# Patient Record
Sex: Male | Born: 1980 | Race: White | Hispanic: No | Marital: Single | Smoking: Former smoker
Health system: Southern US, Community
[De-identification: ages and names within clinical notes are randomized; demographics above are authoritative.]

## PROBLEM LIST (undated history)

## (undated) DIAGNOSIS — F419 Anxiety disorder, unspecified: Secondary | ICD-10-CM

## (undated) DIAGNOSIS — F410 Panic disorder [episodic paroxysmal anxiety] without agoraphobia: Secondary | ICD-10-CM

## (undated) DIAGNOSIS — F32A Depression, unspecified: Secondary | ICD-10-CM

## (undated) DIAGNOSIS — F329 Major depressive disorder, single episode, unspecified: Secondary | ICD-10-CM

## (undated) DIAGNOSIS — E7800 Pure hypercholesterolemia, unspecified: Secondary | ICD-10-CM

## (undated) DIAGNOSIS — I10 Essential (primary) hypertension: Secondary | ICD-10-CM

## (undated) DIAGNOSIS — G629 Polyneuropathy, unspecified: Secondary | ICD-10-CM

## (undated) HISTORY — DX: Major depressive disorder, single episode, unspecified: F32.9

## (undated) HISTORY — DX: Pure hypercholesterolemia, unspecified: E78.00

## (undated) HISTORY — DX: Anxiety disorder, unspecified: F41.9

## (undated) HISTORY — DX: Essential (primary) hypertension: I10

## (undated) HISTORY — DX: Depression, unspecified: F32.A

---

## 2005-11-23 ENCOUNTER — Emergency Department: Payer: Self-pay | Admitting: Emergency Medicine

## 2007-09-03 ENCOUNTER — Ambulatory Visit: Payer: Self-pay | Admitting: Internal Medicine

## 2007-10-01 ENCOUNTER — Ambulatory Visit: Payer: Self-pay | Admitting: Internal Medicine

## 2008-04-20 ENCOUNTER — Ambulatory Visit: Payer: Self-pay | Admitting: Family Medicine

## 2008-07-12 ENCOUNTER — Ambulatory Visit: Payer: Self-pay | Admitting: Family Medicine

## 2008-12-19 ENCOUNTER — Ambulatory Visit: Payer: Self-pay | Admitting: Internal Medicine

## 2009-10-25 ENCOUNTER — Emergency Department: Payer: Self-pay | Admitting: Emergency Medicine

## 2011-02-19 ENCOUNTER — Inpatient Hospital Stay: Payer: Self-pay | Admitting: Psychiatry

## 2011-02-20 DIAGNOSIS — Z0389 Encounter for observation for other suspected diseases and conditions ruled out: Secondary | ICD-10-CM

## 2012-09-04 ENCOUNTER — Ambulatory Visit: Payer: Self-pay | Admitting: Family Medicine

## 2012-10-05 ENCOUNTER — Ambulatory Visit: Payer: Self-pay | Admitting: Family Medicine

## 2012-10-05 LAB — RAPID STREP-A WITH REFLX: Micro Text Report: NEGATIVE

## 2014-07-21 ENCOUNTER — Other Ambulatory Visit: Payer: Self-pay | Admitting: Psychiatry

## 2014-11-05 ENCOUNTER — Encounter: Payer: Self-pay | Admitting: Psychiatry

## 2014-11-05 ENCOUNTER — Ambulatory Visit (INDEPENDENT_AMBULATORY_CARE_PROVIDER_SITE_OTHER): Payer: Self-pay | Admitting: Psychiatry

## 2014-11-05 VITALS — BP 120/90 | HR 60 | Resp 12 | Ht 73.0 in

## 2014-11-05 DIAGNOSIS — F331 Major depressive disorder, recurrent, moderate: Secondary | ICD-10-CM

## 2014-11-05 MED ORDER — DIAZEPAM 5 MG PO TABS: ORAL_TABLET | ORAL | Status: DC

## 2014-11-05 MED ORDER — ZALEPLON 10 MG PO CAPS: 10.0000 mg | ORAL_CAPSULE | Freq: Every evening | ORAL | Status: DC | PRN

## 2014-11-05 MED ORDER — CITALOPRAM HYDROBROMIDE 40 MG PO TABS: 40.0000 mg | ORAL_TABLET | Freq: Every day | ORAL | Status: DC

## 2014-11-05 MED ORDER — QUETIAPINE FUMARATE 400 MG PO TABS: 400.0000 mg | ORAL_TABLET | Freq: Every day | ORAL | Status: DC

## 2014-11-05 NOTE — Progress Notes (Signed)
BH MD/PA/NP OP Progress Note  11/05/2014 10:02 AM Ian [name]  MRN:  [id number]  Subjective:  Ian [name] returns a follow-up is major depressive disorder, recurrent, moderate. He states since we increased his Celexa from 20 [place] today to 40 mg at the last visit he notes his mood is "a lot better." He states he is now engaging more being a little more involved in things and somewhat able to enjoy things more.  He states his biggest issue right now is difficulty sleeping. He states that the trazodone does not help him fall asleep but helps him stay asleep. He states he thinks the Seroquel is the most helpful in terms of helping him sleep but he would like to eventually get off this medication as he is concerned with the weight gain he has had on the medication. We have discussed that we might do to try other sleep medications. He states Ambien was not effective for him in the past. We discussed perhaps trying Sonata in place of the trazodone and seeing if we successful with another sleep agent then we might be able to wean him off Seroquel.  Chief Complaint: sleep Visit Diagnosis:  No diagnosis found.  Past Medical History: No past medical history on file. No past surgical history on file. Family History: No family history on file. Social History:  History   Social History  . Marital Status: Single    Spouse Name: N/A  . Number of Children: N/A  . Years of Education: N/A   Social History Main Topics  . Smoking status: Not on file  . Smokeless tobacco: Not on file  . Alcohol Use: Not on file  . Drug Use: Not on file  . Sexual Activity: No   Other Topics Concern  . Not on file   Social History Narrative  . No narrative on file   Additional History:   Assessment:   Musculoskeletal: Strength & Muscle Tone: within normal limits Gait & Station: normal Patient leans: N/A  Psychiatric Specialty Exam: HPI  Review of Systems  Psychiatric/Behavioral: Negative for depression,  suicidal ideas, hallucinations, memory loss and substance abuse. The patient has insomnia. The patient is not nervous/anxious.     Blood pressure 120/90, pulse 60, resp. rate 12, height 6\' 1"  (1.854 m).There is no weight on file to calculate BMI.  General Appearance: Well Groomed  Eye Contact:  Good  Speech:  Clear and Coherent and Normal Rate  Volume:  Normal  Mood:  A lot better  Affect:  Congruent  Thought Process:  Linear and Logical  Orientation:  Full (Time, Place, and Person)  Thought Content:  Negative  Suicidal Thoughts:  No  Homicidal Thoughts:  No  Memory:  Immediate;   Good Recent;   Good Remote;   Good  Judgement:  Good  Insight:  Good  Psychomotor Activity:  Negative  Concentration:  Good  Recall:  Good  Fund of Knowledge: Good  Language: Good  Akathisia:  Negative  Handed:  Rightunknown  AIMS (if indicated):  Not done  Assets:  Communication Skills Desire for Improvement Social Support  ADL's:  Intact  Cognition: WNL  Sleep:  Poor, good with medication   Is the patient at risk to self?  No. Has the patient been a risk to self in the past 6 months?  No. Has the patient been a risk to self within the distant past?  No. Is the patient a risk to others?  No. Has the patient been a  risk to others in the past 6 months?  No. Has the patient been a risk to others within the distant past?  No.  Current Medications: Current Outpatient Prescriptions  Medication Sig Dispense Refill  . citalopram (CELEXA) 40 MG tablet Take 1 tablet (40 mg total) by mouth daily. 30 tablet 3  . QUEtiapine (SEROQUEL) 400 MG tablet Take 1 tablet (400 mg total) by mouth at bedtime. 30 tablet 3  . traZODone (DESYREL) 100 MG tablet Take 200 mg by mouth at bedtime.    . diazepam (VALIUM) 5 MG tablet Take 2 tablets in the morning, 2 tablets in the afternoon and 1 tablet at bedtime 150 tablet 1  . zaleplon (SONATA) 10 MG capsule Take 1 capsule (10 mg total) by mouth at bedtime as needed for  sleep. 30 capsule 1   No current facility-administered medications for this visit.    Medical Decision Making:  Established Problem, Stable/Improving (1) patient is doing well on the current regimen. His only issue right now is insomnia and a desire to adjust the sleep regimen to decrease side effects from weight gain related to Seroquel and find alternative medications to aid him in his insomnia.  Treatment Plan Summary:Medication management We will continue his diazepam at 10 mg in the morning, 10 mg in the afternoon and 5 mg at bedtime. We'll continue his citalopram at 40 mg daily. We will try Sonata 10 mg at bedtime as needed for insomnia. He will discontinue his trazodone and see if he can get a response from the [facility]. He will follow up in 1 month.  Ian [name] 11/05/2014, 10:02 AM

## 2014-12-08 ENCOUNTER — Encounter: Payer: Self-pay | Admitting: Psychiatry

## 2014-12-08 ENCOUNTER — Ambulatory Visit (INDEPENDENT_AMBULATORY_CARE_PROVIDER_SITE_OTHER): Payer: Self-pay | Admitting: Psychiatry

## 2014-12-08 VITALS — BP 142/98 | HR 118 | Temp 98.3°F | Ht 73.0 in | Wt 357.4 lb

## 2014-12-08 DIAGNOSIS — F331 Major depressive disorder, recurrent, moderate: Secondary | ICD-10-CM

## 2014-12-08 DIAGNOSIS — F4001 Agoraphobia with panic disorder: Secondary | ICD-10-CM

## 2014-12-08 MED ORDER — TRAZODONE HCL 100 MG PO TABS: 200.0000 mg | ORAL_TABLET | Freq: Every evening | ORAL | Status: DC | PRN

## 2014-12-08 MED ORDER — DIAZEPAM 5 MG PO TABS: 5.0000 mg | ORAL_TABLET | Freq: Four times a day (QID) | ORAL | Status: DC | PRN

## 2014-12-08 NOTE — Progress Notes (Signed)
BH MD/PA/NP OP Progress Note  12/08/2014 10:39 AM Ian [name]  MRN:  [id number]  Subjective:  Patient returns for follow-up of his major depressive disorder and anxiety. He states that overall he does feel like his depression has improved and is somewhat stable. He states he has issues with his [place] phobia and surmises he is just an "introvert." He states he's been more active around his house. He states he is able to enjoy things. He states he is sleeping fairly well however he did not find Sonata helped him much with his insomnia so he used to prior refill with trazodone and feels this medication has worked well for him. He estimates he sleeping about 8 hours a night.  Cuss his Valium and I indicated ultimately like him to be on a lower dose. Thus we admitted decision that we will decrease his dose from 25 mg total a day to 20 mg total day. He will have 5 mg tablets and will be instructed to take 14 times a day as needed for his anxiety.  Chief Complaint: sleep Visit Diagnosis:  No diagnosis found.  Past Medical History:  Past Medical History  Diagnosis Date  . Hypertension   . Hypercholesteremia   . Anxiety   . Depression    History reviewed. No pertinent past surgical history. Family History:  Family History  Problem Relation Age of Onset  . Heart attack Mother   . Hypertension Mother   . Hypercholesterolemia Mother   . Heart attack Father   . Hypertension Father   . Hypercholesterolemia Father   . Depression Maternal Uncle   . Suicidality Maternal Uncle   . Depression Cousin   . Suicidality Cousin    Social History:  History   Social History  . Marital Status: Single    Spouse Name: N/A  . Number of Children: N/A  . Years of Education: N/A   Social History Main Topics  . Smoking status: Current Every Day Smoker -- 0.75 packs/day for 16 years    Types: Cigarettes  . Smokeless tobacco: Not on file  . Alcohol Use: No  . Drug Use: No  . Sexual Activity: No    Other Topics Concern  . None   Social History Narrative   Additional History:   Assessment:   Musculoskeletal: Strength & Muscle Tone: within normal limits Gait & Station: normal Patient leans: N/A  Psychiatric Specialty Exam: HPI  Review of Systems  Psychiatric/Behavioral: Negative for depression, suicidal ideas, hallucinations, memory loss and substance abuse. The patient has insomnia. The patient is not nervous/anxious.     Blood pressure 140/92, pulse 118, temperature 98.3 F (36.8 C), temperature source Tympanic, height 6\' 1"  (1.854 m), weight 357 lb 6.4 oz (162.116 kg), SpO2 93 %.Body mass index is 47.16 kg/(m^2).  General Appearance: Well Groomed  Eye Contact:  Good  Speech:  Clear and Coherent and Normal Rate  Volume:  Normal  Mood:  A lot better  Affect:  Congruent  Thought Process:  Linear and Logical  Orientation:  Full (Time, Place, and Person)  Thought Content:  Negative  Suicidal Thoughts:  No  Homicidal Thoughts:  No  Memory:  Immediate;   Good Recent;   Good Remote;   Good  Judgement:  Good  Insight:  Good  Psychomotor Activity:  Negative  Concentration:  Good  Recall:  Good  Fund of Knowledge: Good  Language: Good  Akathisia:  Negative  Handed:  Rightunknown  AIMS (if indicated):  Not  done  Assets:  Communication Skills Desire for Improvement Social Support  ADL's:  Intact  Cognition: WNL  Sleep:  Poor, good with medication   Is the patient at risk to self?  No. Has the patient been a risk to self in the past 6 months?  No. Has the patient been a risk to self within the distant past?  No. Is the patient a risk to others?  No. Has the patient been a risk to others in the past 6 months?  No. Has the patient been a risk to others within the distant past?  No.  Current Medications: Current Outpatient Prescriptions  Medication Sig Dispense Refill  . citalopram (CELEXA) 40 MG tablet Take 1 tablet (40 mg total) by mouth daily. 30 tablet 3   . diazepam (VALIUM) 5 MG tablet Take 1 tablet (5 mg total) by mouth every 6 (six) hours as needed for anxiety. 120 tablet 1  . QUEtiapine (SEROQUEL) 400 MG tablet Take 1 tablet (400 mg total) by mouth at bedtime. 30 tablet 3  . traZODone (DESYREL) 100 MG tablet Take 2 tablets (200 mg total) by mouth at bedtime as needed for sleep. 60 tablet 2  . zaleplon (SONATA) 10 MG capsule Take 1 capsule (10 mg total) by mouth at bedtime as needed for sleep. (Patient not taking: Reported on 12/08/2014) 30 capsule 1   No current facility-administered medications for this visit.    Medical Decision Making:  Established Problem, Stable/Improving (1) patient is doing well on the current regimen. His only issue right now is insomnia and a desire to adjust the sleep regimen to decrease side effects from weight gain related to Seroquel and find alternative medications to aid him in his insomnia.  Treatment Plan Summary:Medication management We will decrease his diazepam to 5 mg 4 times a day as needed. We'll continue his citalopram at 40 mg daily. We'll continue his Seroquel 400 mg at bedtime. He is a very discontinued the Sonata. I will restart him back on his prior dose of trazodone which was 200 mg at bedtime. He will follow-up in 2 months. He has 2 refills remaining on all of his medications with the exception of needing his trazodone. I will order a 30 day supply of trazodone with 2 refills. I will also give him a new prescription for his diazepam at the lower dose.  Patient has notable mildly elevated blood pressure. We have given him numerous referrals to primary care offices to have this followed up and assess. Patient is aware he needs to connect with a primary care and mentioned in this appointment that financially he is trying to make that happen and that following up here in 2 months makes that more likely a possibility for him.  Wallace Going 12/08/2014, 10:39 AM

## 2014-12-10 ENCOUNTER — Telehealth: Payer: Self-pay

## 2014-12-10 NOTE — Telephone Encounter (Signed)
called pt to follow up with his blood pressure being hight on his visit 12-08-14. at that visit I did advise pt to try several doctor office in the area that worked on a sliding fee since pt does not have insurance. pt was advised that he should see someone about his blood pressure. Pt BP was 140/ 92  And 142/98. Pt was strongly encouraged to check and keep a record of his blood pressure reading

## 2014-12-15 NOTE — Telephone Encounter (Signed)
left message to call back. concern about bp issues and wonder if pt found a pcp to go too.

## 2015-01-13 ENCOUNTER — Telehealth: Payer: Self-pay | Admitting: Psychiatry

## 2015-01-13 NOTE — Telephone Encounter (Signed)
pt was called and informed that letter was ready and at the front desk

## 2015-02-09 ENCOUNTER — Ambulatory Visit (INDEPENDENT_AMBULATORY_CARE_PROVIDER_SITE_OTHER): Payer: Self-pay | Admitting: Psychiatry

## 2015-02-09 ENCOUNTER — Encounter: Payer: Self-pay | Admitting: Psychiatry

## 2015-02-09 VITALS — BP 130/98 | HR 97 | Temp 98.0°F | Ht 73.0 in | Wt 362.6 lb

## 2015-02-09 DIAGNOSIS — F3342 Major depressive disorder, recurrent, in full remission: Secondary | ICD-10-CM | POA: Insufficient documentation

## 2015-02-09 DIAGNOSIS — F331 Major depressive disorder, recurrent, moderate: Secondary | ICD-10-CM | POA: Insufficient documentation

## 2015-02-09 DIAGNOSIS — F4001 Agoraphobia with panic disorder: Secondary | ICD-10-CM | POA: Insufficient documentation

## 2015-02-09 MED ORDER — QUETIAPINE FUMARATE 400 MG PO TABS: 400.0000 mg | ORAL_TABLET | Freq: Every day | ORAL | Status: DC

## 2015-02-09 MED ORDER — DIAZEPAM 5 MG PO TABS: 5.0000 mg | ORAL_TABLET | Freq: Four times a day (QID) | ORAL | Status: DC | PRN

## 2015-02-09 MED ORDER — CITALOPRAM HYDROBROMIDE 40 MG PO TABS: 40.0000 mg | ORAL_TABLET | Freq: Every day | ORAL | Status: DC

## 2015-02-09 MED ORDER — TRAZODONE HCL 100 MG PO TABS: 200.0000 mg | ORAL_TABLET | Freq: Every evening | ORAL | Status: DC | PRN

## 2015-02-09 NOTE — Progress Notes (Signed)
BH MD/PA/NP OP Progress Note  02/09/2015 10:53 AM Ian [name]  MRN:  [id number]  Subjective:  Patient returns for follow-up of his major depressive disorder and anxiety. He said his depression is under good control that his main issue now is anxiety. He states he left his house about 3 times in the past 2 months. He states that his anxiety worsens in social situations. He states he does get relief from his Valium. We spent time discussing a plan to take further decrease the Valium however we would likely need a substitute for Klonopin. I did discuss that the ideal situation for him to be comfortable without these medicines would be for him to engage in some cognitive behavioral therapy related to anxiety. Patient stresses finances and transportation as barriers to engaging in regular and compliant therapy.  Thus that given that he he is stable at this time that we could continue the Valium without further decrease but that the goal ultimately needs to be for him to start to engage in therapy to have other coping mechanisms for his anxiety.  He relates he is sleeping well. He does state that at some point he might want to come off the Seroquel but is concerned that if he stops this he will not sleep well.  Chief Complaint:  Chief Complaint    Follow-up; Anxiety; Panic Attack    sleep Visit Diagnosis:  No diagnosis found.  Past Medical History:  Past Medical History  Diagnosis Date  . Hypertension   . Hypercholesteremia   . Anxiety   . Depression    History reviewed. No pertinent past surgical history. Family History:  Family History  Problem Relation Age of Onset  . Heart attack Mother   . Hypertension Mother   . Hypercholesterolemia Mother   . Heart attack Father   . Hypertension Father   . Hypercholesterolemia Father   . Depression Maternal Uncle   . Suicidality Maternal Uncle   . Depression Cousin   . Suicidality Cousin    Social History:  Social History   Social  History  . Marital Status: Single    Spouse Name: N/A  . Number of Children: N/A  . Years of Education: N/A   Social History Main Topics  . Smoking status: Current Every Day Smoker -- 0.75 packs/day for 16 years    Types: Cigarettes  . Smokeless tobacco: Never Used  . Alcohol Use: No  . Drug Use: No  . Sexual Activity: No   Other Topics Concern  . None   Social History Narrative   Additional History:   Assessment:   Musculoskeletal: Strength & Muscle Tone: within normal limits Gait & Station: normal Patient leans: N/A  Psychiatric Specialty Exam: Anxiety Symptoms include nervous/anxious behavior. Patient reports no insomnia or suicidal ideas.      Review of Systems  Psychiatric/Behavioral: Negative for depression, suicidal ideas, hallucinations, memory loss and substance abuse. The patient is nervous/anxious. The patient does not have insomnia.     Blood pressure 130/98, pulse 97, temperature 98 F (36.7 C), temperature source Tympanic, height  (1.854 m), weight 362 lb 9.6 oz (164.474 kg), SpO2 93 %.Body mass index is 47.85 kg/(m^2).  General Appearance: Well Groomed  Eye Contact:  Good  Speech:  Clear and Coherent and Normal Rate  Volume:  Normal  Mood:  Good  Affect:  Congruent  Thought Process:  Linear and Logical  Orientation:  Full (Time, Place, and Person)  Thought Content:  Negative  Suicidal  Thoughts:  No  Homicidal Thoughts:  No  Memory:  Immediate;   Good Recent;   Good Remote;   Good  Judgement:  Good  Insight:  Good  Psychomotor Activity:  Negative  Concentration:  Good  Recall:  Good  Fund of Knowledge: Good  Language: Good  Akathisia:  Negative  Handed:  Rightunknown  AIMS (if indicated):  Not done  Assets:  Communication Skills Desire for Improvement Social Support  ADL's:  Intact  Cognition: WNL  Sleep:  Poor, good with medication   Is the patient at risk to self?  No. Has the patient been a risk to self in the past 6 months?   No. Has the patient been a risk to self within the distant past?  No. Is the patient a risk to others?  No. Has the patient been a risk to others in the past 6 months?  No. Has the patient been a risk to others within the distant past?  No.  Current Medications: Current Outpatient Prescriptions  Medication Sig Dispense Refill  . atorvastatin (LIPITOR) 80 MG tablet Take 80 mg by mouth daily.    . citalopram (CELEXA) 40 MG tablet Take 1 tablet (40 mg total) by mouth daily. 30 tablet 3  . diazepam (VALIUM) 5 MG tablet Take 1 tablet (5 mg total) by mouth every 6 (six) hours as needed for anxiety. 120 tablet 3  . propranolol (INDERAL) 40 MG tablet Take 40 mg by mouth 3 (three) times daily.    . QUEtiapine (SEROQUEL) 400 MG tablet Take 1 tablet (400 mg total) by mouth at bedtime. 30 tablet 3  . traZODone (DESYREL) 100 MG tablet Take 2 tablets (200 mg total) by mouth at bedtime as needed for sleep. 60 tablet 3  . zaleplon (SONATA) 10 MG capsule Take 1 capsule (10 mg total) by mouth at bedtime as needed for sleep. (Patient not taking: Reported on 02/09/2015) 30 capsule 1   No current facility-administered medications for this visit.    Medical Decision Making:  Established Problem, Stable/Improving (1) patient is doing well on the current regimen. His only issue right now is insomnia and a desire to adjust the sleep regimen to decrease side effects from weight gain related to Seroquel and find alternative medications to aid him in his insomnia.  Treatment Plan Summary:Medication management We'll continue his Celexa 40 mg daily. We'll continue his diazepam at 5 mg  6 hours as needed for anxiety. He will continue his Seroquel 400 mg at bedtime.we'll continue his trazodone 200 mg at bedtime. Patient indicates that his primary care has started him on propranolol as well as atorvastatin.  Major depressive disorder, recurrent, moderate-continue Celexa with augmentation with Seroquel.  Panic Disorder  with agoraphobia-continue Celexa and diazepam as above. Encouraged Jae Dire patient to engage in cognitive behavioral therapy.     Wallace Going 02/09/2015, 10:53 AM

## 2015-03-23 ENCOUNTER — Inpatient Hospital Stay: Payer: Self-pay

## 2015-03-23 ENCOUNTER — Inpatient Hospital Stay
Admit: 2015-03-23 | Discharge: 2015-03-23 | Disposition: A | Discharge status: Home / Self Care | Payer: Self-pay | Attending: Internal Medicine | Admitting: Internal Medicine

## 2015-03-23 ENCOUNTER — Emergency Department: Payer: Self-pay

## 2015-03-23 ENCOUNTER — Inpatient Hospital Stay
Admission: EM | Admit: 2015-03-23 | Discharge: 2015-03-24 | DRG: 917 | Disposition: A | Discharge status: Home / Self Care | Payer: Self-pay | Attending: Internal Medicine | Admitting: Internal Medicine

## 2015-03-23 DIAGNOSIS — F331 Major depressive disorder, recurrent, moderate: Secondary | ICD-10-CM | POA: Diagnosis present

## 2015-03-23 DIAGNOSIS — F1011 Alcohol abuse, in remission: Secondary | ICD-10-CM

## 2015-03-23 DIAGNOSIS — T50904A Poisoning by unspecified drugs, medicaments and biological substances, undetermined, initial encounter: Principal | ICD-10-CM | POA: Diagnosis present

## 2015-03-23 DIAGNOSIS — R410 Disorientation, unspecified: Secondary | ICD-10-CM

## 2015-03-23 DIAGNOSIS — I10 Essential (primary) hypertension: Secondary | ICD-10-CM | POA: Diagnosis present

## 2015-03-23 DIAGNOSIS — E7800 Pure hypercholesterolemia, unspecified: Secondary | ICD-10-CM | POA: Diagnosis present

## 2015-03-23 DIAGNOSIS — R5383 Other fatigue: Secondary | ICD-10-CM

## 2015-03-23 DIAGNOSIS — R404 Transient alteration of awareness: Secondary | ICD-10-CM

## 2015-03-23 DIAGNOSIS — R4701 Aphasia: Secondary | ICD-10-CM | POA: Diagnosis present

## 2015-03-23 DIAGNOSIS — L03116 Cellulitis of left lower limb: Secondary | ICD-10-CM | POA: Diagnosis present

## 2015-03-23 DIAGNOSIS — Z8349 Family history of other endocrine, nutritional and metabolic diseases: Secondary | ICD-10-CM

## 2015-03-23 DIAGNOSIS — D72828 Other elevated white blood cell count: Secondary | ICD-10-CM | POA: Diagnosis present

## 2015-03-23 DIAGNOSIS — F13929 Sedative, hypnotic or anxiolytic use, unspecified with intoxication, unspecified: Secondary | ICD-10-CM

## 2015-03-23 DIAGNOSIS — Z818 Family history of other mental and behavioral disorders: Secondary | ICD-10-CM

## 2015-03-23 DIAGNOSIS — R079 Chest pain, unspecified: Secondary | ICD-10-CM

## 2015-03-23 DIAGNOSIS — R401 Stupor: Secondary | ICD-10-CM

## 2015-03-23 DIAGNOSIS — I639 Cerebral infarction, unspecified: Secondary | ICD-10-CM | POA: Diagnosis present

## 2015-03-23 DIAGNOSIS — R4182 Altered mental status, unspecified: Secondary | ICD-10-CM | POA: Diagnosis present

## 2015-03-23 DIAGNOSIS — Z8249 Family history of ischemic heart disease and other diseases of the circulatory system: Secondary | ICD-10-CM

## 2015-03-23 DIAGNOSIS — F411 Generalized anxiety disorder: Secondary | ICD-10-CM

## 2015-03-23 DIAGNOSIS — F4001 Agoraphobia with panic disorder: Secondary | ICD-10-CM | POA: Diagnosis present

## 2015-03-23 DIAGNOSIS — E785 Hyperlipidemia, unspecified: Secondary | ICD-10-CM | POA: Diagnosis present

## 2015-03-23 DIAGNOSIS — W19XXXA Unspecified fall, initial encounter: Secondary | ICD-10-CM | POA: Diagnosis present

## 2015-03-23 DIAGNOSIS — G92 Toxic encephalopathy: Secondary | ICD-10-CM | POA: Diagnosis present

## 2015-03-23 DIAGNOSIS — F1320 Sedative, hypnotic or anxiolytic dependence, uncomplicated: Secondary | ICD-10-CM

## 2015-03-23 DIAGNOSIS — F17210 Nicotine dependence, cigarettes, uncomplicated: Secondary | ICD-10-CM | POA: Diagnosis present

## 2015-03-23 DIAGNOSIS — Y929 Unspecified place or not applicable: Secondary | ICD-10-CM

## 2015-03-23 DIAGNOSIS — R569 Unspecified convulsions: Secondary | ICD-10-CM | POA: Diagnosis present

## 2015-03-23 DIAGNOSIS — F410 Panic disorder [episodic paroxysmal anxiety] without agoraphobia: Secondary | ICD-10-CM

## 2015-03-23 DIAGNOSIS — R112 Nausea with vomiting, unspecified: Secondary | ICD-10-CM | POA: Diagnosis present

## 2015-03-23 LAB — CBC WITH DIFFERENTIAL/PLATELET
Basophils Absolute: 0.1 10*3/uL (ref 0–0.1)
Basophils Relative: 1 %
Eosinophils Absolute: 0 10*3/uL (ref 0–0.7)
Eosinophils Relative: 0 %
HEMATOCRIT: 45.3 % (ref 40.0–52.0)
HEMOGLOBIN: 15.3 g/dL (ref 13.0–18.0)
LYMPHS ABS: 1.9 10*3/uL (ref 1.0–3.6)
Lymphocytes Relative: 12 %
MCH: 32.3 pg (ref 26.0–34.0)
MCHC: 33.8 g/dL (ref 32.0–36.0)
MCV: 95.7 fL (ref 80.0–100.0)
MONOS PCT: 3 %
Monocytes Absolute: 0.5 10*3/uL (ref 0.2–1.0)
NEUTROS ABS: 12.8 10*3/uL — AB (ref 1.4–6.5)
NEUTROS PCT: 84 %
Platelets: 254 10*3/uL (ref 150–440)
RBC: 4.73 MIL/uL (ref 4.40–5.90)
RDW: 14.7 % — ABNORMAL HIGH (ref 11.5–14.5)
WBC: 15.3 10*3/uL — ABNORMAL HIGH (ref 3.8–10.6)

## 2015-03-23 LAB — MAGNESIUM: MAGNESIUM: 2.1 mg/dL (ref 1.7–2.4)

## 2015-03-23 LAB — URINALYSIS COMPLETE WITH MICROSCOPIC (ARMC ONLY)
Bilirubin Urine: NEGATIVE
Glucose, UA: NEGATIVE mg/dL
Hgb urine dipstick: NEGATIVE
KETONES UR: NEGATIVE mg/dL
Leukocytes, UA: NEGATIVE
Nitrite: NEGATIVE
PH: 5 (ref 5.0–8.0)
PROTEIN: 30 mg/dL — AB
Specific Gravity, Urine: 1.015 (ref 1.005–1.030)

## 2015-03-23 LAB — COMPREHENSIVE METABOLIC PANEL
ALBUMIN: 4.4 g/dL (ref 3.5–5.0)
ALT: 29 U/L (ref 17–63)
AST: 37 U/L (ref 15–41)
Alkaline Phosphatase: 101 U/L (ref 38–126)
Anion gap: 15 (ref 5–15)
BILIRUBIN TOTAL: 0.4 mg/dL (ref 0.3–1.2)
BUN: 14 mg/dL (ref 6–20)
CHLORIDE: 102 mmol/L (ref 101–111)
CO2: 24 mmol/L (ref 22–32)
CREATININE: 1.17 mg/dL (ref 0.61–1.24)
Calcium: 9.7 mg/dL (ref 8.9–10.3)
GFR calc Af Amer: >60 mL/min (ref >60)
GFR calc non Af Amer: >60 mL/min (ref >60)
GLUCOSE: 149 mg/dL — AB (ref 65–99)
POTASSIUM: 3.9 mmol/L (ref 3.5–5.1)
Sodium: 141 mmol/L (ref 135–145)
Total Protein: 7.8 g/dL (ref 6.5–8.1)

## 2015-03-23 LAB — BLOOD GAS, ARTERIAL
ALLENS TEST (PASS/FAIL): POSITIVE — AB
Acid-base deficit: 0.7 mmol/L (ref 0.0–2.0)
BICARBONATE: 25.8 meq/L (ref 21.0–28.0)
FIO2: 0.36
O2 SAT: 97.1 %
PO2 ART: 98 mmHg (ref 83.0–108.0)
Patient temperature: 37
pCO2 arterial: 49 mmHg — ABNORMAL HIGH (ref 32.0–48.0)
pH, Arterial: 7.33 — ABNORMAL LOW (ref 7.350–7.450)

## 2015-03-23 LAB — TROPONIN I: Troponin I: <0.03 ng/mL (ref <0.031)

## 2015-03-23 LAB — SALICYLATE LEVEL: Salicylate Lvl: <4 mg/dL (ref 2.8–30.0)

## 2015-03-23 LAB — ACETAMINOPHEN LEVEL

## 2015-03-23 LAB — URINE DRUG SCREEN, QUALITATIVE (ARMC ONLY)
AMPHETAMINES, UR SCREEN: NOT DETECTED
BENZODIAZEPINE, UR SCRN: POSITIVE — AB
Barbiturates, Ur Screen: NOT DETECTED
Cannabinoid 50 Ng, Ur ~~LOC~~: NOT DETECTED
Cocaine Metabolite,Ur ~~LOC~~: NOT DETECTED
MDMA (Ecstasy)Ur Screen: NOT DETECTED
METHADONE SCREEN, URINE: NOT DETECTED
Opiate, Ur Screen: NOT DETECTED
PHENCYCLIDINE (PCP) UR S: NOT DETECTED
Tricyclic, Ur Screen: NOT DETECTED

## 2015-03-23 LAB — PHOSPHORUS: Phosphorus: 3.6 mg/dL (ref 2.5–4.6)

## 2015-03-23 LAB — ETHANOL: Alcohol, Ethyl (B): <5 mg/dL (ref <5)

## 2015-03-23 LAB — MRSA PCR SCREENING: MRSA by PCR: NEGATIVE

## 2015-03-23 LAB — OSMOLALITY: OSMOLALITY: 295 mosm/kg (ref 275–295)

## 2015-03-23 MED ORDER — TRAZODONE HCL 50 MG PO TABS
50.0000 mg | ORAL_TABLET | Freq: Every evening | ORAL | Status: DC | PRN
  Administered 2015-03-23: 50 mg via ORAL
  Filled 2015-03-23: qty 1

## 2015-03-23 MED ORDER — KCL IN DEXTROSE-NACL 20-5-0.9 MEQ/L-%-% IV SOLN
INTRAVENOUS | Status: AC
  Administered 2015-03-23 – 2015-03-24 (×2): via INTRAVENOUS
  Filled 2015-03-23 (×2): qty 1000

## 2015-03-23 MED ORDER — METOPROLOL TARTRATE 1 MG/ML IV SOLN: 5.0000 mg | Freq: Four times a day (QID) | INTRAVENOUS | Status: DC | PRN

## 2015-03-23 MED ORDER — ACETAMINOPHEN 650 MG RE SUPP: 650.0000 mg | Freq: Four times a day (QID) | RECTAL | Status: DC | PRN

## 2015-03-23 MED ORDER — INFLUENZA VAC SPLIT QUAD 0.5 ML IM SUSY
0.5000 mL | PREFILLED_SYRINGE | INTRAMUSCULAR | Status: AC
  Administered 2015-03-24: 0.5 mL via INTRAMUSCULAR
  Filled 2015-03-23: qty 0.5

## 2015-03-23 MED ORDER — GADOBENATE DIMEGLUMINE 529 MG/ML IV SOLN
20.0000 mL | Freq: Once | INTRAVENOUS | Status: AC | PRN
  Administered 2015-03-23: 20 mL via INTRAVENOUS

## 2015-03-23 MED ORDER — ONDANSETRON HCL 4 MG/2ML IJ SOLN: 4.0000 mg | Freq: Four times a day (QID) | INTRAMUSCULAR | Status: DC | PRN

## 2015-03-23 MED ORDER — SODIUM CHLORIDE 0.9 % IJ SOLN
3.0000 mL | Freq: Two times a day (BID) | INTRAMUSCULAR | Status: DC
  Administered 2015-03-23 – 2015-03-24 (×2): 3 mL via INTRAVENOUS

## 2015-03-23 MED ORDER — ACETAMINOPHEN 325 MG PO TABS
650.0000 mg | ORAL_TABLET | Freq: Four times a day (QID) | ORAL | Status: DC | PRN
  Administered 2015-03-24: 650 mg via ORAL
  Filled 2015-03-23: qty 2

## 2015-03-23 MED ORDER — ENOXAPARIN SODIUM 40 MG/0.4ML ~~LOC~~ SOLN
40.0000 mg | Freq: Two times a day (BID) | SUBCUTANEOUS | Status: DC
  Administered 2015-03-23 – 2015-03-24 (×2): 40 mg via SUBCUTANEOUS
  Filled 2015-03-23 (×2): qty 0.4

## 2015-03-23 MED ORDER — PANTOPRAZOLE SODIUM 40 MG IV SOLR
40.0000 mg | INTRAVENOUS | Status: DC
  Administered 2015-03-23: 40 mg via INTRAVENOUS
  Filled 2015-03-23: qty 40

## 2015-03-23 MED ORDER — ONDANSETRON HCL 4 MG PO TABS: 4.0000 mg | ORAL_TABLET | Freq: Four times a day (QID) | ORAL | Status: DC | PRN

## 2015-03-23 NOTE — Consult Note (Signed)
Reason for Consult: alerted mental status Referring Physician: Dr. Macon Large is an 34 y.o. male.  HPI: 34 yo RHD M who was found laying in the backyard by neighbors.  Pt has been unarousable per primary MD and ER staff.  No unusual behaviors noted.  Pt does not really complain of anything once awake.  Past Medical History  Diagnosis Date  . Hypertension   . Hypercholesteremia   . Anxiety   . Depression     No past surgical history on file.  Family History  Problem Relation Age of Onset  . Heart attack Mother   . Hypertension Mother   . Hypercholesterolemia Mother   . Heart attack Father   . Hypertension Father   . Hypercholesterolemia Father   . Depression Maternal Uncle   . Suicidality Maternal Uncle   . Depression Cousin   . Suicidality Cousin     Social History:  reports that he has been smoking Cigarettes.  He has a 12 pack-year smoking history. He has never used smokeless tobacco. He reports that he does not drink alcohol or use illicit drugs.  Allergies: No Known Allergies  Medications: personally reviewed by me in chart  Results for orders placed or performed during the hospital encounter of 03/23/15 (from the past 48 hour(s))  Acetaminophen level     Status: Abnormal   Collection Time: 03/23/15  7:42 AM  Result Value Ref Range   Acetaminophen (Tylenol), Serum <10 (L) 10 - 30 ug/mL    Comment:        THERAPEUTIC CONCENTRATIONS VARY SIGNIFICANTLY. A RANGE OF 10-30 ug/mL MAY BE AN EFFECTIVE CONCENTRATION FOR MANY PATIENTS. HOWEVER, SOME ARE BEST TREATED AT CONCENTRATIONS OUTSIDE THIS RANGE. ACETAMINOPHEN CONCENTRATIONS >150 ug/mL AT 4 HOURS AFTER INGESTION AND >50 ug/mL AT 12 HOURS AFTER INGESTION ARE OFTEN ASSOCIATED WITH TOXIC REACTIONS.   Comprehensive metabolic panel     Status: Abnormal   Collection Time: 03/23/15  7:42 AM  Result Value Ref Range   Sodium 141 135 - 145 mmol/L   Potassium 3.9 3.5 - 5.1 mmol/L   Chloride 102 101 - 111  mmol/L   CO2 24 22 - 32 mmol/L   Glucose, Bld 149 (H) 65 - 99 mg/dL   BUN 14 6 - 20 mg/dL   Creatinine, Ser 1.17 0.61 - 1.24 mg/dL   Calcium 9.7 8.9 - 10.3 mg/dL   Total Protein 7.8 6.5 - 8.1 g/dL   Albumin 4.4 3.5 - 5.0 g/dL   AST 37 15 - 41 U/L   ALT 29 17 - 63 U/L   Alkaline Phosphatase 101 38 - 126 U/L   Total Bilirubin 0.4 0.3 - 1.2 mg/dL   GFR calc non Af Amer >60 >60 mL/min   GFR calc Af Amer >60 >60 mL/min    Comment: (NOTE) The eGFR has been calculated using the CKD EPI equation. This calculation has not been validated in all clinical situations. eGFR's persistently <60 mL/min signify possible Chronic Kidney Disease.    Anion gap 15 5 - 15  Ethanol     Status: None   Collection Time: 03/23/15  7:42 AM  Result Value Ref Range   Alcohol, Ethyl (B) <5 <5 mg/dL    Comment:        LOWEST DETECTABLE LIMIT FOR SERUM ALCOHOL IS 5 mg/dL FOR MEDICAL PURPOSES ONLY   Salicylate level     Status: None   Collection Time: 03/23/15  7:42 AM  Result Value Ref Range  Salicylate Lvl <1.9 2.8 - 30.0 mg/dL  CBC with Differential     Status: Abnormal   Collection Time: 03/23/15  7:42 AM  Result Value Ref Range   WBC 15.3 (H) 3.8 - 10.6 K/uL   RBC 4.73 4.40 - 5.90 MIL/uL   Hemoglobin 15.3 13.0 - 18.0 g/dL   HCT 45.3 40.0 - 52.0 %   MCV 95.7 80.0 - 100.0 fL   MCH 32.3 26.0 - 34.0 pg   MCHC 33.8 32.0 - 36.0 g/dL   RDW 14.7 (H) 11.5 - 14.5 %   Platelets 254 150 - 440 K/uL   Neutrophils Relative % 84 %   Neutro Abs 12.8 (H) 1.4 - 6.5 K/uL   Lymphocytes Relative 12 %   Lymphs Abs 1.9 1.0 - 3.6 K/uL   Monocytes Relative 3 %   Monocytes Absolute 0.5 0.2 - 1.0 K/uL   Eosinophils Relative 0 %   Eosinophils Absolute 0.0 0 - 0.7 K/uL   Basophils Relative 1 %   Basophils Absolute 0.1 0 - 0.1 K/uL  Urinalysis complete, with microscopic     Status: Abnormal   Collection Time: 03/23/15  7:42 AM  Result Value Ref Range   Color, Urine YELLOW (A) YELLOW   APPearance CLEAR (A) CLEAR    Glucose, UA NEGATIVE NEGATIVE mg/dL   Bilirubin Urine NEGATIVE NEGATIVE   Ketones, ur NEGATIVE NEGATIVE mg/dL   Specific Gravity, Urine 1.015 1.005 - 1.030   Hgb urine dipstick NEGATIVE NEGATIVE   pH 5.0 5.0 - 8.0   Protein, ur 30 (A) NEGATIVE mg/dL   Nitrite NEGATIVE NEGATIVE   Leukocytes, UA NEGATIVE NEGATIVE   RBC / HPF 0-5 0 - 5 RBC/hpf   WBC, UA 0-5 0 - 5 WBC/hpf   Bacteria, UA RARE (A) NONE SEEN   Squamous Epithelial / LPF 0-5 (A) NONE SEEN   Mucous PRESENT    Hyaline Casts, UA PRESENT   Urine Drug Screen, Qualitative     Status: Abnormal   Collection Time: 03/23/15  7:42 AM  Result Value Ref Range   Tricyclic, Ur Screen NONE DETECTED NONE DETECTED   Amphetamines, Ur Screen NONE DETECTED NONE DETECTED   MDMA (Ecstasy)Ur Screen NONE DETECTED NONE DETECTED   Cocaine Metabolite,Ur [place] NONE DETECTED NONE DETECTED   Opiate, Ur Screen NONE DETECTED NONE DETECTED   Phencyclidine (PCP) Ur S NONE DETECTED NONE DETECTED   Cannabinoid 50 Ng, Ur [place] NONE DETECTED NONE DETECTED   Barbiturates, Ur Screen NONE DETECTED NONE DETECTED   Benzodiazepine, Ur Scrn POSITIVE (A) NONE DETECTED   Methadone Scn, Ur NONE DETECTED NONE DETECTED    Comment: (NOTE) 417  Tricyclics, urine               Cutoff 1000 ng/mL 200  Amphetamines, urine             Cutoff 1000 ng/mL 300  MDMA (Ecstasy), urine           Cutoff 500 ng/mL 400  Cocaine Metabolite, urine       Cutoff 300 ng/mL 500  Opiate, urine                   Cutoff 300 ng/mL 600  Phencyclidine (PCP), urine      Cutoff 25 ng/mL 700  Cannabinoid, urine              Cutoff 50 ng/mL 800  Barbiturates, urine             Cutoff 200  ng/mL 900  Benzodiazepine, urine           Cutoff 200 ng/mL 1000 Methadone, urine                Cutoff 300 ng/mL 1100 1200 The urine drug screen provides only a preliminary, unconfirmed 1300 analytical test result and should not be used for non-medical 1400 purposes. Clinical consideration and professional judgment  should 1500 be applied to any positive drug screen result due to possible 1600 interfering substances. A more specific alternate chemical method 1700 must be used in order to obtain a confirmed analytical result.  1800 Gas chromato graphy / mass spectrometry (GC/MS) is the preferred 1900 confirmatory method.   Blood gas, arterial     Status: Abnormal   Collection Time: 03/23/15  8:20 AM  Result Value Ref Range   FIO2 0.36    Delivery systems NASAL CANNULA    pH, Arterial 7.33 (L) 7.350 - 7.450   pCO2 arterial 49 (H) 32.0 - 48.0 mmHg   pO2, Arterial 98 83.0 - 108.0 mmHg   Bicarbonate 25.8 21.0 - 28.0 mEq/L   Acid-base deficit 0.7 0.0 - 2.0 mmol/L   O2 Saturation 97.1 %   Patient temperature 37.0    Collection site RIGHT RADIAL    Sample type ARTERIAL DRAW    Allens test (pass/fail) POSITIVE (A) PASS    Ct Head Wo Contrast  03/23/2015  CLINICAL DATA:  Patient fell in the back yard 10 hours ago, with subsequent mental status changes and disorientation. Somnolent. EXAM: CT HEAD WITHOUT CONTRAST TECHNIQUE: Contiguous axial images were obtained from the base of the skull through the vertex without intravenous contrast. COMPARISON:  02/19/2011 FINDINGS: The brain has a normal appearance without evidence of malformation, atrophy, old or acute infarction, mass lesion, hemorrhage, hydrocephalus or extra-axial collection. The calvarium is unremarkable. The paranasal sinuses, middle ears and mastoids are clear. IMPRESSION: Normal head CT Electronically Signed   By: Nelson Chimes M.D.   On: 03/23/2015 08:46    Review of Systems  Unable to perform ROS: medical condition   Blood pressure 133/86, pulse 102, temperature 97.1 F (36.2 C), temperature source Axillary, resp. rate 14, weight 165.109 kg (364 lb), SpO2 98 %. Physical Exam  Constitutional: He appears well-developed and well-nourished. No distress.  HENT:  Head: Normocephalic.  Right Ear: External ear normal.  Left Ear: External ear  normal.  Nose: Nose normal.  Mouth/Throat: Oropharynx is clear and moist.  Eyes: Conjunctivae and EOM are normal. Pupils are equal, round, and reactive to light. No scleral icterus.  Neck: Normal range of motion. Neck supple.  Cardiovascular: Regular rhythm, normal heart sounds and intact distal pulses.   No murmur heard. Respiratory: Effort normal and breath sounds normal.  GI: Soft. Bowel sounds are normal.  Musculoskeletal: Normal range of motion. He exhibits tenderness.  Neurological:  Lethargic but oriented x 2 not time, some noted apraxia on R mainly, some aphasia as well PERRLA, EOMI, nl VF, face symmetric, tongue midline Drift in R UE, nl L UE and B LE, nl tone Cant perform FTN on R but normal on L 1+/4 B, mute plantars ?? Neglect of R, nl pain sensation  Skin: Skin is warm. He is not diaphoretic. There is erythema.   CT of head personally reviewed by me and is normal  Assessment/Plan: 1.  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  This could be seizure  or stroke but most likely seizure since pt is unresponsive initially and has elevated WBC. -  MRI of brain w/wo contrast -  EEG -  PRN ativan for seizure activity -  Keep Mg > 2, Ca > 8 and Na > 130 -  Seizure precautions -  Needs CPAP at night for OSA -  Will follow  ,  03/23/2015, 12:53 PM

## 2015-03-23 NOTE — Consult Note (Signed)
PULMONARY / CRITICAL CARE MEDICINE   Name: Ian [name] MRN: [id number] DOB: April 22, 1981    ADMISSION DATE:  03/23/2015 CONSULTATION DATE:  03/23/15  REFERRING MD :  Dr. Amado Coe   CHIEF COMPLAINT:     Altered mental status   HISTORY OF PRESENT ILLNESS - per chart review, patient and Finance     34 yo M who was found laying in the backyard by neighbors, only brought today to the ED by fianc. Patient originally brought to the ED for altered mental status, slurred speech, somnolence, lethargy. He has been seen evaluated by hospitalist and by neurologist, critical care consulted altered mental status. At the time of examination patient is fully awake alert 3, mildly confused about recapping what year it is otherwise acid all other questions appropriately. Patient has a history of marijuana use and alcohol in the past has not had any of those drugs in the past several months. He denies any cocaine, methamphetamine, ecstasy, LSD, PCP. He previously steps Glucophage as ago, denies any illegal drugs use.  Originally, patient came to the ED he was unresponsive, very solid, he was given Narcan with no improvement. Since then he has become more awake and his mentation is improved her fianc. He may have had an accidental falls last night, he is noted to have abrasions on his forehead.     SIGNIFICANT EVENTS    PAST MEDICAL HISTORY    :  Past Medical History  Diagnosis Date  . Hypertension   . Hypercholesteremia   . Anxiety   . Depression    No past surgical history on file. Prior to Admission medications   Medication Sig Start Date End Date Taking? Authorizing Provider  diazepam (VALIUM) 5 MG tablet Take 1 tablet (5 mg total) by mouth every 6 (six) hours as needed for anxiety. 02/09/15  Yes Kerin Salen, MD  traZODone (DESYREL) 100 MG tablet Take 2 tablets (200 mg total) by mouth at bedtime as needed for sleep. 02/09/15  Yes Kerin Salen, MD  citalopram (CELEXA) 40 MG tablet  Take 1 tablet (40 mg total) by mouth daily. Patient not taking: Reported on 03/23/2015 02/09/15   Kerin Salen, MD  QUEtiapine (SEROQUEL) 400 MG tablet Take 1 tablet (400 mg total) by mouth at bedtime. Patient not taking: Reported on 03/23/2015 02/09/15   Kerin Salen, MD  zaleplon (SONATA) 10 MG capsule Take 1 capsule (10 mg total) by mouth at bedtime as needed for sleep. Patient not taking: Reported on 02/09/2015 11/05/14   Kerin Salen, MD   No Known Allergies   FAMILY HISTORY   Family History  Problem Relation Age of Onset  . Heart attack Mother   . Hypertension Mother   . Hypercholesterolemia Mother   . Heart attack Father   . Hypertension Father   . Hypercholesterolemia Father   . Depression Maternal Uncle   . Suicidality Maternal Uncle   . Depression Cousin   . Suicidality Cousin       SOCIAL HISTORY    reports that he has been smoking Cigarettes.  He has a 12 pack-year smoking history. He has never used smokeless tobacco. He reports that he does not drink alcohol or use illicit drugs.  Review of Systems  Constitutional: Negative for fever, chills, weight loss and malaise/fatigue.  HENT: Negative for congestion, hearing loss, nosebleeds and tinnitus.   Eyes: Negative for blurred vision, double vision and photophobia.  Respiratory: Negative for cough, hemoptysis, sputum production, shortness of  breath and stridor.   Cardiovascular: Negative for chest pain, palpitations, orthopnea, claudication and leg swelling.  Gastrointestinal: Negative for heartburn and nausea.  Genitourinary: Negative for dysuria.  Musculoskeletal: Negative for myalgias.  Skin: Negative for rash.  Neurological: Positive for tingling, speech change, loss of consciousness and headaches. Negative for dizziness.  Psychiatric/Behavioral: Positive for memory loss. Negative for hallucinations. The patient is not nervous/anxious and does not have insomnia.       VITAL SIGNS    Temp:  [97.1  F (36.2 C)-98.7 F (37.1 C)] 98.7 F (37.1 C) (10/31 1313) Pulse Rate:  [84-118] 97 (10/31 1500) Resp:  [12-27] 27 (10/31 1500) BP: (130-153)/(61-109) 150/74 mmHg (10/31 1500) SpO2:  [94 %-99 %] 98 % (10/31 1500) Weight:  [359 lb 9.1 oz (163.1 kg)-364 lb (165.109 kg)] 359 lb 9.1 oz (163.1 kg) (10/31 1313) HEMODYNAMICS:   VENTILATOR SETTINGS:   INTAKE / OUTPUT:  Intake/Output Summary (Last 24 hours) at 03/23/15 1528 Last data filed at 03/23/15 1525  Gross per 24 hour  Intake      3 ml  Output    650 ml  Net   -647 ml       PHYSICAL EXAM   Physical Exam  Constitutional: He is oriented to person, place, and time. He appears well-developed. He appears distressed.  HENT:  Head: Normocephalic.  Right Ear: External ear normal.  Left Ear: External ear normal.  Small rounded abrasion noted on right side of forehead with mild swelling Very poor dentition noted  Eyes: Conjunctivae and EOM are normal. Pupils are equal, round, and reactive to light.  Neck: Normal range of motion. Neck supple.  Cardiovascular: Normal rate, normal heart sounds and intact distal pulses.   Pulmonary/Chest: Effort normal and breath sounds normal. No respiratory distress. He has no wheezes.  Abdominal: Bowel sounds are normal.  Genitourinary:  Foley in place  Musculoskeletal: Normal range of motion.  Neurological: He is alert and oriented to person, place, and time. No cranial nerve deficit. Coordination normal.  Appropriate memory recall, slow to recall year but able to recall month and day.  Skin: Skin is warm and dry.  Right forehead abrasion, bilateral knee abrasions  Psychiatric: He has a normal mood and affect.       LABS   LABS:  CBC  Recent Labs Lab 03/23/15 0742  WBC 15.3*  HGB 15.3  HCT 45.3  PLT 254   Coag's No results for input(s): APTT, INR in the last 168 hours. BMET  Recent Labs Lab 03/23/15 0742  NA 141  K 3.9  CL 102  CO2 24  BUN 14  CREATININE 1.17   GLUCOSE 149*   Electrolytes  Recent Labs Lab 03/23/15 0742  CALCIUM 9.7   Sepsis Markers No results for input(s): LATICACIDVEN, PROCALCITON, O2SATVEN in the last 168 hours. ABG  Recent Labs Lab 03/23/15 0820  PHART 7.33*  PCO2ART 49*  PO2ART 98   Liver Enzymes  Recent Labs Lab 03/23/15 0742  AST 37  ALT 29  ALKPHOS 101  BILITOT 0.4  ALBUMIN 4.4   Cardiac Enzymes  Recent Labs Lab 03/23/15 0742  TROPONINI <0.03   Glucose No results for input(s): GLUCAP in the last 168 hours.   Recent Results (from the past 240 hour(s))  MRSA PCR Screening     Status: None   Collection Time: 03/23/15  1:10 PM  Result Value Ref Range Status   MRSA by PCR NEGATIVE NEGATIVE Final    Comment:  The GeneXpert MRSA Assay (FDA approved for NASAL specimens only), is one component of a comprehensive MRSA colonization surveillance program. It is not intended to diagnose MRSA infection nor to guide or monitor treatment for MRSA infections.      Current facility-administered medications:  .  acetaminophen (TYLENOL) tablet 650 mg, 650 mg, Oral, Q6H PRN **OR** acetaminophen (TYLENOL) suppository 650 mg, 650 mg, Rectal, Q6H PRN, Aruna Gouru, MD .  dextrose 5 % and 0.9 % NaCl with KCl 20 mEq/L infusion, , Intravenous, Continuous, Ramonita Lab, MD, Last Rate: 100 mL/hr at 03/23/15 1403 .  enoxaparin (LOVENOX) injection 40 mg, 40 mg, Subcutaneous, Q12H, Aruna Gouru, MD .  metoprolol (LOPRESSOR) injection 5 mg, 5 mg, Intravenous, Q6H PRN, Deanna Artis Gouru, MD .  ondansetron (ZOFRAN) tablet 4 mg, 4 mg, Oral, Q6H PRN **OR** ondansetron (ZOFRAN) injection 4 mg, 4 mg, Intravenous, Q6H PRN, Deanna Artis Gouru, MD .  pantoprazole (PROTONIX) injection 40 mg, 40 mg, Intravenous, Q24H, Aruna Gouru, MD, 40 mg at 03/23/15 1410 .  sodium chloride 0.9 % injection 3 mL, 3 mL, Intravenous, Q12H, Aruna Gouru, MD, 3 mL at 03/23/15 1407  IMAGING    Ct Head Wo Contrast  03/23/2015  CLINICAL DATA:  Patient  fell in the back yard 10 hours ago, with subsequent mental status changes and disorientation. Somnolent. EXAM: CT HEAD WITHOUT CONTRAST TECHNIQUE: Contiguous axial images were obtained from the base of the skull through the vertex without intravenous contrast. COMPARISON:  02/19/2011 FINDINGS: The brain has a normal appearance without evidence of malformation, atrophy, old or acute infarction, mass lesion, hemorrhage, hydrocephalus or extra-axial collection. The calvarium is unremarkable. The paranasal sinuses, middle ears and mastoids are clear. IMPRESSION: Normal head CT Electronically Signed   By: Paulina Fusi M.D.   On: 03/23/2015 08:46      Indwelling Urinary Catheter continued, requirement due to   Reason to continue Indwelling Urinary Catheter for strict Intake/Output monitoring for hemodynamic instability   Central Line continued, requirement due to   Reason to continue [employer] Monitoring of central venous pressure or other hemodynamic parameters   Ventilator continued, requirement due to, resp failure    Ventilator Sedation RASS 0 to -2   Cultures: BCx2  UC  Sputum  Antibiotics:  Lines:   ASSESSMENT/PLAN  34 year old with acute onset of altered mental status leading to somnolence, now with more alertness but still with confusion crit of care consult in for altered mental status.  PULMONARY OSA P:   -CPAP at night -check CXR  CARDIOVASCULAR Hypertension -Continue with home meds   RENAL - Urine tox with no acute abnormalities -Follow BMP/CMP, correct electrolyte abnormalities, check Mg and Phos   GASTROINTESTINAL -PPI  HEMATOLOGIC  leukocytosis  P:  -Reactive leukocytosis, follow CBC in a.m.  INFECTIOUS -No acute indication for antibiotics at this time -Follow urine culture and blood culture given the mental status   NEUROLOGIC A:  ?Seizures, CVA, Encephalopathy (infectious, toxic,metabolic) P:   RASS goal: 0 - Patient now with improving  mentation, able to give almost a full history, fianc at bedside. - Seizures precaution - MRI - Follow up neuro recs - EEG  I have personally obtained a history, examined the patient, evaluated laboratory and imaging results, formulated the assessment and plan and placed orders.  The Patient requires high complexity decision making for assessment and support, frequent evaluation and titration of therapies, application of advanced monitoring technologies and extensive interpretation of multiple databases. Critical Care Time devoted to patient care services described  in this note is 45 minutes.   Overall, patient is critically ill, prognosis is guarded. Patient at high risk for cardiac arrest and death.   Stephanie Acre, MD [facility] Pager [phone] (please enter 7-digits) On Call Pager - [phone] (please enter 7-digits)     03/23/2015, 3:28 PM  Note: This note was prepared with Dragon dictation along with smaller phrase technology. Any transcriptional errors that result from this process are unintentional.  '

## 2015-03-23 NOTE — Progress Notes (Signed)
Pharmacy Note - Enoxaparin  Patient with orders for enoxaparin 1596m SQ Q24H for VTE prophylaxis  Body mass index is 47.45 kg/(m^2). Estimated Creatinine Clearance: 142.4 mL/min (by C-G formula based on Cr of 1.17).  Will adjust to enoxaparin 40mg  SQ Q12H per anticoagulation policy for BMI > 40 with CrCl > 2230ml/min  Garlon HatchetJody Treyvion Durkee, PharmD Clinical Pharmacist  03/23/2015 1:36 PM

## 2015-03-23 NOTE — ED Notes (Signed)
#  7 nasal trumpet placed to left nare by Dr Carollee MassedKaminski.

## 2015-03-23 NOTE — Consult Note (Signed)
  Psychiatry: Consult received. Spoke with hospitalist. Chart reviewed. Patient apparently admitted with altered mental status. Drug screen positive for benzodiazepines. I came to see the patient in the critical care unit and he was off to MRI scan. I will try and check in with him tomorrow morning. No current change to treatment plan.

## 2015-03-23 NOTE — H&P (Signed)
[facility] [place] [facility]   PATIENT NAME: Ian [name]    MR#:  [id number]  DATE OF BIRTH:  07-07-1980  DATE OF ADMISSION:  03/23/2015  PRIMARY CARE PHYSICIAN: No primary care provider on file.   REQUESTING/REFERRING PHYSICIAN: Dr. Carollee Massed  CHIEF COMPLAINT:  Altered mental status  HISTORY OF PRESENT ILLNESS:  Ian [name]  is a 34 y.o. male with a known history of depression, anxiety, hypertension and hyperlipidemia is brought into the emergency department by EMS for altered mental status. Patient's neighbor has witnessed the patient lying in the backyard. They have noticed a contusion on the right forehead but CT head in the ED was normal. According to patient's father patient went to [employer] last night with his friend and was complaining chest pain and tingling in his shoulder to his friend while he was at [employer]. Eventually patient came home but sitting in the backyard and acting weird sense approximately 9:30 PM last night. Patient was not arousable following that as reported by his father. Dad also reported that patient smokes and drinks heavily but as far as he is aware he doesn't take any illicit drugs. He has history of depression and anxiety and sees Dr. Chrissie Noa, psychiatrist. Both parents are sick at this time and will come  to the ED sometime later. During my examination patient is in deep sleep and non-arousable to sternal rub. Hemodynamically he is stable and maintaining pulse ox 96-98% on 2 L of oxygen. Nasal trumpet was placed into his left nare by the ED physician. Patient was given Narcan without any improvement  PAST MEDICAL HISTORY:   Past Medical History  Diagnosis Date  . Hypertension   . Hypercholesteremia   . Anxiety   . Depression     PAST SURGICAL HISTOIRY:  No past surgical history on file.  SOCIAL HISTORY:   As reported by his dad patient smokes one pack a day, drinks alcohol very heavily 2- 3 times in a week but denies  any illicit drugs  FAMILY HISTORY:   Family History  Problem Relation Age of Onset  . Heart attack Mother   . Hypertension Mother   . Hypercholesterolemia Mother   . Heart attack Father   . Hypertension Father   . Hypercholesterolemia Father   . Depression Maternal Uncle   . Suicidality Maternal Uncle   . Depression Cousin   . Suicidality Cousin     DRUG ALLERGIES:  No Known Allergies  REVIEW OF SYSTEMS:  Review of system unobtainable  MEDICATIONS AT HOME:   Prior to Admission medications   Medication Sig Start Date End Date Taking? Authorizing Provider  diazepam (VALIUM) 5 MG tablet Take 1 tablet (5 mg total) by mouth every 6 (six) hours as needed for anxiety. 02/09/15  Yes Kerin Salen, MD  traZODone (DESYREL) 100 MG tablet Take 2 tablets (200 mg total) by mouth at bedtime as needed for sleep. 02/09/15  Yes Kerin Salen, MD  citalopram (CELEXA) 40 MG tablet Take 1 tablet (40 mg total) by mouth daily. Patient not taking: Reported on 03/23/2015 02/09/15   Kerin Salen, MD  QUEtiapine (SEROQUEL) 400 MG tablet Take 1 tablet (400 mg total) by mouth at bedtime. Patient not taking: Reported on 03/23/2015 02/09/15   Kerin Salen, MD  zaleplon (SONATA) 10 MG capsule Take 1 capsule (10 mg total) by mouth at bedtime as needed for sleep. Patient not taking: Reported on 02/09/2015 11/05/14   Kerin Salen, MD  VITAL SIGNS:  Blood pressure 134/94, pulse 84, temperature 97.1 F (36.2 C), temperature source Axillary, resp. rate 14, weight 165.109 kg (364 lb), SpO2 94 %.  PHYSICAL EXAMINATION:  GENERAL:  34 y.o.-year-old patient lying in the bed with no acute distress.  EYES: Pupils small but equal, round, reactive to light and accommodation. No scleral icterus.  HEENT: Head atraumatic, normocephalic. Oropharynx and nasopharynx clear.  NECK:  Supple, no jugular venous distention. No thyroid enlargement, no tenderness.  LUNGS: Normal breath sounds bilaterally, no  wheezing, rales,rhonchi or crepitation. No use of accessory muscles of respiration.  CARDIOVASCULAR: S1, S2 normal. No murmurs, rubs, or gallops.  ABDOMEN: Soft, nontender, nondistended. Bowel sounds present. No organomegaly or mass.  EXTREMITIES: No pedal edema, cyanosis, or clubbing.  NEUROLOGIC: Patient is non-arousable to sternal rub PSYCHIATRIC: The patient is with altered mental status  SKIN: No obvious rash, or ulcer. Right forehead with small contusion LABORATORY PANEL:   CBC  Recent Labs Lab 03/23/15 0742  WBC 15.3*  HGB 15.3  HCT 45.3  PLT 254   ------------------------------------------------------------------------------------------------------------------  Chemistries   Recent Labs Lab 03/23/15 0742  NA 141  K 3.9  CL 102  CO2 24  GLUCOSE 149*  BUN 14  CREATININE 1.17  CALCIUM 9.7  AST 37  ALT 29  ALKPHOS 101  BILITOT 0.4   ------------------------------------------------------------------------------------------------------------------  Cardiac Enzymes No results for input(s): TROPONINI in the last 168 hours. ------------------------------------------------------------------------------------------------------------------  RADIOLOGY:  Ct Head Wo Contrast  03/23/2015  CLINICAL DATA:  Patient fell in the back yard 10 hours ago, with subsequent mental status changes and disorientation. Somnolent. EXAM: CT HEAD WITHOUT CONTRAST TECHNIQUE: Contiguous axial images were obtained from the base of the skull through the vertex without intravenous contrast. COMPARISON:  02/19/2011 FINDINGS: The brain has a normal appearance without evidence of malformation, atrophy, old or acute infarction, mass lesion, hemorrhage, hydrocephalus or extra-axial collection. The calvarium is unremarkable. The paranasal sinuses, middle ears and mastoids are clear. IMPRESSION: Normal head CT Electronically Signed   By: Paulina Fusi M.D.   On: 03/23/2015 08:46    EKG:   Orders placed  or performed during the hospital encounter of 03/23/15  . EKG 12-Lead  . EKG 12-Lead    IMPRESSION AND PLAN:  [name]  is a 34 y.o. male with a known history of depression, anxiety, hypertension and hyperlipidemia is brought into the emergency department by EMS for altered mental status. Patient's neighbor has witnessed the patient lying in the backyard. They have noticed a contusion on the right forehead but CT head in the ED was normal. According to patient's father patient went to [employer] last night with his friend and was complaining chest pain and tingling in his shoulder to his friend while he was at [employer]. Eventually patient came home but sitting in the backyard and acting weird sense approximately 9:30 PM last night. Patient was not arousable following that as reported by his father. Dad also reported that patient smokes and drinks heavily but as far as he is aware he doesn't take any illicit drugs. He has history of depression and anxiety and sees Dr. Chrissie Noa, psychiatrist.  1. AMS  unclear etiology at this time; differential diagnosis  probable drug overdose/ acute MI/sepsis  Admit patient to intensive care unit for close monitoring. Currently patient is hemodynamically stable though he is not arousable. Nasal trumpet is placed to left nare by the ED physician Closely monitor him on telemetry and cycle cardiac biomarkers. Stat troponins are ordered.  Will obtain echocardiogram  initial CT head is negative CT angiogram of the chest is ordered stat- Urine drug screen is positive for benzos but patient is taking benzos at home White count is elevated but the patient is afebrile, will obtain cultures including blood and urine Psychiatric consult and pulmonology consult is placed Patient will be nothing by mouth and provide IV fluids for maintenance   2. HTN - elevated blood pressure Will provide Lopressor IV as needed basis   3. History of obstructive sleep apnea and continues to  smoke Will provide CPAP daily at bedtime  4 history of depression and anxiety Sees Dr. Chrissie NoaWilliam psychiatrist as an outpatient. Will consult psychiatry  5. History of on alcohol abuse Will monitor alcohol levels Implement CIWA protocol  All the records are reviewed and case discussed with ED provider. Management plans discussed with the patient'S family-dad over phone and he is aware of the critical situation of his son and he in agreement with the current plan of care  CODE STATUS: full code/parents are healthcare power of attorney  TOTAL critical care TIME TAKING CARE OF THIS PATIENT: 45 minutes.    Ramonita LabGouru, Britlyn Martine M.D on 03/23/2015 at 10:50 AM  Between 7am to 6pm - Pager - [phone]  After 6pm go to [email or web address] - password [email or web address]  [facility]  Office  [phone]  CC: Primary care physician; No primary care provider on file.

## 2015-03-23 NOTE — ED Provider Notes (Signed)
[facility] Emergency Department Provider Note  ____________________________________________  Time seen: 33  I have reviewed the triage vital signs and the nursing notes.   level V caveat - patient with limited communication, altered mental status. Limited history.   HISTORY  Chief Complaint Altered Mental Status     HPI Ian [name] is a 34 y.o. male  who is brought to the emergency department by EMS after a neighbor called. The neighbor had witnessed the patient fall in the backyard. It is unclear to me if the patient fell and his own backyard or the neighbors. EMS reports history from the family was limited. They report the patient has not been acting right since approximately 9:30 PM last night. They report he has a new friend and they think that there is use of pills involved. EMS reports the patient has not been communicative, he has been somnolent, through their stay. They did give him Narcan without change.    Past Medical History  Diagnosis Date  . Hypertension   . Hypercholesteremia   . Anxiety   . Depression     Patient Active Problem List   Diagnosis Date Noted  . 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 Depression, major, recurrent, in complete remission (HCC) 02/09/2015  . Depression, major, recurrent, moderate (HCC) 02/09/2015    No past surgical history on file.  Current Outpatient Rx  Name  Route  Sig  Dispense  Refill  . diazepam (VALIUM) 5 MG tablet   Oral   Take 1 tablet (5 mg total) by mouth every 6 (six) hours as needed for anxiety.   120 tablet   3   . traZODone (DESYREL) 100 MG tablet   Oral   Take 2 tablets (200 mg total) by mouth at bedtime as needed for sleep.   60 tablet   3   . citalopram (CELEXA) 40 MG tablet   Oral   Take 1 tablet (40 mg total) by mouth daily. Patient not taking: Reported on 03/23/2015   30 tablet   3   . QUEtiapine (SEROQUEL) 400 MG tablet   Oral   Take 1 tablet (400 mg  total) by mouth at bedtime. Patient not taking: Reported on 03/23/2015   30 tablet   3   . zaleplon (SONATA) 10 MG capsule   Oral   Take 1 capsule (10 mg total) by mouth at bedtime as needed for sleep. Patient not taking: Reported on 02/09/2015   30 capsule   1     Allergies Review of patient's allergies indicates no known allergies.  Family History  Problem Relation Age of Onset  . Heart attack Mother   . Hypertension Mother   . Hypercholesterolemia Mother   . Heart attack Father   . Hypertension Father   . Hypercholesterolemia Father   . Depression Maternal Uncle   . Suicidality Maternal Uncle   . Depression Cousin   . Suicidality Cousin     Social History Social History  Substance Use Topics  . Smoking status: Current Every Day Smoker -- 0.75 packs/day for 16 years    Types: Cigarettes  . Smokeless tobacco: Never Used  . Alcohol Use: No    Review of Systems Review of systems not possible due to the level of altered mental status. .  ____________________________________________   PHYSICAL EXAM:  VITAL SIGNS: ED Triage Vitals  Enc Vitals Group     BP 03/23/15 0739 130/109 mmHg     Pulse Rate 03/23/15  0739 93     Resp 03/23/15 0739 12     Temp 03/23/15 0739 97.1 F (36.2 C)     Temp Source 03/23/15 0739 Axillary     SpO2 03/23/15 0739 94 %     Weight 03/23/15 0739 364 lb (165.109 kg)     Height --      Head Cir --      Peak Flow --      Pain Score --      Pain Loc --      Pain Edu? --      Excl. in GC? --     Constitutional:  Somnolent, limited response. Initially nearly no response with a sternal rub. With placement of a nasopharyngeal tube, the patient roused more and was able to answer with single word comments, but these words were not necessarily in context of any questions I provided and did not provide any details. ENT   Head: Normocephalic, but a small contusion with minimal abrasion on the right forehead is noted..  Mouth: No  excessive secretions. Mucous membranes moist..    Cardiovascular: Normal rate, regular rhythm, no murmur noted Respiratory:  Patient is sonorous. Otherwise normal respiratory effort, no tachypnea.    Breath sounds are clear and equal bilaterally.  Gastrointestinal: Soft and nontender. No distention.  Back: No muscle spasm, no tenderness, no CVA tenderness. Musculoskeletal: No deformity noted. Nontender with normal range of motion in all extremities.  No noted edema. Neurologic:  Patient with reduced responsiveness, very somnolent. Arouses with the nasopharyngeal tube placement, otherwise nearly unresponsive. Skin:  Skin is warm, dry. No rash noted. Psychiatric: Unresponsive, except for with placement of nasopharyngeal tube. Answers "yes, yes" when I call his name after the nasopharyngeal tube placement, but does not seem to be able to carry on a conversation or give answers in context of the situation.. ____________________________________________    LABS (pertinent positives/negatives)  Labs Reviewed  ACETAMINOPHEN LEVEL - Abnormal; Notable for the following:    Acetaminophen (Tylenol), Serum <10 (*)    All other components within normal limits  COMPREHENSIVE METABOLIC PANEL - Abnormal; Notable for the following:    Glucose, Bld 149 (*)    All other components within normal limits  CBC WITH DIFFERENTIAL/PLATELET - Abnormal; Notable for the following:    WBC 15.3 (*)    RDW 14.7 (*)    Neutro Abs 12.8 (*)    All other components within normal limits  URINALYSIS COMPLETEWITH MICROSCOPIC ([facility] ONLY) - Abnormal; Notable for the following:    Color, Urine YELLOW (*)    APPearance CLEAR (*)    Protein, ur 30 (*)    Bacteria, UA RARE (*)    Squamous Epithelial / LPF 0-5 (*)    All other components within normal limits  URINE DRUG SCREEN, QUALITATIVE ([facility] ONLY) - Abnormal; Notable for the following:    Benzodiazepine, Ur Scrn POSITIVE (*)    All other components within normal limits   BLOOD GAS, ARTERIAL - Abnormal; Notable for the following:    pH, Arterial 7.33 (*)    pCO2 arterial 49 (*)    Allens test (pass/fail) POSITIVE (*)    All other components within normal limits  ETHANOL  SALICYLATE LEVEL     ____________________________________________   EKG  ED ECG REPORT I, Chrystie Hagwood W, the attending physician, personally viewed and interpreted this ECG.   Date: 03/23/2015  EKG Time: 7:44 AM  Rate: 87  Rhythm: Normal sinus rhythm  Axis: Normal  Intervals: Normal  ST&T Change: None noted   ____________________________________________    RADIOLOGY  CT head  IMPRESSION: Normal head CT  ____________________________________________   PROCEDURES  Placement of nasopharyngeal tube due to low oxygen saturation levels and sonorous breathing. #7 tube to the left nares, with lubricant. Patient rouses with this but no acute distress and no bleeding.  CRITICAL CARE Performed by: Darien RamusKAMINSKI,Maycel Riffe W   Total critical care time: 40 minutes due to the critical nature of this patient's altered mental status, need for airway management with nasopharyngeal tube, discussion with admitting team.  Critical care time was exclusive of separately billable procedures and treating other patients.  Critical care was necessary to treat or prevent imminent or life-threatening deterioration.  Critical care was time spent personally by me on the following activities: development of treatment plan with patient and/or surrogate as well as nursing, discussions with consultants, evaluation of patient's response to treatment, examination of patient, obtaining history from patient or surrogate, ordering and performing treatments and interventions, ordering and review of laboratory studies, ordering and review of radiographic studies, pulse oximetry and re-evaluation of patient's condition.   ____________________________________________   INITIAL IMPRESSION / ASSESSMENT AND  PLAN / ED COURSE  Pertinent labs & imaging results that were available during my care of the patient were reviewed by me and considered in my medical decision making (see chart for details).  Unclear cause of altered mental status. Patient did receive Narcan in the field no response. We will provide another dose here. Labs and screening have been ordered. CT head is ordered as well due to the small contusion noted on the right forehead and the altered mental status. Lower suspicion for intracranial injury. We will obtain a blood gas to get a CO2 level due to possibility of hypercarbia with the patient's sonorous breathing and suspected OSA.  ----------------------------------------- 10:00 AM on 03/23/2015 -----------------------------------------  Patient continues to be somnolent. He is breathing okay with a normal oxygen saturation level with the placement of the nasopharyngeal tube which he is tolerating.  Patient's partial pressure carbon dioxide is slightly elevated but not high enough that I was suspecting its contributing to his altered mental status currently.  Alcohol level is 0.  Urine drug show positive for benzodiazepine.  As the patient appears hemodynamically stable we will not use flumazenil, but rather admit him to the hospital for close monitoring and further care. ____________________________________________   FINAL CLINICAL IMPRESSION(S) / ED DIAGNOSES  Final diagnoses:  Stupor  Overdose, undetermined intent, initial encounter      Darien Ramusavid W Matalie Romberger, MD 03/23/15 1030

## 2015-03-23 NOTE — ED Notes (Signed)
Pt from home via ems- neighbors called 911 due to seeing patient fall in their back yard. Per family pt has been "acting weird" since 9:30pm. Per family pt has been hanging with a new friend and pt brings home frequently a "box of pills". Pt is prescribed valium. When ems arrived pt was alert but disoriented x 4. Pt was given 1mg  Narcan with no change in mental status. Pt arrives to er alert to person, continues to fall asleep during assessment.

## 2015-03-23 NOTE — Progress Notes (Signed)
*  PRELIMINARY RESULTS* Echocardiogram 2D Echocardiogram has been performed.  Ian [name] 03/23/2015, 5:56 PM

## 2015-03-23 NOTE — Progress Notes (Signed)
Pt arrived to ICU accompanied by ER nurse. Pt alert but confused. Very talkative. Unable to provide information or history at this time due to confusion. Pt denies pain. ICU monitors applied to pt. Elink notified. RN will continue to monitor and assess.

## 2015-03-24 ENCOUNTER — Inpatient Hospital Stay: Payer: Self-pay

## 2015-03-24 DIAGNOSIS — F1011 Alcohol abuse, in remission: Secondary | ICD-10-CM

## 2015-03-24 DIAGNOSIS — F1320 Sedative, hypnotic or anxiolytic dependence, uncomplicated: Secondary | ICD-10-CM

## 2015-03-24 DIAGNOSIS — F410 Panic disorder [episodic paroxysmal anxiety] without agoraphobia: Secondary | ICD-10-CM

## 2015-03-24 DIAGNOSIS — F1310 Sedative, hypnotic or anxiolytic abuse, uncomplicated: Secondary | ICD-10-CM

## 2015-03-24 DIAGNOSIS — F411 Generalized anxiety disorder: Secondary | ICD-10-CM

## 2015-03-24 DIAGNOSIS — F13929 Sedative, hypnotic or anxiolytic use, unspecified with intoxication, unspecified: Secondary | ICD-10-CM

## 2015-03-24 HISTORY — DX: Panic disorder (episodic paroxysmal anxiety): F41.0

## 2015-03-24 LAB — TROPONIN I: Troponin I: <0.03 ng/mL (ref <0.031)

## 2015-03-24 LAB — COMPREHENSIVE METABOLIC PANEL
ALBUMIN: 3.8 g/dL (ref 3.5–5.0)
ALT: 21 U/L (ref 17–63)
ANION GAP: 9 (ref 5–15)
AST: 20 U/L (ref 15–41)
Alkaline Phosphatase: 74 U/L (ref 38–126)
BUN: 13 mg/dL (ref 6–20)
CHLORIDE: 103 mmol/L (ref 101–111)
CO2: 26 mmol/L (ref 22–32)
Calcium: 8.5 mg/dL — ABNORMAL LOW (ref 8.9–10.3)
Creatinine, Ser: 1.03 mg/dL (ref 0.61–1.24)
GFR calc Af Amer: >60 mL/min (ref >60)
GFR calc non Af Amer: >60 mL/min (ref >60)
GLUCOSE: 128 mg/dL — AB (ref 65–99)
POTASSIUM: 3.2 mmol/L — AB (ref 3.5–5.1)
SODIUM: 138 mmol/L (ref 135–145)
Total Bilirubin: 0.6 mg/dL (ref 0.3–1.2)
Total Protein: 6.5 g/dL (ref 6.5–8.1)

## 2015-03-24 LAB — CBC
HEMATOCRIT: 38.4 % — AB (ref 40.0–52.0)
HEMOGLOBIN: 12.9 g/dL — AB (ref 13.0–18.0)
MCH: 32.5 pg (ref 26.0–34.0)
MCHC: 33.6 g/dL (ref 32.0–36.0)
MCV: 96.6 fL (ref 80.0–100.0)
Platelets: 194 10*3/uL (ref 150–440)
RBC: 3.98 MIL/uL — ABNORMAL LOW (ref 4.40–5.90)
RDW: 14.7 % — ABNORMAL HIGH (ref 11.5–14.5)
WBC: 10.6 10*3/uL (ref 3.8–10.6)

## 2015-03-24 LAB — PROLACTIN: Prolactin: 8.4 ng/mL (ref 4.0–15.2)

## 2015-03-24 LAB — GLUCOSE, CAPILLARY: Glucose-Capillary: 109 mg/dL — ABNORMAL HIGH (ref 65–99)

## 2015-03-24 LAB — TSH: TSH: 1.902 u[IU]/mL (ref 0.350–4.500)

## 2015-03-24 LAB — PROTIME-INR
INR: 1.03
Prothrombin Time: 13.7 seconds (ref 11.4–15.0)

## 2015-03-24 MED ORDER — POTASSIUM CHLORIDE CRYS ER 20 MEQ PO TBCR
40.0000 meq | EXTENDED_RELEASE_TABLET | Freq: Two times a day (BID) | ORAL | Status: DC
  Administered 2015-03-24: 40 meq via ORAL
  Filled 2015-03-24: qty 2

## 2015-03-24 MED ORDER — CEPHALEXIN 500 MG PO CAPS: 500.0000 mg | ORAL_CAPSULE | Freq: Two times a day (BID) | ORAL | Status: DC

## 2015-03-24 MED ORDER — CEPHALEXIN 500 MG PO CAPS
500.0000 mg | ORAL_CAPSULE | Freq: Two times a day (BID) | ORAL | Status: DC
  Administered 2015-03-24: 500 mg via ORAL
  Filled 2015-03-24 (×2): qty 1

## 2015-03-24 MED ORDER — NICOTINE 21 MG/24HR TD PT24
21.0000 mg | MEDICATED_PATCH | Freq: Every day | TRANSDERMAL | Status: DC
  Administered 2015-03-24: 21 mg via TRANSDERMAL
  Filled 2015-03-24: qty 1

## 2015-03-24 MED ORDER — NICOTINE 21 MG/24HR TD PT24: 21.0000 mg | MEDICATED_PATCH | Freq: Every day | TRANSDERMAL | Status: DC

## 2015-03-24 NOTE — Discharge Summary (Signed)
[facility] [place] at [facility]   PATIENT NAME: Ian [name]    MR#:  [id number]  DATE OF BIRTH:  09/01/1980  SUBJECTIVE:  C/o left leg redness. No fever. Ate ok  REVIEW OF SYSTEMS:   Review of Systems  Constitutional: Negative for fever, chills and weight loss.  HENT: Negative for ear discharge, ear pain and nosebleeds.   Eyes: Negative for blurred vision, pain and discharge.  Respiratory: Negative for sputum production, shortness of breath, wheezing and stridor.   Cardiovascular: Negative for chest pain, palpitations, orthopnea and PND.  Gastrointestinal: Negative for nausea, vomiting, abdominal pain and diarrhea.  Genitourinary: Negative for urgency and frequency.  Musculoskeletal: Negative for back pain and joint pain.  Skin: Positive for rash.  Neurological: Negative for sensory change, speech change, focal weakness and weakness.  Psychiatric/Behavioral: Negative for depression and hallucinations. The patient is nervous/anxious.   All other systems reviewed and are negative.  Tolerating Diet:yes Tolerating PT: pending  DRUG ALLERGIES:  No Known Allergies  VITALS:  Blood pressure 136/74, pulse 109, temperature 98.8 F (37.1 C), temperature source Oral, resp. rate 17, height 6\' 1"  (1.854 m), weight 163.1 kg (359 lb 9.1 oz), SpO2 97 %.  PHYSICAL EXAMINATION:   Physical Exam  GENERAL:  34 y.o.-year-old patient lying in the bed with no acute distress.  EYES: Pupils equal, round, reactive to light and accommodation. No scleral icterus. Extraocular muscles intact.  HEENT: Head atraumatic, normocephalic. Oropharynx and nasopharynx clear.  NECK:  Supple, no jugular venous distention. No thyroid enlargement, no tenderness.  LUNGS: Normal breath sounds bilaterally, no wheezing, rales, rhonchi. No use of accessory muscles of respiration.  CARDIOVASCULAR: S1, S2 normal. No murmurs, rubs, or gallops.  ABDOMEN: Soft, nontender, nondistended. Bowel  sounds present. No organomegaly or mass.  EXTREMITIES: No cyanosis, clubbing or edema b/l.   Left leg distally cellulitis mild NEUROLOGIC: Cranial nerves II through XII are intact. No focal Motor or sensory deficits b/l.   PSYCHIATRIC: The patient is alert and oriented x 3.  SKIN: No obvious rash, lesion, or ulcer.  As abvoe  LABORATORY PANEL:   CBC  Recent Labs Lab 03/24/15 0401  WBC 10.6  HGB 12.9*  HCT 38.4*  PLT 194    Chemistries   Recent Labs Lab 03/23/15 1802 03/24/15 0401  NA  --  138  K  --  3.2*  CL  --  103  CO2  --  26  GLUCOSE  --  128*  BUN  --  13  CREATININE  --  1.03  CALCIUM  --  8.5*  MG 2.1  --   AST  --  20  ALT  --  21  ALKPHOS  --  74  BILITOT  --  0.6    Cardiac Enzymes  Recent Labs Lab 03/24/15 0004  TROPONINI <0.03    RADIOLOGY:  Ct Head Wo Contrast  03/23/2015  CLINICAL DATA:  Patient fell in the back yard 10 hours ago, with subsequent mental status changes and disorientation. Somnolent. EXAM: CT HEAD WITHOUT CONTRAST TECHNIQUE: Contiguous axial images were obtained from the base of the skull through the vertex without intravenous contrast. COMPARISON:  02/19/2011 FINDINGS: The brain has a normal appearance without evidence of malformation, atrophy, old or acute infarction, mass lesion, hemorrhage, hydrocephalus or extra-axial collection. The calvarium is unremarkable. The paranasal sinuses, middle ears and mastoids are clear. IMPRESSION: Normal head CT Electronically Signed   By: Paulina FusiMark  Shogry M.D.   On: 03/23/2015  08:46   Mr Lodema Pilot Contrast  03/23/2015  CLINICAL DATA:  34 year old male with altered mental status and unresponsiveness. Possible fall. Heavy alcohol use. Subsequent encounter. EXAM: MRI HEAD WITHOUT AND WITH CONTRAST TECHNIQUE: Multiplanar, multiecho pulse sequences of the brain and surrounding structures were obtained without and with intravenous contrast. CONTRAST:  20mL MULTIHANCE GADOBENATE DIMEGLUMINE 529 MG/ML  IV SOLN COMPARISON:  03/23/2015 head CT. FINDINGS: Exam is motion degraded. No acute infarct. No intracranial hemorrhage. No hydrocephalus. No MR evidence of Wernicke's encephalopathy. No intracranial mass or abnormal enhancement. Major intracranial vascular structures are patent. Cervical medullary junction, pituitary region, pineal region and orbital structures unremarkable. Minimal paranasal sinus mucosal thickening. IMPRESSION: Exam is motion degraded. No acute infarct. No intracranial hemorrhage. No hydrocephalus. No MR evidence of Wernicke's encephalopathy. No intracranial mass or abnormal enhancement. Electronically Signed   By: Lacy Duverney M.D.   On: 03/23/2015 17:27     ASSESSMENT AND PLAN:   1.AMS suspected due to valium overuse  Mentation back to normal MRI and EEG negative  CT head is negative Urine drug screen is positive for benzos but patient is taking benzos at home Seen by Dr clapacs ok to go home in his meds and f/u Dr Leory Plowman williams  2. Left distal LE mild cellulitis Po keflex PT to ambulate pt. Pt advised to take care of dry skin in the foot at home  3. History of obstructive sleep apnea and continues to smoke Smoking cessation advised about 4 mins spent  4 history of depression and anxiety Sees Dr. Chrissie Noa psychiatrist as an outpatient. As above. Ok to go home with out pt f/u  5. History of on alcohol abuse ETOH <5 No s/o withdrawal  Overall stable D/c foely Ambulate D/c home later today  Case discussed with Care Management/Social Worker. Management plans discussed with the patient and Dr Toni Amend and they are in agreement.  CODE STATUS: full  DVT Prophylaxis: lovenox  TOTAL TIME TAKING CARE OF THIS PATIENT: 40 minutes.  >50% time spent on counselling and coordination of care  POSSIBLE D/C IN 1 DAYS, DEPENDING ON CLINICAL CONDITION.   Ian [name] M.D on 03/24/2015 at 12:21 PM  Between 7am to 6pm - Pager - [phone]  After 6pm go to  [email or web address] - password [email or web address]  [place] [facility]  Office  [phone]  CC: Primary care physician; No primary care provider on file.

## 2015-03-24 NOTE — Care Management Note (Signed)
Case Management Note  Patient Details  Name: Ian [name] MRN: [id number] Date of Birth: 1981/01/30  Subjective/Objective:  RNCM assessment for uninsured patient. Met with patient at bedside. He lives at home with his parents who are elderly and sick. He has no job or income and doesn't drive. His family assists with his transportation. His dad helps him pay for his medications and will be able to help him at discharge. He gets his daily medications from Dr. Jimmye Norman. PT recommending a walker. Will with [facility] to bring patient a free walker through the charity program of [facility]. Spoke with patient' s ICU nurse. He has LLE cellulitis but no open wounds. He ambulated but was sore from the fall and his leg is swollen. Patient sister is a Marine scientist.                   Action/Plan:   Expected Discharge Date:    03/24/2015              Expected Discharge Plan:     In-House Referral:     Discharge planning Services     Post Acute Care Choice:    Choice offered to:     DME Arranged:   [place] DME Agency:   [facility]  HH Arranged:    [place] Agency:     Status of Service:     Medicare Important Message Given:    Date Medicare IM Given:    Medicare IM give by:    Date Additional Medicare IM Given:    Additional Medicare Important Message give by:     If discussed at [place] of Stay Meetings, dates discussed:    Additional Comments:  Jolly Mango, RN 03/24/2015, 2:32 PM

## 2015-03-24 NOTE — Evaluation (Signed)
Physical Therapy Evaluation Patient Details Name: Ian [name] MRN: [id number] DOB: 03/28/1981 Today's Date: 03/24/2015   History of Present Illness  Patient is a 34 y/o male that presents after being found in his backyard on the ground by his neighbors. Patient has a long history of alcohol abuse and apparently overdosed on Xanax leading to this fall. Patient noted to have abrasions on his scalp and L knee. Cellulitis noted in his L anterior tibial and dorsum of foot.   Clinical Impression  Patient initially hesitant with ambulation and maintains knees in extension during first 10-15 feet of ambulation with RW. After this patient begins to flex knees to initiate swing phase of gait with PT cuing for such. Patient displays very limited gait speed with RW and though he does not have any loss of balance in this session presents as a high falls risk. Patient does not demonstrate gait mechanics currently that would allow him to ambulate without RW, and he was noted to make significant use of UEs during ambulation with RW. Patient does improve gait speed and mechanics mildly throughout session as the stiffness in his knees he initially experienced seemed to resolve with increased mobility. Strength appeared symmetrical and well above normal. No attention deficits noted, and appropriate answers and demeanor noted throughout evaluation. Given his heavy use of AD in this session it is highly recommended he use one for home mobility and HHPT consult indicated for mobility deficits. Skilled PT services are indicated for his decline in ambulatory balance.     Follow Up Recommendations Home health PT    Equipment Recommendations  Rolling walker with 5" wheels (Bariatric RW preferred.)    Recommendations for Other Services       Precautions / Restrictions Precautions Precautions: Fall Restrictions Weight Bearing Restrictions: No      Mobility  Bed Mobility Overal bed mobility: Modified Independent              General bed mobility comments: Uses bed rails but no assistance otherwise.   Transfers Overall transfer level: Needs assistance Equipment used: Rolling walker (2 wheeled) Transfers: Sit to/from Stand Sit to Stand: +2 safety/equipment;Supervision         General transfer comment: Patient transfers sit to stand quickly and with no additional support, no loss of balance noted.   Ambulation/Gait Ambulation/Gait assistance: Min guard;+2 safety/equipment Ambulation Distance (Feet): 50 Feet Assistive device: Rolling walker (2 wheeled) Gait Pattern/deviations: Step-through pattern;Decreased step length - right;Decreased step length - left;Antalgic;Wide base of support   Gait velocity interpretation: <1.8 ft/sec, indicative of risk for recurrent falls General Gait Details: Patient ambulates with minimal knee flexion bilaterally initially secondary to "stiffness". With cuing for increased stride length and knee flexion to initiate swing phase patient demonstrates increased stride length and improved knee flexion. States he feels better as gait progresses, significantly decreased gait speed noted.  Stairs            Wheelchair Mobility    Modified Rankin (Stroke Patients Only)       Balance Overall balance assessment: Needs assistance Sitting-balance support: No upper extremity supported Sitting balance-Leahy Scale: Good Sitting balance - Comments: No deficits noted at EOB   Standing balance support: Bilateral upper extremity supported Standing balance-Leahy Scale: Fair Standing balance comment: Patient is able to complete turns with no loss of balance. Decreased gait speed, wide base of support, minimal step length, altered gait mechanics all indicate high falls risk.  Pertinent Vitals/Pain Pain Assessment:  (Reports pain in his shin/foot and knees as stiff initially with ambulation, then states this feels improved with  increased ambulation.)    Home Living Family/patient expects to be discharged to:: Private residence Living Arrangements: Parent Available Help at Discharge: Family (Though it sounds like parents have been ill) Type of Home: House Home Access: Stairs to enter Entrance Stairs-Rails:  (Can reach one, did not specify. ) Entrance Stairs-Number of Steps: 3          Prior Function Level of Independence: Independent         Comments: Patient experienced a fall as a result of overdose, no falls described by patient.      Hand Dominance        Extremity/Trunk Assessment   Upper Extremity Assessment: Overall WFL for tasks assessed (equivalent strength bilaterally with push/pull, no deficits noted.)           Lower Extremity Assessment: Overall WFL for tasks assessed         Communication   Communication: No difficulties  Cognition Arousal/Alertness: Awake/alert Behavior During Therapy: WFL for tasks assessed/performed Overall Cognitive Status: Within Functional Limits for tasks assessed (There may be a baseline of cognitive deficits, WFL otherwise. )                      General Comments General comments (skin integrity, edema, etc.): Abrasions noted on L knee and forehad. Cellulitis noted on anterior shin and dorsum of L foot. Bruising noted on medial aspect of L foot.     Exercises        Assessment/Plan    PT Assessment Patient needs continued PT services  PT Diagnosis Difficulty walking;Abnormality of gait   PT Problem List Decreased strength;Decreased mobility;Decreased activity tolerance;Decreased balance;Decreased knowledge of use of DME;Pain  PT Treatment Interventions DME instruction;Gait training;Therapeutic exercise;Therapeutic activities;Functional mobility training;Balance training;Stair training   PT Goals (Current goals can be found in the Care Plan section) Acute Rehab PT Goals Patient Stated Goal: To return home PT Goal Formulation: With  patient Time For Goal Achievement: 04/07/15 Potential to Achieve Goals: Good    Frequency Min 2X/week   Barriers to discharge Inaccessible home environment Patient will likely need assistance with ascending 3 steps.     Co-evaluation               End of Session Equipment Utilized During Treatment: Gait belt Activity Tolerance: Patient tolerated treatment well Patient left: in bed;with call bell/phone within reach           Time: 1340-1406 PT Time Calculation (min) (ACUTE ONLY): 26 min   Charges:   PT Evaluation $Initial PT Evaluation Tier I: 1 Procedure      PT G Codes:       Kerin Ransom, PT, DPT    03/24/2015, 2:47 PM

## 2015-03-24 NOTE — Progress Notes (Signed)
Neurology Note  S: Feels good but does not remember yesterday. Says that he has had memory issues for some time  ROS neg x 8 systems except per HPI  O: 98.8    114/76    89   14 NAD, morbidly obese Normocephalic, oropharynx clear Supple, no JVD CTA B, no wheezing RRR, no murmur No C/C/E  Alert and oriented x 2 not time, nl language and speech today PERRLA, EOMI, face symmetric 5/5 B, nl tone  MRI personally reviewed by me and normal  EEG normal  A/P: 1. Encephalopathy- improved, no clear etiology except for benzo overdose vs. Seizure induced by benzos 2.  Anxiety-  Moderate and cause of problems -  Pt advised to avoid large doses of benzos -  Agree with psychiatry consult -  Will sign off, please call with questions -

## 2015-03-24 NOTE — Consult Note (Signed)
PULMONARY / CRITICAL CARE MEDICINE   Name: Ian [name] MRN: [id number] DOB: 07-Nov-1980    ADMISSION DATE:  03/23/2015 CONSULTATION DATE:  03/23/15   ASSESSMENT/PLAN  34 year old with acute onset of altered mental status, patient admits to taking a Valium yesterday due to a panic attack. He is denying any other illicit medications, he is doing much better. He is awake, alert, and it would be. Stable for transfer out of intensive care unit today.  PULMONARY OSA P:   -CPAP at night   CARDIOVASCULAR Hypertension -Continue with home meds   RENAL - Urine tox with no acute abnormalities -Follow BMP/CMP, correct electrolyte abnormalities, check Mg and Phos   GASTROINTESTINAL Patient now on a diet, can discontinue GI prophylaxis.  HEMATOLOGIC  leukocytosis  P:  -Reactive leukocytosis, which is now resolved.  INFECTIOUS -Appears to have some left lower extremity cellulitis, we'll start cefazolin.   NEUROLOGIC A:  ?Seizures, CVA, Encephalopathy (infectious, toxic,metabolic) P:   RASS goal: 0 - Patient now with improving mentation, able to give almost a full history, fianc at bedside. - Seizures precaution - MRI - Follow up neuro recs - EEG -Psych following.   CHIEF COMPLAINT:     Altered mental status, doing much better this am. Is awake and alert, oriented times 3.   Admits to taking 8 valium yesterday due to anxiety about the [employer] losing the world series.     SIGNIFICANT EVENTS    PAST MEDICAL HISTORY    :  Past Medical History  Diagnosis Date  . Hypertension   . Hypercholesteremia   . Anxiety   . Depression    No past surgical history on file. Prior to Admission medications   Medication Sig Start Date End Date Taking? Authorizing Provider  diazepam (VALIUM) 5 MG tablet Take 1 tablet (5 mg total) by mouth every 6 (six) hours as needed for anxiety. 02/09/15  Yes Kerin Salen, MD  traZODone (DESYREL) 100 MG tablet Take 2 tablets (200 mg total) by  mouth at bedtime as needed for sleep. 02/09/15  Yes Kerin Salen, MD  citalopram (CELEXA) 40 MG tablet Take 1 tablet (40 mg total) by mouth daily. Patient not taking: Reported on 03/23/2015 02/09/15   Kerin Salen, MD  QUEtiapine (SEROQUEL) 400 MG tablet Take 1 tablet (400 mg total) by mouth at bedtime. Patient not taking: Reported on 03/23/2015 02/09/15   Kerin Salen, MD  zaleplon (SONATA) 10 MG capsule Take 1 capsule (10 mg total) by mouth at bedtime as needed for sleep. Patient not taking: Reported on 02/09/2015 11/05/14   Kerin Salen, MD   No Known Allergies   SOCIAL HISTORY    reports that he has been smoking Cigarettes.  He has a 12 pack-year smoking history. He has never used smokeless tobacco. He reports that he does not drink alcohol or use illicit drugs.  Review of Systems  Constitutional: Negative for fever, chills, weight loss and malaise/fatigue.  HENT: Negative for congestion, hearing loss, nosebleeds and tinnitus.   Eyes: Negative for blurred vision, double vision and photophobia.  Respiratory: Negative for cough, hemoptysis, sputum production, shortness of breath and stridor.   Cardiovascular: Negative for chest pain, palpitations, orthopnea, claudication and leg swelling.  Gastrointestinal: Negative for heartburn and nausea.  Genitourinary: Negative for dysuria.  Musculoskeletal: Negative for myalgias.  Skin: Negative for rash.  Neurological: Positive for tingling, speech change, loss of consciousness and headaches. Negative for dizziness.  Psychiatric/Behavioral: Positive for memory loss. Negative for  hallucinations. The patient is not nervous/anxious and does not have insomnia.       VITAL SIGNS    Temp:  [98.3 F (36.8 C)-98.8 F (37.1 C)] 98.8 F (37.1 C) (11/01 0730) Pulse Rate:  [91-118] 97 (11/01 0700) Resp:  [12-31] 22 (11/01 0700) BP: (63-153)/(47-99) 129/87 mmHg (11/01 0730) SpO2:  [95 %-100 %] 98 % (11/01 0700) Weight:  [163.1 kg (359  lb 9.1 oz)] 163.1 kg (359 lb 9.1 oz) (10/31 1313) HEMODYNAMICS:   VENTILATOR SETTINGS:   INTAKE / OUTPUT:  Intake/Output Summary (Last 24 hours) at 03/24/15 0829 Last data filed at 03/24/15 0744  Gross per 24 hour  Intake   5757 ml  Output   4575 ml  Net   1182 ml       PHYSICAL EXAM   Physical Exam  Constitutional: He is oriented to person, place, and time. He appears well-developed. He appears distressed.  HENT:  Head: Normocephalic.  Right Ear: External ear normal.  Left Ear: External ear normal.  Small rounded abrasion noted on right side of forehead with mild swelling Very poor dentition noted  Eyes: Conjunctivae and EOM are normal. Pupils are equal, round, and reactive to light.  Neck: Normal range of motion. Neck supple.  Cardiovascular: Normal rate, normal heart sounds and intact distal pulses.   Pulmonary/Chest: Effort normal and breath sounds normal. No respiratory distress. He has no wheezes.  Abdominal: Bowel sounds are normal.  Genitourinary:  Foley in place  Musculoskeletal: Normal range of motion.  Neurological: He is alert and oriented to person, place, and time. No cranial nerve deficit. Coordination normal.  Appropriate memory recall, slow to recall year but able to recall month and day.  Skin: Skin is warm and dry.  Right forehead abrasion, bilateral knee abrasions  Psychiatric: He has a normal mood and affect.  . There is tenderness on the dorsum of the left foot extending to the ankle, this is tender to the touch and warm.     LABS   LABS:  CBC  Recent Labs Lab 03/23/15 0742 03/24/15 0401  WBC 15.3* 10.6  HGB 15.3 12.9*  HCT 45.3 38.4*  PLT 254 194   Coag's  Recent Labs Lab 03/24/15 0401  INR 1.03   BMET  Recent Labs Lab 03/23/15 0742 03/24/15 0401  NA 141 138  K 3.9 3.2*  CL 102 103  CO2 24 26  BUN 14 13  CREATININE 1.17 1.03  GLUCOSE 149* 128*   Electrolytes  Recent Labs Lab 03/23/15 0742 03/23/15 1802  03/24/15 0401  CALCIUM 9.7  --  8.5*  MG  --  2.1  --   PHOS  --  3.6  --    Sepsis Markers No results for input(s): LATICACIDVEN, PROCALCITON, O2SATVEN in the last 168 hours. ABG  Recent Labs Lab 03/23/15 0820  PHART 7.33*  PCO2ART 49*  PO2ART 98   Liver Enzymes  Recent Labs Lab 03/23/15 0742 03/24/15 0401  AST 37 20  ALT 29 21  ALKPHOS 101 74  BILITOT 0.4 0.6  ALBUMIN 4.4 3.8   Cardiac Enzymes  Recent Labs Lab 03/23/15 0742 03/23/15 1802 03/24/15 0004  TROPONINI <0.03 <0.03 <0.03   Glucose No results for input(s): GLUCAP in the last 168 hours.   Recent Results (from the past 240 hour(s))  MRSA PCR Screening     Status: None   Collection Time: 03/23/15  1:10 PM  Result Value Ref Range Status   MRSA by PCR NEGATIVE NEGATIVE  Final    Comment:        The GeneXpert MRSA Assay (FDA approved for NASAL specimens only), is one component of a comprehensive MRSA colonization surveillance program. It is not intended to diagnose MRSA infection nor to guide or monitor treatment for MRSA infections.      Current facility-administered medications:  .  acetaminophen (TYLENOL) tablet 650 mg, 650 mg, Oral, Q6H PRN, 650 mg at 03/24/15 0756 **OR** acetaminophen (TYLENOL) suppository 650 mg, 650 mg, Rectal, Q6H PRN, Aruna Gouru, MD .  dextrose 5 % and 0.9 % NaCl with KCl 20 mEq/L infusion, , Intravenous, Continuous, Aruna Gouru, MD, Last Rate: 100 mL/hr at 03/24/15 0700 .  enoxaparin (LOVENOX) injection 40 mg, 40 mg, Subcutaneous, Q12H, Ramonita Lab, MD, 40 mg at 03/23/15 2155 .  Influenza vac split quadrivalent PF (FLUARIX) injection 0.5 mL, 0.5 mL, Intramuscular, Tomorrow-1000, Aruna Gouru, MD .  metoprolol (LOPRESSOR) injection 5 mg, 5 mg, Intravenous, Q6H PRN, Ramonita Lab, MD .  nicotine (NICODERM CQ - dosed in mg/24 hours) patch 21 mg, 21 mg, Transdermal, Daily, Oralia Manis, MD, 21 mg at 03/24/15 0452 .  ondansetron (ZOFRAN) tablet 4 mg, 4 mg, Oral, Q6H PRN **OR**  ondansetron (ZOFRAN) injection 4 mg, 4 mg, Intravenous, Q6H PRN, Deanna Artis Gouru, MD .  pantoprazole (PROTONIX) injection 40 mg, 40 mg, Intravenous, Q24H, Aruna Gouru, MD, 40 mg at 03/23/15 1410 .  sodium chloride 0.9 % injection 3 mL, 3 mL, Intravenous, Q12H, Aruna Gouru, MD, 3 mL at 03/23/15 1407 .  traZODone (DESYREL) tablet 50 mg, 50 mg, Oral, QHS PRN, Oralia Manis, MD, 50 mg at 03/23/15 2149  IMAGING    Ct Head Wo Contrast  03/23/2015  CLINICAL DATA:  Patient fell in the back yard 10 hours ago, with subsequent mental status changes and disorientation. Somnolent. EXAM: CT HEAD WITHOUT CONTRAST TECHNIQUE: Contiguous axial images were obtained from the base of the skull through the vertex without intravenous contrast. COMPARISON:  02/19/2011 FINDINGS: The brain has a normal appearance without evidence of malformation, atrophy, old or acute infarction, mass lesion, hemorrhage, hydrocephalus or extra-axial collection. The calvarium is unremarkable. The paranasal sinuses, middle ears and mastoids are clear. IMPRESSION: Normal head CT Electronically Signed   By: Paulina Fusi M.D.   On: 03/23/2015 08:46   Mr Laqueta Jean UX Contrast  03/23/2015  CLINICAL DATA:  34 year old male with altered mental status and unresponsiveness. Possible fall. Heavy alcohol use. Subsequent encounter. EXAM: MRI HEAD WITHOUT AND WITH CONTRAST TECHNIQUE: Multiplanar, multiecho pulse sequences of the brain and surrounding structures were obtained without and with intravenous contrast. CONTRAST:  20mL MULTIHANCE GADOBENATE DIMEGLUMINE 529 MG/ML IV SOLN COMPARISON:  03/23/2015 head CT. FINDINGS: Exam is motion degraded. No acute infarct. No intracranial hemorrhage. No hydrocephalus. No MR evidence of Wernicke's encephalopathy. No intracranial mass or abnormal enhancement. Major intracranial vascular structures are patent. Cervical medullary junction, pituitary region, pineal region and orbital structures unremarkable. Minimal paranasal  sinus mucosal thickening. IMPRESSION: Exam is motion degraded. No acute infarct. No intracranial hemorrhage. No hydrocephalus. No MR evidence of Wernicke's encephalopathy. No intracranial mass or abnormal enhancement. Electronically Signed   By: Lacy Duverney M.D.   On: 03/23/2015 17:27      Indwelling Urinary Catheter continued, requirement due to   Reason to continue Indwelling Urinary Catheter for strict Intake/Output monitoring for hemodynamic instability   Central Line continued, requirement due to   Reason to continue [employer] Monitoring of central venous pressure or other hemodynamic parameters  Ventilator continued, requirement due to, resp failure    Ventilator Sedation RASS 0 to -2   Cultures: BCx2  UC  Sputum  Antibiotics:  Lines:   Wells Guiles, M.D.    03/24/2015, 8:29 AM  Note: This note was prepared with Dragon dictation along with smaller phrase technology. Any transcriptional errors that result from this process are unintentional.  '

## 2015-03-24 NOTE — Consult Note (Signed)
[facility] Face-to-Face Psychiatry Consult   Reason for Consult:  Consult for this 34 year old man who was admitted to the hospital with altered mental status. Concern about benzodiazepine use reason for altered mental status possible suicidality. Referring Physician:  Posey Pronto Patient Identification: Ian [name] MRN:  [id number] Principal Diagnosis: Benzodiazepine abuse, episodic Diagnosis:   Patient Active Problem List   Diagnosis Date Noted  . 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 Sedative intoxication ([place]) [F64.332] 03/24/2015  . Generalized anxiety disorder [F41.1] 03/24/2015  . Panic disorder [F41.0] 03/24/2015  . 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 Altered mental status [R41.82] 03/23/2015  . 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 Depression, major, recurrent, in complete remission ([place]) [F33.42] 02/09/2015  . Depression, major, recurrent, moderate ([place]) [F33.1] 02/09/2015    Total Time spent with patient: 1 hour  Subjective:   Ian [name] is a 34 y.o. male patient admitted with "I just have really bad anxiety".  HPI:  Information from the patient and the chart. Chart reviewed. I also went back and reviewed notes and admission information from our previous medical record system. Labs done on this admission all reviewed. Patient interviewed. Case discussed with hospitalist. This 34 year old man was brought into the hospital after a neighbor found him passed out in his backyard. Patient has multiple scrapes on him and looks like he probably had a fall. Patient says that he remembers taking an excessive amount of his Valium. He was a little unclear about it but eventually settled on telling me he thinks he took 30 mg at once of Valium compared to his currently prescribed dose of 5 mg 3 times a day. He says he did this because he was having a panic attack. His panic attacks vary from time to time and he says this was a particularly bad one. He  remembers that he was planning to go to bed afterwards but doesn't remember what happened. Patient states very firmly that he had not been drinking alcohol. States that he had not taken any other pills. Absolutely denies any suicidal intent or plan. He does admit that his mood is been more down and depressed recently and that he has his chronic level of anxiety. Sleep is okay with his current medicine. Appetite normal. He is clear multiple times that he was having no suicidal thoughts. He does feel frustrated that his current medicine regimen is not controlling his panic attacks well enough by his assessment.  Social history: Patient lives with his parents. He has not worked since the spring time. He is hoping to apply for disability. He is emotionally attached to a young child who is the daughter of his girlfriend but he does not live with them currently. He says that he likes staying with his parents because they help to prevent him from relapsing into alcohol use.  Medical history: Overweight. Dyslipidemia. Possible high blood pressure he is not certain about that. Multiple contusions from his recent fall.  Substance abuse history: Patient has a history of alcohol abuse and as recently as 2012 when he was admitted here he had been drinking heavily. The admission note says that his father had reported that he still drinks. The patient swore to me that he has not been drinking alcohol since 2010 although based on our records clearly it was sometime later than that. In any case he swears that he doesn't drink or abuse any other drugs now. He says that he does not normally abuses benzodiazepines. He  does have a past history of other substance abuse including huffing.  Current medication: Psychiatrically he states he takes citalopram 40 mg per day, Seroquel 400 mg at night, trazodone 200 mg at night and Valium 5 mg 3 times a day.  Past Psychiatric History: Patient has a past history of chronic depression and  anxiety as well as substance abuse problems. Last hospitalization was here in 2012. He has had other hospitalizations prior to that as an adolescent. History of alcohol abuse as well as depression and anxiety. Not clear that he is made any's serious suicide attempts. He says that he hasn't. Currently sees Dr. Jimmye Norman but is not seeing a therapist.  Risk to Self: Is patient at risk for suicide?: No Risk to Others:   Prior Inpatient Therapy:   Prior Outpatient Therapy:    Past Medical History:  Past Medical History  Diagnosis Date  . Hypertension   . Hypercholesteremia   . Anxiety   . Depression    No past surgical history on file. Family History:  Family History  Problem Relation Age of Onset  . Heart attack Mother   . Hypertension Mother   . Hypercholesterolemia Mother   . Heart attack Father   . Hypertension Father   . Hypercholesterolemia Father   . Depression Maternal Uncle   . Suicidality Maternal Uncle   . Depression Cousin   . Suicidality Cousin    Family Psychiatric  History: Patient states he had a cousin and an uncle who both committed suicide. Says he has no history of substance abuse in any family members Social History:  History  Alcohol Use No     History  Drug Use No    Social History   Social History  . Marital Status: Single    Spouse Name: N/A  . Number of Children: N/A  . Years of Education: N/A   Social History Main Topics  . Smoking status: Current Every Day Smoker -- 0.75 packs/day for 16 years    Types: Cigarettes  . Smokeless tobacco: Never Used  . Alcohol Use: No  . Drug Use: No  . Sexual Activity: No   Other Topics Concern  . Not on file   Social History Narrative   Additional Social History:                          Allergies:  No Known Allergies  Labs:  Results for orders placed or performed during the hospital encounter of 03/23/15 (from the past 48 hour(s))  Acetaminophen level     Status: Abnormal   Collection  Time: 03/23/15  7:42 AM  Result Value Ref Range   Acetaminophen (Tylenol), Serum <10 (L) 10 - 30 ug/mL    Comment:        THERAPEUTIC CONCENTRATIONS VARY SIGNIFICANTLY. A RANGE OF 10-30 ug/mL MAY BE AN EFFECTIVE CONCENTRATION FOR MANY PATIENTS. HOWEVER, SOME ARE BEST TREATED AT CONCENTRATIONS OUTSIDE THIS RANGE. ACETAMINOPHEN CONCENTRATIONS >150 ug/mL AT 4 HOURS AFTER INGESTION AND >50 ug/mL AT 12 HOURS AFTER INGESTION ARE OFTEN ASSOCIATED WITH TOXIC REACTIONS.   Comprehensive metabolic panel     Status: Abnormal   Collection Time: 03/23/15  7:42 AM  Result Value Ref Range   Sodium 141 135 - 145 mmol/L   Potassium 3.9 3.5 - 5.1 mmol/L   Chloride 102 101 - 111 mmol/L   CO2 24 22 - 32 mmol/L   Glucose, Bld 149 (H) 65 - 99 mg/dL  BUN 14 6 - 20 mg/dL   Creatinine, Ser 1.17 0.61 - 1.24 mg/dL   Calcium 9.7 8.9 - 10.3 mg/dL   Total Protein 7.8 6.5 - 8.1 g/dL   Albumin 4.4 3.5 - 5.0 g/dL   AST 37 15 - 41 U/L   ALT 29 17 - 63 U/L   Alkaline Phosphatase 101 38 - 126 U/L   Total Bilirubin 0.4 0.3 - 1.2 mg/dL   GFR calc non Af Amer >60 >60 mL/min   GFR calc Af Amer >60 >60 mL/min    Comment: (NOTE) The eGFR has been calculated using the CKD EPI equation. This calculation has not been validated in all clinical situations. eGFR's persistently <60 mL/min signify possible Chronic Kidney Disease.    Anion gap 15 5 - 15  Ethanol     Status: None   Collection Time: 03/23/15  7:42 AM  Result Value Ref Range   Alcohol, Ethyl (B) <5 <5 mg/dL    Comment:        LOWEST DETECTABLE LIMIT FOR SERUM ALCOHOL IS 5 mg/dL FOR MEDICAL PURPOSES ONLY   Salicylate level     Status: None   Collection Time: 03/23/15  7:42 AM  Result Value Ref Range   Salicylate Lvl <4.9 2.8 - 30.0 mg/dL  CBC with Differential     Status: Abnormal   Collection Time: 03/23/15  7:42 AM  Result Value Ref Range   WBC 15.3 (H) 3.8 - 10.6 K/uL   RBC 4.73 4.40 - 5.90 MIL/uL   Hemoglobin 15.3 13.0 - 18.0 g/dL   HCT  45.3 40.0 - 52.0 %   MCV 95.7 80.0 - 100.0 fL   MCH 32.3 26.0 - 34.0 pg   MCHC 33.8 32.0 - 36.0 g/dL   RDW 14.7 (H) 11.5 - 14.5 %   Platelets 254 150 - 440 K/uL   Neutrophils Relative % 84 %   Neutro Abs 12.8 (H) 1.4 - 6.5 K/uL   Lymphocytes Relative 12 %   Lymphs Abs 1.9 1.0 - 3.6 K/uL   Monocytes Relative 3 %   Monocytes Absolute 0.5 0.2 - 1.0 K/uL   Eosinophils Relative 0 %   Eosinophils Absolute 0.0 0 - 0.7 K/uL   Basophils Relative 1 %   Basophils Absolute 0.1 0 - 0.1 K/uL  Urinalysis complete, with microscopic     Status: Abnormal   Collection Time: 03/23/15  7:42 AM  Result Value Ref Range   Color, Urine YELLOW (A) YELLOW   APPearance CLEAR (A) CLEAR   Glucose, UA NEGATIVE NEGATIVE mg/dL   Bilirubin Urine NEGATIVE NEGATIVE   Ketones, ur NEGATIVE NEGATIVE mg/dL   Specific Gravity, Urine 1.015 1.005 - 1.030   Hgb urine dipstick NEGATIVE NEGATIVE   pH 5.0 5.0 - 8.0   Protein, ur 30 (A) NEGATIVE mg/dL   Nitrite NEGATIVE NEGATIVE   Leukocytes, UA NEGATIVE NEGATIVE   RBC / HPF 0-5 0 - 5 RBC/hpf   WBC, UA 0-5 0 - 5 WBC/hpf   Bacteria, UA RARE (A) NONE SEEN   Squamous Epithelial / LPF 0-5 (A) NONE SEEN   Mucous PRESENT    Hyaline Casts, UA PRESENT   Urine Drug Screen, Qualitative     Status: Abnormal   Collection Time: 03/23/15  7:42 AM  Result Value Ref Range   Tricyclic, Ur Screen NONE DETECTED NONE DETECTED   Amphetamines, Ur Screen NONE DETECTED NONE DETECTED   MDMA (Ecstasy)Ur Screen NONE DETECTED NONE DETECTED   Cocaine Metabolite,Ur [place]  NONE DETECTED NONE DETECTED   Opiate, Ur Screen NONE DETECTED NONE DETECTED   Phencyclidine (PCP) Ur S NONE DETECTED NONE DETECTED   Cannabinoid 50 Ng, Ur [place] NONE DETECTED NONE DETECTED   Barbiturates, Ur Screen NONE DETECTED NONE DETECTED   Benzodiazepine, Ur Scrn POSITIVE (A) NONE DETECTED   Methadone Scn, Ur NONE DETECTED NONE DETECTED    Comment: (NOTE) 854  Tricyclics, urine               Cutoff 1000 ng/mL 200   Amphetamines, urine             Cutoff 1000 ng/mL 300  MDMA (Ecstasy), urine           Cutoff 500 ng/mL 400  Cocaine Metabolite, urine       Cutoff 300 ng/mL 500  Opiate, urine                   Cutoff 300 ng/mL 600  Phencyclidine (PCP), urine      Cutoff 25 ng/mL 700  Cannabinoid, urine              Cutoff 50 ng/mL 800  Barbiturates, urine             Cutoff 200 ng/mL 900  Benzodiazepine, urine           Cutoff 200 ng/mL 1000 Methadone, urine                Cutoff 300 ng/mL 1100 1200 The urine drug screen provides only a preliminary, unconfirmed 1300 analytical test result and should not be used for non-medical 1400 purposes. Clinical consideration and professional judgment should 1500 be applied to any positive drug screen result due to possible 1600 interfering substances. A more specific alternate chemical method 1700 must be used in order to obtain a confirmed analytical result.  1800 Gas chromato graphy / mass spectrometry (GC/MS) is the preferred 1900 confirmatory method.   Troponin I     Status: None   Collection Time: 03/23/15  7:42 AM  Result Value Ref Range   Troponin I <0.03 <0.031 ng/mL    Comment:        NO INDICATION OF MYOCARDIAL INJURY.   Blood gas, arterial     Status: Abnormal   Collection Time: 03/23/15  8:20 AM  Result Value Ref Range   FIO2 0.36    Delivery systems NASAL CANNULA    pH, Arterial 7.33 (L) 7.350 - 7.450   pCO2 arterial 49 (H) 32.0 - 48.0 mmHg   pO2, Arterial 98 83.0 - 108.0 mmHg   Bicarbonate 25.8 21.0 - 28.0 mEq/L   Acid-base deficit 0.7 0.0 - 2.0 mmol/L   O2 Saturation 97.1 %   Patient temperature 37.0    Collection site RIGHT RADIAL    Sample type ARTERIAL DRAW    Allens test (pass/fail) POSITIVE (A) PASS  MRSA PCR Screening     Status: None   Collection Time: 03/23/15  1:10 PM  Result Value Ref Range   MRSA by PCR NEGATIVE NEGATIVE    Comment:        The GeneXpert MRSA Assay (FDA approved for NASAL specimens only), is one  component of a comprehensive MRSA colonization surveillance program. It is not intended to diagnose MRSA infection nor to guide or monitor treatment for MRSA infections.   Prolactin     Status: None   Collection Time: 03/23/15  1:19 PM  Result Value Ref Range   Prolactin 8.4 4.0 -  15.2 ng/mL    Comment: (NOTE) Performed At: [facility] [place], [place] [id number] Lindon Romp MD JS:[id number]   Urine culture     Status: None (Preliminary result)   Collection Time: 03/23/15  3:25 PM  Result Value Ref Range   Specimen Description URINE, RANDOM    Special Requests NONE    Culture NO GROWTH < 24 HOURS    Report Status PENDING   Troponin I     Status: None   Collection Time: 03/23/15  6:02 PM  Result Value Ref Range   Troponin I <0.03 <0.031 ng/mL    Comment:        NO INDICATION OF MYOCARDIAL INJURY.   Osmolality     Status: None   Collection Time: 03/23/15  6:02 PM  Result Value Ref Range   Osmolality 295 275 - 295 mOsm/kg  Culture, blood (routine x 2)     Status: None (Preliminary result)   Collection Time: 03/23/15  6:02 PM  Result Value Ref Range   Specimen Description BLOOD RIGHT FATTY CASTS    Special Requests BOTTLES DRAWN AEROBIC AND ANAEROBIC 4CC    Culture NO GROWTH < 24 HOURS    Report Status PENDING   Culture, blood (routine x 2)     Status: None (Preliminary result)   Collection Time: 03/23/15  6:02 PM  Result Value Ref Range   Specimen Description BLOOD LEFT ASSIST CONTROL    Special Requests BOTTLES DRAWN AEROBIC AND ANAEROBIC 4CC    Culture NO GROWTH < 24 HOURS    Report Status PENDING   Magnesium     Status: None   Collection Time: 03/23/15  6:02 PM  Result Value Ref Range   Magnesium 2.1 1.7 - 2.4 mg/dL  Phosphorus     Status: None   Collection Time: 03/23/15  6:02 PM  Result Value Ref Range   Phosphorus 3.6 2.5 - 4.6 mg/dL  Troponin I     Status: None   Collection Time: 03/24/15 12:04 AM  Result Value Ref Range    Troponin I <0.03 <0.031 ng/mL    Comment:        NO INDICATION OF MYOCARDIAL INJURY.   Comprehensive metabolic panel     Status: Abnormal   Collection Time: 03/24/15  4:01 AM  Result Value Ref Range   Sodium 138 135 - 145 mmol/L   Potassium 3.2 (L) 3.5 - 5.1 mmol/L   Chloride 103 101 - 111 mmol/L   CO2 26 22 - 32 mmol/L   Glucose, Bld 128 (H) 65 - 99 mg/dL   BUN 13 6 - 20 mg/dL   Creatinine, Ser 1.03 0.61 - 1.24 mg/dL   Calcium 8.5 (L) 8.9 - 10.3 mg/dL   Total Protein 6.5 6.5 - 8.1 g/dL   Albumin 3.8 3.5 - 5.0 g/dL   AST 20 15 - 41 U/L   ALT 21 17 - 63 U/L   Alkaline Phosphatase 74 38 - 126 U/L   Total Bilirubin 0.6 0.3 - 1.2 mg/dL   GFR calc non Af Amer >60 >60 mL/min   GFR calc Af Amer >60 >60 mL/min    Comment: (NOTE) The eGFR has been calculated using the CKD EPI equation. This calculation has not been validated in all clinical situations. eGFR's persistently <60 mL/min signify possible Chronic Kidney Disease.    Anion gap 9 5 - 15  CBC     Status: Abnormal   Collection Time: 03/24/15  4:01 AM  Result Value Ref Range   WBC 10.6 3.8 - 10.6 K/uL   RBC 3.98 (L) 4.40 - 5.90 MIL/uL   Hemoglobin 12.9 (L) 13.0 - 18.0 g/dL   HCT 38.4 (L) 40.0 - 52.0 %   MCV 96.6 80.0 - 100.0 fL   MCH 32.5 26.0 - 34.0 pg   MCHC 33.6 32.0 - 36.0 g/dL   RDW 14.7 (H) 11.5 - 14.5 %   Platelets 194 150 - 440 K/uL  Protime-INR     Status: None   Collection Time: 03/24/15  4:01 AM  Result Value Ref Range   Prothrombin Time 13.7 11.4 - 15.0 seconds   INR 1.03   TSH     Status: None   Collection Time: 03/24/15  4:01 AM  Result Value Ref Range   TSH 1.902 0.350 - 4.500 uIU/mL    Current Facility-Administered Medications  Medication Dose Route Frequency Provider Last Rate Last Dose  . acetaminophen (TYLENOL) tablet 650 mg  650 mg Oral Q6H PRN Nicholes Mango, MD   650 mg at 03/24/15 0756   Or  . acetaminophen (TYLENOL) suppository 650 mg  650 mg Rectal Q6H PRN Nicholes Mango, MD      . cephALEXin  (KEFLEX) capsule 500 mg  500 mg Oral Q12H Fritzi Mandes, MD   500 mg at 03/24/15 1207  . enoxaparin (LOVENOX) injection 40 mg  40 mg Subcutaneous Q12H Nicholes Mango, MD   40 mg at 03/24/15 0944  . metoprolol (LOPRESSOR) injection 5 mg  5 mg Intravenous Q6H PRN Nicholes Mango, MD      . nicotine (NICODERM CQ - dosed in mg/24 hours) patch 21 mg  21 mg Transdermal Daily Lance Coon, MD   21 mg at 03/24/15 0452  . ondansetron (ZOFRAN) tablet 4 mg  4 mg Oral Q6H PRN Nicholes Mango, MD       Or  . ondansetron (ZOFRAN) injection 4 mg  4 mg Intravenous Q6H PRN Aruna Gouru, MD      . potassium chloride SA (K-DUR,KLOR-CON) CR tablet 40 mEq  40 mEq Oral BID Laverle Hobby, MD   40 mEq at 03/24/15 0944  . sodium chloride 0.9 % injection 3 mL  3 mL Intravenous Q12H Nicholes Mango, MD   3 mL at 03/24/15 1000  . traZODone (DESYREL) tablet 50 mg  50 mg Oral QHS PRN Lance Coon, MD   50 mg at 03/23/15 2149    Musculoskeletal: Strength & Muscle Tone: decreased Gait & Station: unable to stand Patient leans: N/A  Psychiatric Specialty Exam: Review of Systems  HENT: Negative.   Eyes: Negative.   Respiratory: Negative.   Cardiovascular: Negative.   Gastrointestinal: Negative.   Musculoskeletal: Negative.        Pain his legs apparently related to the recent fall  Skin: Negative.   Neurological: Positive for weakness.  Psychiatric/Behavioral: Positive for depression, memory loss and substance abuse. Negative for suicidal ideas and hallucinations. The patient is nervous/anxious. The patient does not have insomnia.     Blood pressure 136/74, pulse 109, temperature 98.8 F (37.1 C), temperature source Oral, resp. rate 17, height 6' 1"  (1.854 m), weight 163.1 kg (359 lb 9.1 oz), SpO2 97 %.Body mass index is 47.45 kg/(m^2).  General Appearance: Disheveled  Eye Sport and exercise psychologist::  Fair  Speech:  Clear and Coherent  Volume:  Decreased  Mood:  Anxious and Dysphoric  Affect:  Congruent  Thought Process:  Goal Directed   Orientation:  Full (Time, Place, and Person)  Thought Content:  Negative  Suicidal Thoughts:  No  Homicidal Thoughts:  No  Memory:  Immediate;   Good Recent;   Fair Remote;   Fair  Judgement:  Fair  Insight:  Fair  Psychomotor Activity:  Decreased  Concentration:  Fair  Recall:  [employer] of Knowledge:Fair  Language: Fair  Akathisia:  No  Handed:  Right  AIMS (if indicated):     Assets:  Communication Skills Desire for Improvement Housing Social Support  ADL's:  Intact  Cognition: WNL  Sleep:      Treatment Plan Summary: Plan 34 year old man with multiple psychiatric problems. As far as his acute presentation with benzodiazepine intoxication that is resolved. He is not currently delirious. Does not appear like he would require any further specific treatment. Longer standing issues of benzodiazepine abuse should be addressed by his outpatient psychiatrist. I have spoken to the inpatient hospitalist and advised her not to give the patient any additional sedating medicine prescriptions at discharge. Patient has been counseled about the dangers of overuse of benzodiazepines. He is swearing that he is not abusing alcohol but I have repeated to him the importance of never combining alcohol with benzodiazepines. As far as his anxiety and depression we discussed the current treatment plan. No indication to change any medicines here in the hospital. Does not meet commitment criteria. No evidence of acute suicidality now. He can be released home and will follow-up with his outpatient psychiatrist.  Disposition: Patient does not meet criteria for psychiatric inpatient admission. Supportive therapy provided about ongoing stressors.  John Clapacs 03/24/2015 12:41 PM

## 2015-03-24 NOTE — Care Management Note (Signed)
Case Management Note  Patient Details  Name: Ian [name] MRN: [id number] Date of Birth: 06/07/1980  Subjective/Objective:    Patient is uninsured and has no PCP therefore is not eligible for home PT.               Action/Plan:   Expected Discharge Date:                  Expected Discharge Plan:     In-House Referral:     Discharge planning Services     Post Acute Care Choice:    Choice offered to:     DME Arranged:    DME Agency:     HH Arranged:    HH Agency:     Status of Service:     Medicare Important Message Given:    Date Medicare IM Given:    Medicare IM give by:    Date Additional Medicare IM Given:    Additional Medicare Important Message give by:     If discussed at Long Length of Stay Meetings, dates discussed:    Additional Comments:  Marily MemosLisa M Ayianna Darnold, RN 03/24/2015, 3:22 PM

## 2015-03-24 NOTE — Discharge Summary (Signed)
[facility] [place] [facility]   PATIENT NAME: Ian Duckingaul Baar    MR#:  [id number]  DATE OF BIRTH:  08/17/1980  DATE OF ADMISSION:  03/23/2015 ADMITTING PHYSICIAN: Ramonita LabAruna Gouru, MD  DATE OF DISCHARGE: 03/24/15  PRIMARY CARE PHYSICIAN: No primary care provider on file.    ADMISSION DIAGNOSIS:  Stupor [R40.1] Confusion [R41.0] Chest pain [R07.9] Overdose, undetermined intent, initial encounter [T50.904A]  DISCHARGE DIAGNOSIS:  Valium overuse Left LE cellulitis Chronic anxiety (Panic disorder with agoraphobia)  SECONDARY DIAGNOSIS:   Past Medical History  Diagnosis Date  . Hypertension   . Hypercholesteremia   . Anxiety   . Depression     HOSPITAL COURSE:   .AMS suspected due to valium overuse  Mentation back to normal MRI and EEG negative CT head is negative Urine drug screen is positive for benzos but patient is taking benzos at home Seen by Dr clapacs ok to go home in his meds and f/u Dr Leory Plowmanalton williams  2. Left distal LE mild cellulitis Po keflex PT to ambulate pt. Pt advised to take care of dry skin in the foot at home  3. History of obstructive sleep apnea and continues to smoke Smoking cessation advised about 4 mins spent  4 history of depression and anxiety Sees Dr. Chrissie NoaWilliam psychiatrist as an outpatient. As above. Ok to go home with out pt f/u  5. History of on alcohol abuse ETOH <5 No s/o withdrawal  Overall stable D/c foely Ambulate D/c home later today CONSULTS OBTAINED:  Treatment Team:  Ramonita LabAruna Gouru, MD Mellody DrownMatthew Smith, MD Shane CrutchPradeep Ramachandran, MD Audery AmelJohn T Clapacs, MD  DRUG ALLERGIES:  No Known Allergies  DISCHARGE MEDICATIONS:   Current Discharge Medication List    START taking these medications   Details  cephALEXin (KEFLEX) 500 MG capsule Take 1 capsule (500 mg total) by mouth every 12 (twelve) hours. Qty: 16 capsule, Refills: 0      CONTINUE these medications which have NOT CHANGED   Details   diazepam (VALIUM) 5 MG tablet Take 1 tablet (5 mg total) by mouth every 6 (six) hours as needed for anxiety. Qty: 120 tablet, Refills: 3    traZODone (DESYREL) 100 MG tablet Take 2 tablets (200 mg total) by mouth at bedtime as needed for sleep. Qty: 60 tablet, Refills: 3    citalopram (CELEXA) 40 MG tablet Take 1 tablet (40 mg total) by mouth daily. Qty: 30 tablet, Refills: 3    QUEtiapine (SEROQUEL) 400 MG tablet Take 1 tablet (400 mg total) by mouth at bedtime. Qty: 30 tablet, Refills: 3    zaleplon (SONATA) 10 MG capsule Take 1 capsule (10 mg total) by mouth at bedtime as needed for sleep. Qty: 30 capsule, Refills: 1        If you experience worsening of your admission symptoms, develop shortness of breath, life threatening emergency, suicidal or homicidal thoughts you must seek medical attention immediately by calling 911 or calling your MD immediately  if symptoms less severe.  You Must read complete instructions/literature along with all the possible adverse reactions/side effects for all the Medicines you take and that have been prescribed to you. Take any new Medicines after you have completely understood and accept all the possible adverse reactions/side effects.   Please note  You were cared for by a hospitalist during your hospital stay. If you have any questions about your discharge medications or the care you received while you were in the hospital after you are discharged, you  can call the unit and asked to speak with the hospitalist on call if the hospitalist that took care of you is not available. Once you are discharged, your primary care physician will handle any further medical issues. Please note that NO REFILLS for any discharge medications will be authorized once you are discharged, as it is imperative that you return to your primary care physician (or establish a relationship with a primary care physician if you do not have one) for your aftercare needs so that they  can reassess your need for medications and monitor your lab values.  DATA REVIEW:   CBC   Recent Labs Lab 03/24/15 0401  WBC 10.6  HGB 12.9*  HCT 38.4*  PLT 194    Chemistries   Recent Labs Lab 03/23/15 1802 03/24/15 0401  NA  --  138  K  --  3.2*  CL  --  103  CO2  --  26  GLUCOSE  --  128*  BUN  --  13  CREATININE  --  1.03  CALCIUM  --  8.5*  MG 2.1  --   AST  --  20  ALT  --  21  ALKPHOS  --  74  BILITOT  --  0.6    Microbiology Results   Recent Results (from the past 240 hour(s))  MRSA PCR Screening     Status: None   Collection Time: 03/23/15  1:10 PM  Result Value Ref Range Status   MRSA by PCR NEGATIVE NEGATIVE Final    Comment:        The GeneXpert MRSA Assay (FDA approved for NASAL specimens only), is one component of a comprehensive MRSA colonization surveillance program. It is not intended to diagnose MRSA infection nor to guide or monitor treatment for MRSA infections.   Urine culture     Status: None (Preliminary result)   Collection Time: 03/23/15  3:25 PM  Result Value Ref Range Status   Specimen Description URINE, RANDOM  Final   Special Requests NONE  Final   Culture NO GROWTH < 24 HOURS  Final   Report Status PENDING  Incomplete  Culture, blood (routine x 2)     Status: None (Preliminary result)   Collection Time: 03/23/15  6:02 PM  Result Value Ref Range Status   Specimen Description BLOOD RIGHT FATTY CASTS  Final   Special Requests BOTTLES DRAWN AEROBIC AND ANAEROBIC 4CC  Final   Culture NO GROWTH < 24 HOURS  Final   Report Status PENDING  Incomplete  Culture, blood (routine x 2)     Status: None (Preliminary result)   Collection Time: 03/23/15  6:02 PM  Result Value Ref Range Status   Specimen Description BLOOD LEFT ASSIST CONTROL  Final   Special Requests BOTTLES DRAWN AEROBIC AND ANAEROBIC 4CC  Final   Culture NO GROWTH < 24 HOURS  Final   Report Status PENDING  Incomplete    RADIOLOGY:  Ct Head Wo  Contrast  03/23/2015  CLINICAL DATA:  Patient fell in the back yard 10 hours ago, with subsequent mental status changes and disorientation. Somnolent. EXAM: CT HEAD WITHOUT CONTRAST TECHNIQUE: Contiguous axial images were obtained from the base of the skull through the vertex without intravenous contrast. COMPARISON:  02/19/2011 FINDINGS: The brain has a normal appearance without evidence of malformation, atrophy, old or acute infarction, mass lesion, hemorrhage, hydrocephalus or extra-axial collection. The calvarium is unremarkable. The paranasal sinuses, middle ears and mastoids are clear. IMPRESSION: Normal head  CT Electronically Signed   By: Paulina Fusi M.D.   On: 03/23/2015 08:46   Mr Laqueta Jean RU Contrast  03/23/2015  CLINICAL DATA:  34 year old male with altered mental status and unresponsiveness. Possible fall. Heavy alcohol use. Subsequent encounter. EXAM: MRI HEAD WITHOUT AND WITH CONTRAST TECHNIQUE: Multiplanar, multiecho pulse sequences of the brain and surrounding structures were obtained without and with intravenous contrast. CONTRAST:  20mL MULTIHANCE GADOBENATE DIMEGLUMINE 529 MG/ML IV SOLN COMPARISON:  03/23/2015 head CT. FINDINGS: Exam is motion degraded. No acute infarct. No intracranial hemorrhage. No hydrocephalus. No MR evidence of Wernicke's encephalopathy. No intracranial mass or abnormal enhancement. Major intracranial vascular structures are patent. Cervical medullary junction, pituitary region, pineal region and orbital structures unremarkable. Minimal paranasal sinus mucosal thickening. IMPRESSION: Exam is motion degraded. No acute infarct. No intracranial hemorrhage. No hydrocephalus. No MR evidence of Wernicke's encephalopathy. No intracranial mass or abnormal enhancement. Electronically Signed   By: Lacy Duverney M.D.   On: 03/23/2015 17:27     Management plans discussed with the patient, family and they are in agreement.  CODE STATUS:     Code Status Orders         Start     Ordered   03/23/15 1327  Full code   Continuous     03/23/15 1326      TOTAL TIME TAKING CARE OF THIS PATIENT: 40 minutes.    Ian [name] M.D on 03/24/2015 at 12:28 PM  Between 7am to 6pm - Pager - [phone] After 6pm go to [email or web address] - password [email or web address] [facility]  [facility]  Office  [phone]  CC: Primary care physician; No primary care provider on file.

## 2015-03-25 LAB — URINE CULTURE: CULTURE: NO GROWTH

## 2015-03-26 ENCOUNTER — Ambulatory Visit: Payer: Self-pay | Admitting: Psychiatry

## 2015-03-28 LAB — CULTURE, BLOOD (ROUTINE X 2)
CULTURE: NO GROWTH
Culture: NO GROWTH

## 2015-04-14 ENCOUNTER — Ambulatory Visit: Payer: Self-pay | Admitting: Psychiatry

## 2015-05-05 ENCOUNTER — Encounter: Payer: Self-pay | Admitting: Psychiatry

## 2015-05-05 ENCOUNTER — Ambulatory Visit (INDEPENDENT_AMBULATORY_CARE_PROVIDER_SITE_OTHER): Payer: Self-pay | Admitting: Psychiatry

## 2015-05-05 VITALS — BP 138/98 | HR 124 | Temp 97.9°F | Ht 73.0 in | Wt 347.0 lb

## 2015-05-05 DIAGNOSIS — F331 Major depressive disorder, recurrent, moderate: Secondary | ICD-10-CM

## 2015-05-05 DIAGNOSIS — F4001 Agoraphobia with panic disorder: Secondary | ICD-10-CM

## 2015-05-05 MED ORDER — TRAZODONE HCL 100 MG PO TABS: 200.0000 mg | ORAL_TABLET | Freq: Every evening | ORAL | Status: DC | PRN

## 2015-05-05 MED ORDER — DIAZEPAM 5 MG PO TABS: 5.0000 mg | ORAL_TABLET | Freq: Four times a day (QID) | ORAL | Status: DC | PRN

## 2015-05-05 MED ORDER — CITALOPRAM HYDROBROMIDE 40 MG PO TABS: 40.0000 mg | ORAL_TABLET | Freq: Every day | ORAL | Status: DC

## 2015-05-05 MED ORDER — QUETIAPINE FUMARATE 400 MG PO TABS: 400.0000 mg | ORAL_TABLET | Freq: Every day | ORAL | Status: DC

## 2015-05-05 NOTE — Progress Notes (Signed)
BH MD/PA/NP OP Progress Note  05/05/2015 9:27 AM Ian [name]  MRN:  [id number]  Subjective:  Patient returns for follow-up of his major depressive disorder and anxiety. We spent time discussing the incident that occurred at the end of October with the patient taking too much of his Valium. On his own accord the patient brought up the incident and was fearful his Valium would be discontinued. Patient stated that he met a previous romantic contact at a Halloween celebration at [employer]. He indicated there were some plans for him to see this woman's daughter who he had developed a close relationship with. He states that he took 4 of his Valium and then took another 3 to try to deal with the anxiety around being out with this woman and the daughter. I brought patient's father ran to get collateral information. Patient's father indicated a similar story. He indicated he felt his son was having intense anxiety and was taking the medications to try to be social with the woman and the women's daughter who patient had become close to. The patient's father indicates he had no concerns and there are no indications that patient was suicidal. I discussed with patient, patient's mother that I recommend a family member keep the patient's Valium and administer it. Patient and patient's father were agreeable to this plan.  I also spent time discussing alternative medications, antidepressants, BuSpar and Vistaril. Patient indicates that he has been on BuSpar and Vistaril and they are not effective. Outside of the incident in October patient reports being stable on the current medications.  Chief Complaint:  Chief Complaint    Follow-up; Medication Refill    sleep Visit Diagnosis:  No diagnosis found.  Past Medical History:  Past Medical History  Diagnosis Date  . Hypertension   . Hypercholesteremia   . Anxiety   . Depression    History reviewed. No pertinent past surgical history. Family History:  Family  History  Problem Relation Age of Onset  . Heart attack Mother   . Hypertension Mother   . Hypercholesterolemia Mother   . Heart attack Father   . Hypertension Father   . Hypercholesterolemia Father   . Depression Maternal Uncle   . Suicidality Maternal Uncle   . Depression Cousin   . Suicidality Cousin    Social History:  Social History   Social History  . Marital Status: Single    Spouse Name: N/A  . Number of Children: N/A  . Years of Education: N/A   Social History Main Topics  . Smoking status: Former Smoker -- 0.75 packs/day for 16 years    Types: Cigarettes    Quit date: 04/05/2015  . Smokeless tobacco: Never Used  . Alcohol Use: No  . Drug Use: No  . Sexual Activity: No   Other Topics Concern  . None   Social History Narrative   Additional History:   Assessment:   Musculoskeletal: Strength & Muscle Tone: within normal limits Gait & Station: normal Patient leans: N/A  Psychiatric Specialty Exam: Anxiety Symptoms include nervous/anxious behavior. Patient reports no insomnia or suicidal ideas.      Review of Systems  Psychiatric/Behavioral: Negative for depression, suicidal ideas, hallucinations, memory loss and substance abuse. The patient is nervous/anxious. The patient does not have insomnia.     Blood pressure 138/98, pulse 124, temperature 97.9 F (36.6 C), temperature source Tympanic, height _0  (1.854 m), weight 347 lb (157.398 kg), SpO2 94 %.Body mass index is 45.79 kg/(m^2).  General Appearance:  Well Groomed  Eye Contact:  Good  Speech:  Clear and Coherent and Normal Rate  Volume:  Normal  Mood:  Okay  Affect:  Patient was nervous about the possibility of losing his Valium, however once I discussed that we could make a plan for his parents to be involved in his medication management his mood brightened.  Thought Process:  Linear and Logical  Orientation:  Full (Time, Place, and Person)  Thought Content:  Negative  Suicidal Thoughts:  No   Homicidal Thoughts:  No  Memory:  Immediate;   Good Recent;   Good Remote;   Good  Judgement:  Good  Insight:  Good  Psychomotor Activity:  Negative  Concentration:  Good  Recall:  Good  Fund of Knowledge: Good  Language: Good  Akathisia:  Negative  Handed:  Rightunknown  AIMS (if indicated):  Done 05/05/15 normal  Assets:  Communication Skills Desire for Improvement Social Support  ADL's:  Intact  Cognition: WNL  Sleep:  Poor, good with medication   Is the patient at risk to self?  No. Has the patient been a risk to self in the past 6 months?  No. Has the patient been a risk to self within the distant past?  No. Is the patient a risk to others?  No. Has the patient been a risk to others in the past 6 months?  No. Has the patient been a risk to others within the distant past?  No.  Current Medications: Current Outpatient Prescriptions  Medication Sig Dispense Refill  . citalopram (CELEXA) 40 MG tablet Take 1 tablet (40 mg total) by mouth daily. 30 tablet 5  . diazepam (VALIUM) 5 MG tablet Take 1 tablet (5 mg total) by mouth every 6 (six) hours as needed for anxiety. 120 tablet 4  . QUEtiapine (SEROQUEL) 400 MG tablet Take 1 tablet (400 mg total) by mouth at bedtime. 30 tablet 5  . traZODone (DESYREL) 100 MG tablet Take 2 tablets (200 mg total) by mouth at bedtime as needed for sleep. 60 tablet 5   No current facility-administered medications for this visit.    Medical Decision Making:  Established Problem, Stable/Improving (1) patient is doing well on the current regimen. His only issue right now is insomnia and a desire to adjust the sleep regimen to decrease side effects from weight gain related to Seroquel and find alternative medications to aid him in his insomnia.  Treatment Plan Summary:Medication management We'll continue his Celexa 40 mg daily. He will continue his diazepam at 5 mg  6 hours as needed for anxiety. However we have implemented a plan in which  patient's parents who he lives with will hold and administer his Valium.  He will continue his Seroquel 400 mg at bedtime.we'll continue his trazodone 200 mg at bedtime. Patient indicates that his primary care has started him on propranolol as well as atorvastatin.  Major depressive disorder, recurrent, moderate-continue Celexa with augmentation with Seroquel.  Panic Disorder with agoraphobia-continue Celexa and diazepam as above.   Patient has labs and TSH from 03/24/2015 that was normal at 1.9, prolactin level from 03/23/2015 which was normal at 8.4, he has a blood glucose from 03/23/2015 of 109. He states he had a lipid panel drawn his primary care doctor's office 4 months ago. He signed a release of information so we can obtain those.   I discussed with patient my departure in February. I explained would be a doctor replacing me at this clinic. He will follow-up  with me in 8 weeks. He's been encouraged call any questions or concerns prior to his next appointment.   Faith Rogue 05/05/2015, 9:27 AM

## 2015-05-11 ENCOUNTER — Ambulatory Visit: Payer: Self-pay | Admitting: Psychiatry

## 2015-05-12 ENCOUNTER — Ambulatory Visit: Payer: Self-pay | Admitting: Psychiatry

## 2015-05-20 NOTE — Progress Notes (Signed)
Pharmacy notified.

## 2015-06-09 ENCOUNTER — Ambulatory Visit (INDEPENDENT_AMBULATORY_CARE_PROVIDER_SITE_OTHER): Payer: Self-pay | Admitting: Psychiatry

## 2015-06-09 ENCOUNTER — Encounter: Payer: Self-pay | Admitting: Psychiatry

## 2015-06-09 VITALS — BP 148/98 | HR 116 | Temp 97.6°F | Ht 73.0 in | Wt 363.4 lb

## 2015-06-09 DIAGNOSIS — F331 Major depressive disorder, recurrent, moderate: Secondary | ICD-10-CM

## 2015-06-09 DIAGNOSIS — F4001 Agoraphobia with panic disorder: Secondary | ICD-10-CM

## 2015-06-09 NOTE — Progress Notes (Signed)
Pharmacy notified [facility] MD/PA/NP OP Progress Note  06/09/2015 10:27 AM TREASURE INGRUM  MRN:  [id number]  Subjective:  Patient returns for follow-up of his major depressive disorder and anxiety. Patient indicates he is been stable. He states he still has anxiety but that it is generally stabilized with his Valium 4 times a day. He indicates his her and still hold and administer his Valium to him. He states that he has been fine as long as he stays in his house and his house. He feels that he is making some progress with paying off his hospital bills. We spent some time discussing that he should engage in therapy to address his anxiety which is significant. Patient agrees with this but states he does need to pay off more hospital bills before he can afford a therapist. I've given a list of therapists in the area and he indicated he will explore this and this is in his plans.  Chief Complaint: I'm all right Chief Complaint    Follow-up; Medication Refill    sleep Visit Diagnosis:     ICD-9-CM ICD-10-CM   1. Panic disorder with agoraphobia 300.21 F40.01   2. Major depressive disorder, recurrent episode, moderate (HCC) 296.32 F33.1     Past Medical History:  Past Medical History  Diagnosis Date  . Hypertension   . Hypercholesteremia   . Anxiety   . Depression    History reviewed. No pertinent past surgical history. Family History:  Family History  Problem Relation Age of Onset  . Heart attack Mother   . Hypertension Mother   . Hypercholesterolemia Mother   . Heart attack Father   . Hypertension Father   . Hypercholesterolemia Father   . Depression Maternal Uncle   . Suicidality Maternal Uncle   . Depression Cousin   . Suicidality Cousin    Social History:  Social History   Social History  . Marital Status: Single    Spouse Name: N/A  . Number of Children: N/A  . Years of Education: N/A   Social History Main Topics  . Smoking status: Former Smoker -- 0.75 packs/day for 16  years    Types: Cigarettes    Quit date: 04/05/2015  . Smokeless tobacco: Never Used  . Alcohol Use: No  . Drug Use: No  . Sexual Activity: No   Other Topics Concern  . None   Social History Narrative   Additional History:   Assessment:   Musculoskeletal: Strength & Muscle Tone: within normal limits Gait & Station: normal Patient leans: N/A  Psychiatric Specialty Exam: Anxiety Symptoms include nervous/anxious behavior (at baseline). Patient reports no insomnia or suicidal ideas.      Review of Systems  Psychiatric/Behavioral: Negative for depression, suicidal ideas, hallucinations, memory loss and substance abuse. The patient is nervous/anxious (at baseline). The patient does not have insomnia.   All other systems reviewed and are negative.   Blood pressure 148/98, pulse 116, temperature 97.6 F (36.4 C), temperature source Tympanic, height  (1.854 m), weight 363 lb 6.4 oz (164.837 kg), SpO2 93 %.Body mass index is 47.96 kg/(m^2).  General Appearance: Well Groomed  Eye Contact:  Good  Speech:  Clear and Coherent and Normal Rate  Volume:  Normal  Mood:  Okay  Affect:  Patient was nervous about the possibility of losing his Valium, however once I discussed that we could make a plan for his parents to be involved in his medication management his mood brightened.  Thought Process:  Linear and  Logical  Orientation:  Full (Time, Place, and Person)  Thought Content:  Negative  Suicidal Thoughts:  No  Homicidal Thoughts:  No  Memory:  Immediate;   Good Recent;   Good Remote;   Good  Judgement:  Good  Insight:  Good  Psychomotor Activity:  Negative  Concentration:  Good  Recall:  Good  Fund of Knowledge: Good  Language: Good  Akathisia:  Negative  Handed:  Rightunknown  AIMS (if indicated):  Done 05/05/15 normal  Assets:  Communication Skills Desire for Improvement Social Support  ADL's:  Intact  Cognition: WNL  Sleep:  Poor, good with medication   Is the  patient at risk to self?  No. Has the patient been a risk to self in the past 6 months?  No. Has the patient been a risk to self within the distant past?  No. Is the patient a risk to others?  No. Has the patient been a risk to others in the past 6 months?  No. Has the patient been a risk to others within the distant past?  No.  Current Medications: Current Outpatient Prescriptions  Medication Sig Dispense Refill  . atorvastatin (LIPITOR) 80 MG tablet Take 80 mg by mouth daily.    . citalopram (CELEXA) 40 MG tablet Take 1 tablet (40 mg total) by mouth daily. 30 tablet 5  . diazepam (VALIUM) 5 MG tablet Take 1 tablet (5 mg total) by mouth every 6 (six) hours as needed for anxiety. 120 tablet 4  . QUEtiapine (SEROQUEL) 400 MG tablet Take 1 tablet (400 mg total) by mouth at bedtime. 30 tablet 5  . traZODone (DESYREL) 100 MG tablet Take 2 tablets (200 mg total) by mouth at bedtime as needed for sleep. 60 tablet 5   No current facility-administered medications for this visit.    Medical Decision Making:  Established Problem, Stable/Improving (1) patient is doing well on the current regimen. His only issue right now is insomnia and a desire to adjust the sleep regimen to decrease side effects from weight gain related to Seroquel and find alternative medications to aid him in his insomnia.  Treatment Plan Summary:Medication management We'll continue his Celexa 40 mg daily. He will continue his diazepam at 5 mg  6 hours as needed for anxiety. However we have implemented a plan in which patient's parents who he lives with will hold and administer his Valium.  He will continue his Seroquel 400 mg at bedtime.we'll continue his trazodone 200 mg at bedtime. Patient indicates that his primary care has started him on propranolol as well as atorvastatin.  Major depressive disorder, recurrent, moderate-continue Celexa with augmentation with Seroquel.  Panic Disorder with agoraphobia-continue Celexa and  diazepam as above.   He is already aware of my departure in February.He's been encouraged call any questions or concerns prior to his next appointment. He is aware of follow-up with another psychiatrist within this practice.   Wallace Going 06/09/2015, 10:27 AM

## 2015-06-19 ENCOUNTER — Ambulatory Visit (INDEPENDENT_AMBULATORY_CARE_PROVIDER_SITE_OTHER): Payer: Self-pay

## 2015-06-19 ENCOUNTER — Emergency Department: Payer: Self-pay

## 2015-06-19 ENCOUNTER — Inpatient Hospital Stay
Admission: EM | Admit: 2015-06-19 | Discharge: 2015-06-21 | DRG: 917 | Disposition: A | Discharge status: Home / Self Care | Payer: Self-pay | Attending: Internal Medicine | Admitting: Internal Medicine

## 2015-06-19 ENCOUNTER — Encounter: Payer: Self-pay | Admitting: Internal Medicine

## 2015-06-19 ENCOUNTER — Ambulatory Visit
Admission: EM | Admit: 2015-06-19 | Discharge: 2015-06-19 | Disposition: A | Discharge status: Home / Self Care | Payer: Self-pay | Attending: Family Medicine | Admitting: Family Medicine

## 2015-06-19 DIAGNOSIS — R4182 Altered mental status, unspecified: Secondary | ICD-10-CM | POA: Diagnosis present

## 2015-06-19 DIAGNOSIS — S82842A Displaced bimalleolar fracture of left lower leg, initial encounter for closed fracture: Secondary | ICD-10-CM

## 2015-06-19 DIAGNOSIS — Y929 Unspecified place or not applicable: Secondary | ICD-10-CM

## 2015-06-19 DIAGNOSIS — S8252XA Displaced fracture of medial malleolus of left tibia, initial encounter for closed fracture: Secondary | ICD-10-CM | POA: Diagnosis present

## 2015-06-19 DIAGNOSIS — Z4659 Encounter for fitting and adjustment of other gastrointestinal appliance and device: Secondary | ICD-10-CM

## 2015-06-19 DIAGNOSIS — E872 Acidosis: Secondary | ICD-10-CM | POA: Diagnosis present

## 2015-06-19 DIAGNOSIS — R4189 Other symptoms and signs involving cognitive functions and awareness: Secondary | ICD-10-CM

## 2015-06-19 DIAGNOSIS — X58XXXA Exposure to other specified factors, initial encounter: Secondary | ICD-10-CM | POA: Diagnosis present

## 2015-06-19 DIAGNOSIS — Z79899 Other long term (current) drug therapy: Secondary | ICD-10-CM

## 2015-06-19 DIAGNOSIS — T424X1A Poisoning by benzodiazepines, accidental (unintentional), initial encounter: Principal | ICD-10-CM | POA: Diagnosis present

## 2015-06-19 DIAGNOSIS — E7800 Pure hypercholesterolemia, unspecified: Secondary | ICD-10-CM | POA: Diagnosis present

## 2015-06-19 DIAGNOSIS — E875 Hyperkalemia: Secondary | ICD-10-CM | POA: Diagnosis present

## 2015-06-19 DIAGNOSIS — F329 Major depressive disorder, single episode, unspecified: Secondary | ICD-10-CM | POA: Diagnosis present

## 2015-06-19 DIAGNOSIS — Z6841 Body Mass Index (BMI) 40.0 and over, adult: Secondary | ICD-10-CM

## 2015-06-19 DIAGNOSIS — F419 Anxiety disorder, unspecified: Secondary | ICD-10-CM | POA: Diagnosis present

## 2015-06-19 DIAGNOSIS — G9341 Metabolic encephalopathy: Secondary | ICD-10-CM | POA: Diagnosis present

## 2015-06-19 DIAGNOSIS — Z87891 Personal history of nicotine dependence: Secondary | ICD-10-CM

## 2015-06-19 DIAGNOSIS — S82302A Unspecified fracture of lower end of left tibia, initial encounter for closed fracture: Secondary | ICD-10-CM | POA: Diagnosis present

## 2015-06-19 DIAGNOSIS — F411 Generalized anxiety disorder: Secondary | ICD-10-CM | POA: Diagnosis present

## 2015-06-19 DIAGNOSIS — J9600 Acute respiratory failure, unspecified whether with hypoxia or hypercapnia: Secondary | ICD-10-CM

## 2015-06-19 DIAGNOSIS — Z0189 Encounter for other specified special examinations: Secondary | ICD-10-CM

## 2015-06-19 DIAGNOSIS — E785 Hyperlipidemia, unspecified: Secondary | ICD-10-CM | POA: Diagnosis present

## 2015-06-19 DIAGNOSIS — I10 Essential (primary) hypertension: Secondary | ICD-10-CM | POA: Diagnosis present

## 2015-06-19 DIAGNOSIS — S82892A Other fracture of left lower leg, initial encounter for closed fracture: Secondary | ICD-10-CM

## 2015-06-19 DIAGNOSIS — F410 Panic disorder [episodic paroxysmal anxiety] without agoraphobia: Secondary | ICD-10-CM | POA: Diagnosis present

## 2015-06-19 DIAGNOSIS — J9602 Acute respiratory failure with hypercapnia: Secondary | ICD-10-CM | POA: Diagnosis present

## 2015-06-19 LAB — CBC
HCT: 45.4 % (ref 40.0–52.0)
HEMOGLOBIN: 15 g/dL (ref 13.0–18.0)
MCH: 31.8 pg (ref 26.0–34.0)
MCHC: 33 g/dL (ref 32.0–36.0)
MCV: 96.3 fL (ref 80.0–100.0)
PLATELETS: 280 10*3/uL (ref 150–440)
RBC: 4.71 MIL/uL (ref 4.40–5.90)
RDW: 14.8 % — ABNORMAL HIGH (ref 11.5–14.5)
WBC: 12.1 10*3/uL — ABNORMAL HIGH (ref 3.8–10.6)

## 2015-06-19 LAB — COMPREHENSIVE METABOLIC PANEL
ALT: 49 U/L (ref 17–63)
ANION GAP: 12 (ref 5–15)
AST: 35 U/L (ref 15–41)
Albumin: 5 g/dL (ref 3.5–5.0)
Alkaline Phosphatase: 116 U/L (ref 38–126)
BUN: 14 mg/dL (ref 6–20)
CHLORIDE: 101 mmol/L (ref 101–111)
CO2: 26 mmol/L (ref 22–32)
Calcium: 10.3 mg/dL (ref 8.9–10.3)
Creatinine, Ser: 1.57 mg/dL — ABNORMAL HIGH (ref 0.61–1.24)
GFR, EST NON AFRICAN AMERICAN: 56 mL/min — AB (ref >60)
Glucose, Bld: 123 mg/dL — ABNORMAL HIGH (ref 65–99)
POTASSIUM: 5.8 mmol/L — AB (ref 3.5–5.1)
SODIUM: 139 mmol/L (ref 135–145)
Total Bilirubin: 0.6 mg/dL (ref 0.3–1.2)
Total Protein: 8.8 g/dL — ABNORMAL HIGH (ref 6.5–8.1)

## 2015-06-19 LAB — BLOOD GAS, ARTERIAL
ACID-BASE DEFICIT: 2.5 mmol/L — AB (ref 0.0–2.0)
BICARBONATE: 25 meq/L (ref 21.0–28.0)
FIO2: 0.5
MECHVT: 500 mL
Mechanical Rate: 16
O2 SAT: 99.1 %
PATIENT TEMPERATURE: 37
PCO2 ART: 52 mmHg — AB (ref 32.0–48.0)
PEEP: 5 cmH2O
PH ART: 7.29 — AB (ref 7.350–7.450)
PO2 ART: 151 mmHg — AB (ref 83.0–108.0)

## 2015-06-19 LAB — URINE DRUG SCREEN, QUALITATIVE (ARMC ONLY)
Amphetamines, Ur Screen: POSITIVE — AB
BARBITURATES, UR SCREEN: NOT DETECTED
Benzodiazepine, Ur Scrn: POSITIVE — AB
CANNABINOID 50 NG, UR ~~LOC~~: NOT DETECTED
COCAINE METABOLITE, UR ~~LOC~~: NOT DETECTED
MDMA (ECSTASY) UR SCREEN: NOT DETECTED
METHADONE SCREEN, URINE: NOT DETECTED
Opiate, Ur Screen: NOT DETECTED
Phencyclidine (PCP) Ur S: NOT DETECTED
TRICYCLIC, UR SCREEN: POSITIVE — AB

## 2015-06-19 LAB — URINALYSIS COMPLETE WITH MICROSCOPIC (ARMC ONLY)
BACTERIA UA: NONE SEEN
BILIRUBIN URINE: NEGATIVE
Glucose, UA: NEGATIVE mg/dL
Hgb urine dipstick: NEGATIVE
KETONES UR: NEGATIVE mg/dL
LEUKOCYTES UA: NEGATIVE
NITRITE: NEGATIVE
PROTEIN: NEGATIVE mg/dL
SPECIFIC GRAVITY, URINE: 1.01 (ref 1.005–1.030)
Squamous Epithelial / LPF: NONE SEEN
pH: 5 (ref 5.0–8.0)

## 2015-06-19 LAB — ACETAMINOPHEN LEVEL

## 2015-06-19 LAB — GLUCOSE, CAPILLARY: Glucose-Capillary: 106 mg/dL — ABNORMAL HIGH (ref 65–99)

## 2015-06-19 LAB — TROPONIN I: Troponin I: <0.03 ng/mL (ref <0.031)

## 2015-06-19 LAB — ETHANOL

## 2015-06-19 LAB — SALICYLATE LEVEL

## 2015-06-19 MED ORDER — MELOXICAM 15 MG PO TABS: 15.0000 mg | ORAL_TABLET | Freq: Every day | ORAL | Status: DC

## 2015-06-19 MED ORDER — ETOMIDATE 2 MG/ML IV SOLN
20.0000 mg | Freq: Once | INTRAVENOUS | Status: AC
  Administered 2015-06-19: 20 mg via INTRAVENOUS

## 2015-06-19 MED ORDER — NALOXONE HCL 2 MG/2ML IJ SOSY
PREFILLED_SYRINGE | INTRAMUSCULAR | Status: AC
  Filled 2015-06-19: qty 4

## 2015-06-19 MED ORDER — SUCCINYLCHOLINE CHLORIDE 20 MG/ML IJ SOLN
150.0000 mg | Freq: Once | INTRAMUSCULAR | Status: AC
  Administered 2015-06-19: 150 mg via INTRAVENOUS

## 2015-06-19 MED ORDER — PROPOFOL 1000 MG/100ML IV EMUL
5.0000 ug/kg/min | Freq: Once | INTRAVENOUS | Status: DC
  Filled 2015-06-19: qty 100

## 2015-06-19 MED ORDER — NALOXONE HCL 2 MG/2ML IJ SOSY
2.0000 mg | PREFILLED_SYRINGE | Freq: Once | INTRAMUSCULAR | Status: AC
  Administered 2015-06-19: 2 mg via INTRAVENOUS

## 2015-06-19 MED ORDER — ACETAMINOPHEN 650 MG RE SUPP: 650.0000 mg | Freq: Four times a day (QID) | RECTAL | Status: DC | PRN

## 2015-06-19 MED ORDER — SODIUM CHLORIDE 0.9% FLUSH
3.0000 mL | Freq: Two times a day (BID) | INTRAVENOUS | Status: DC
  Administered 2015-06-20 – 2015-06-21 (×2): 3 mL via INTRAVENOUS

## 2015-06-19 MED ORDER — SODIUM CHLORIDE 0.9 % IV SOLN
INTRAVENOUS | Status: DC
  Administered 2015-06-20: 23:00:00 via INTRAVENOUS

## 2015-06-19 MED ORDER — HEPARIN SODIUM (PORCINE) 5000 UNIT/ML IJ SOLN
5000.0000 [IU] | Freq: Three times a day (TID) | INTRAMUSCULAR | Status: DC
  Administered 2015-06-20 – 2015-06-21 (×5): 5000 [IU] via SUBCUTANEOUS
  Filled 2015-06-19 (×6): qty 1

## 2015-06-19 MED ORDER — ONDANSETRON HCL 4 MG/2ML IJ SOLN: 4.0000 mg | Freq: Four times a day (QID) | INTRAMUSCULAR | Status: DC | PRN

## 2015-06-19 MED ORDER — ACETAMINOPHEN 325 MG PO TABS
650.0000 mg | ORAL_TABLET | Freq: Four times a day (QID) | ORAL | Status: DC | PRN
  Administered 2015-06-20 – 2015-06-21 (×2): 650 mg via ORAL
  Filled 2015-06-19 (×2): qty 2

## 2015-06-19 MED ORDER — PROPOFOL 1000 MG/100ML IV EMUL
INTRAVENOUS | Status: AC
  Administered 2015-06-19: 20 ug/kg/min via INTRAVENOUS
  Filled 2015-06-19: qty 100

## 2015-06-19 MED ORDER — ONDANSETRON HCL 4 MG PO TABS: 4.0000 mg | ORAL_TABLET | Freq: Four times a day (QID) | ORAL | Status: DC | PRN

## 2015-06-19 MED ORDER — PROPOFOL 1000 MG/100ML IV EMUL
5.0000 ug/kg/min | Freq: Once | INTRAVENOUS | Status: AC
  Administered 2015-06-19: 20 ug/kg/min via INTRAVENOUS

## 2015-06-19 NOTE — ED Notes (Signed)
States had a seizure "due to medications that was on" on Oct. 31/2017. Admitted at that time. States left foot hit a door jam when had seizure. Doctor aware and was given antibiotic. No xray was done. Painful to bear weight

## 2015-06-19 NOTE — ED Notes (Signed)
Patient brought in by ems. Patient was found unresponsive with reparations of 3. Patient given narcan by ems. raspatory rate increased to 9. Family reported to ems that the patient has been taken narcotics for leg fracture.

## 2015-06-19 NOTE — ED Provider Notes (Signed)
CSN: [id number]     Arrival date & time 06/19/15  0901 History   First MD Initiated Contact with Patient 06/19/15 [phone]    Nurses notes were reviewed. Chief Complaint  Patient presents with  . Foot Pain   Patient is here because of left foot pain. He states 03/23/2015 he over took his medication by mistake. It was Valium and that he states he had a seizure from which is unusual to hear. He states that he had to crawl on his belly to place where he can get help. States that he called the mass came and saw him pounding his left foot against the door still. He and his father states that EMS didn't do anything to help him to come to the hospital. States the ER doctor didn't x-ray it but just put him on antibiotic for possible infection of the foot. He doesn't states that since October 31 almost 3 months he's had left foot pain swollen left foot and difficulty walking ambulation. He came here today to get x-ray of the foot and sits up can be done about the foot. States he went to do clinically nothing that can do for him and would not x-rays did not have x-ray capability at the clinic. He does not have any insurance.   (Consider location/radiation/quality/duration/timing/severity/associated sxs/prior Treatment) Patient is a 35 y.o. male presenting with lower extremity pain. The history is provided by the patient and a parent. No language interpreter was used.  Foot Pain This is a new problem. Episode onset: 3 months ago. The problem occurs constantly. The problem has not changed since onset.Pertinent negatives include no chest pain, no abdominal pain, no headaches and no shortness of breath. The symptoms are aggravated by walking, standing and exertion. Nothing relieves the symptoms. He has tried a cold compress and rest for the symptoms. The treatment provided mild relief.    Past Medical History  Diagnosis Date  . Hypertension   . Hypercholesteremia   . Anxiety   . Depression    History reviewed.  No pertinent past surgical history. Family History  Problem Relation Age of Onset  . Heart attack Mother   . Hypertension Mother   . Hypercholesterolemia Mother   . Heart attack Father   . Hypertension Father   . Hypercholesterolemia Father   . Depression Maternal Uncle   . Suicidality Maternal Uncle   . Depression Cousin   . Suicidality Cousin    Social History  Substance Use Topics  . Smoking status: Former Smoker -- 0.75 packs/day for 16 years    Types: Cigarettes    Quit date: 04/05/2015  . Smokeless tobacco: Never Used  . Alcohol Use: No    Review of Systems  Respiratory: Negative for shortness of breath.   Cardiovascular: Negative for chest pain.  Gastrointestinal: Negative for abdominal pain.  Neurological: Negative for headaches.  All other systems reviewed and are negative.   Allergies  Review of patient's allergies indicates no known allergies.  Home Medications   Prior to Admission medications   Medication Sig Start Date End Date Taking? Authorizing Provider  atorvastatin (LIPITOR) 80 MG tablet Take 80 mg by mouth daily.   Yes Historical Provider, MD  citalopram (CELEXA) 40 MG tablet Take 1 tablet (40 mg total) by mouth daily. 05/05/15  Yes Kerin Salen, MD  diazepam (VALIUM) 5 MG tablet Take 1 tablet (5 mg total) by mouth every 6 (six) hours as needed for anxiety. 05/05/15  Yes Kerin Salen, MD  QUEtiapine (SEROQUEL) 400 MG tablet Take 1 tablet (400 mg total) by mouth at bedtime. 05/05/15  Yes Kerin Salen, MD  traZODone (DESYREL) 100 MG tablet Take 2 tablets (200 mg total) by mouth at bedtime as needed for sleep. 05/05/15  Yes Kerin Salen, MD   Meds Ordered and Administered this Visit  Medications - No data to display  BP 140/75 mmHg  Pulse 119  Temp(Src) 97.6 F (36.4 C) (Tympanic)  Resp 18  Ht 6' (1.829 m)  Wt 350 lb (158.759 kg)  BMI 47.46 kg/m2  SpO2 96% No data found.   Physical Exam  Constitutional: He is oriented to  person, place, and time. He appears well-developed and well-nourished.  HENT:  Head: Normocephalic and atraumatic.  Eyes: Conjunctivae are normal. Pupils are equal, round, and reactive to light.  Musculoskeletal: He exhibits tenderness.       Feet:  He has diffuse tenderness over the whole left foot and including the bilateral left ankle. Good range of motion ankle appears to be stable.  Neurological: He is alert and oriented to person, place, and time.  Skin: Skin is warm.  Psychiatric: He has a normal mood and affect. His behavior is normal.  Vitals reviewed.   ED Course  Procedures (including critical care time)  Labs Review Labs Reviewed - No data to display  Imaging Review Dg Ankle Complete Left  06/19/2015  CLINICAL DATA:  Pain following fall during seizure EXAM: LEFT ANKLE COMPLETE - 3+ VIEW COMPARISON:  None. FINDINGS: Frontal, oblique, and lateral views were obtained. There is soft tissue swelling. There is a fracture of the proximal aspect of the medial malleolus with mild displacement of fracture fragments. There is also a fracture of the posterior tibial metaphysis with mild displacement of fracture fragments. No other fractures are evident. The ankle mortise is grossly intact on this study. There is mild spurring along the dorsal mid to distal talus. IMPRESSION: Displaced fractures of the proximal medial malleolus and posterior distal tibial metaphysis. Ankle mortise grossly intact. Underlying osteoarthritic change. Generalized soft tissue swelling. These results will be called to the ordering clinician or representative by the Radiologist Assistant, and communication documented in the PACS or zVision Dashboard. Electronically Signed   By: Bretta Bang III M.D.   On: 06/19/2015 10:17   Dg Foot Complete Left  06/19/2015  CLINICAL DATA:  Pain following fall during seizure EXAM: LEFT FOOT - COMPLETE 3+ VIEW COMPARISON:  None. FINDINGS: Frontal, oblique, and lateral views were  obtained. There is evidence of a slightly displaced fracture of the proximal portion of the medial malleolus. There is also a fracture of the posterior aspect of the distal tibia with mild displacement of fracture fragments. In the foot region, no fracture or dislocation is evident. Joint spaces appear intact. No erosive change. IMPRESSION: Fractures of the proximal aspect of the medial malleolus and the posterior distal tibial metaphysis with mild displacement of fracture fragments. No fracture or dislocation in the foot region. No appreciable arthropathic change. These results will be called to the ordering clinician or representative by the Radiologist Assistant, and communication documented in the PACS or zVision Dashboard. Electronically Signed   By: Bretta Bang III M.D.   On: 06/19/2015 10:15     Visual Acuity Review  Right Eye Distance:   Left Eye Distance:   Bilateral Distance:    Right Eye Near:   Left Eye Near:    Bilateral Near:  MDM   1. Bimalleolar fracture of left ankle, closed, initial encounter    We'll plan to x-ray the left foot. Patient had questions about the left foot and ankle and I suggested that he may need see podiatrist. Will proceed with x-ray probably place anti-inflammatory Mobic 15 mg offer him a boot but he states he has access to a boot at home states that when he was a boot for it was painful initially. I'm not sure is because of swelling they had involved in the foot by did want to make sure that it did fit and he states they did fit his foot. Offer him podiatry referral but warned him that podiatry may request some money upfront before they see him.  Patient with bimalleolar fracture. Explained to him that he will need to see an orthopedic mild displacement and age of this fracture he would definitely need to see an orthopedic may have to have surgery done on this foot and ankle. Since does not have insurance am recommending that he goes to [facility]  orthopedic clinic. Follows asked me other questions far as other people who they could see and the workup payment arrangement is fine. He also requests pain medication explained to him since this happened in October in of October he is on Valium cervical trazodone and another medication for his nerves I do not feel that I need to give him narcotic at this time for an old injury that can cause more spot compression. I'm going to hold off on giving any narcotics until he sees orthopedic palate is orthopedic make decision about pain medication or the [facility] clinic. I did strongly suggest that he wear boot 24 7 until the orthopedic sees him and evaluate him.      Note: This dictation was prepared with Dragon dictation along with smaller phrase technology. Any transcriptional errors that result from this process are unintentional.  Hassan Rowan, MD 06/19/15 1046

## 2015-06-19 NOTE — Discharge Instructions (Signed)
Ankle Fracture A fracture is a break in a bone. A cast or splint may be used to protect the ankle and heal the break. Sometimes, surgery is needed. HOME CARE  Use crutches as told by your doctor. It is very important that you use your crutches correctly.  Do not put weight or pressure on the injured ankle until told by your doctor.  Keep your ankle raised (elevated) when sitting or lying down.  Apply ice to the ankle:  Put ice in a plastic bag.  Place a towel between your cast and the bag.  Leave the ice on for 20 minutes, 2-3 times a day.  If you have a plaster or fiberglass cast:  Do not try to scratch under the cast with any objects.  Check the skin around the cast every day. You may put lotion on red or sore areas.  Keep your cast dry and clean.  If you have a plaster splint:  Wear the splint as told by your doctor.  You can loosen the elastic around the splint if your toes get numb, tingle, or turn cold or blue.  Do not put pressure on any part of your cast or splint. It may break. Rest your plaster splint or cast only on a pillow the first 24 hours until it is fully hardened.  Cover your cast or splint with a plastic bag during showers.  Do not lower your cast or splint into water.  Take medicine as told by your doctor.  Do not drive until your doctor says it is safe.  Follow-up with your doctor as told. It is very important that you go to your follow-up visits. GET HELP IF: The swelling and discomfort gets worse.  GET HELP RIGHT AWAY IF:   Your splint or cast breaks.  You continue to have very bad pain.  You have new pain or swelling after your splint or cast was put on.  Your skin or toes below the injured ankle:  Turn blue or gray.  Feel cold, numb, or you cannot feel them.  There is a bad smell or yellowish white fluid (pus) coming from under the splint or cast. MAKE SURE YOU:   Understand these instructions.  Will watch your  condition.  Will get help right away if you are not doing well or get worse.   This information is not intended to replace advice given to you by your health care provider. Make sure you discuss any questions you have with your health care provider.   Document Released: 03/06/2009 Document Revised: 02/27/2013 Document Reviewed: 12/06/2012 Elsevier Interactive Patient Education 2016 [employer]  Displaced Bimalleolar Ankle Fracture Treated With ORIF, Care After Refer to this sheet in the next few weeks. These instructions provide you with information about caring for yourself after your procedure. Your health care provider may also give you more specific instructions. Your treatment has been planned according to current medical practices, but problems sometimes occur. Call your health care provider if you have any problems or questions after your procedure. WHAT TO EXPECT AFTER THE PROCEDURE: After your procedure, it is common to have:   Pain.  Swelling. HOME CARE INSTRUCTIONS If You Have a Cast:  Do not stick anything inside the cast to scratch your skin. Doing that increases your risk of infection.  Check the skin around the cast every day. Report any concerns to your health care provider. You may put lotion on dry skin around the edges of the cast. Do  not apply lotion to the skin underneath the cast.  Do not put pressure on any part of the cast until it is fully hardened. This may take several hours.  Keep the cast clean and dry. If You Have a Splint or a Boot:  Wear it as directed by your health care provider. Remove it only as directed by your health care provider.  Loosen the splint or boot if your toes become numb and tingle, or if they turn cold and blue.  Do not pressure on any part of the splint or boot until it is fully hardened. This may take several hours.  Keep the splint or boot clean and dry. Bathing  Do not take baths, swim, or use a hot tub until your  health care provider approves. Ask your health care provider if you can take showers. You may only be allowed to take sponge baths for bathing.  If your health care provider approves bathing and showering, cover the cast or splint with a watertight plastic bag to protect it from water. Do not let the cast or splint get wet.  Keep the bandage (dressing) dry until your health care provider says it can be removed. Incision Care  There are many ways to close and cover an incision. For example, an incision can be closed with stitches (sutures), skin glue, or adhesive strips. Follow instructions from your health care provider about:  How to take care of your incision.  When and how you should change your bandage (dressing).  When you should remove your dressing.  Removing whatever was used to close your incision.  Check your incision area every day for signs of infection. Watch for:  Redness, swelling, or pain.  Fluid, blood, or pus. Managing Pain, Stiffness, and Swelling   Move your toes often to avoid stiffness and to lessen swelling.  Raise (elevate) the injured area above the level of your heart while you are sitting or lying down.  If directed, apply ice to the injured area (if you have a splint or a boot, not a cast).  Put ice in a plastic bag.  Place a towel between your skin and the bag.  Leave the ice on for 20 minutes, 2-3 times per day. Driving  Do not drive or operate heavy machinery while taking pain medicine.  Do not drive while wearing a cast, splint, or boot on a foot that you use for driving. Activity  Return to your normal activities as directed by your health care provider. Ask your health care provider what activities are safe for you.  Perform exercises daily as directed by your health care provider or physical therapist. Safety  Do not walk on the injured ankle and do not use it to support your body weight until your health care provider says that you  can. Follow these instructions exactly to prevent problems. Use crutches or other supportive devices as directed by your health care provider. General Instructions  Do not use any tobacco products, including cigarettes, chewing tobacco, or e-cigarettes. Tobacco can delay bone healing. If you need help quitting, ask your health care provider.  Take medicines only as directed by your health care provider.  Keep all follow-up visits as directed by your health care provider. This is important. SEEK MEDICAL CARE IF:  You have a fever.  Your pain medicine is not helping.  You have redness, swelling, or pain at the site of your incision.  You have fluid, blood, or pus coming from your  incision or seeping through your cast.  You notice a bad smell coming from your incision or your dressing. SEEK IMMEDIATE MEDICAL CARE IF:  You have chest pain.  You have difficulty breathing.  You have numbness or tingling in your foot or leg.  Your foot becomes cold, pale, or blue.   This information is not intended to replace advice given to you by your health care provider. Make sure you discuss any questions you have with your health care provider.   Document Released: 11/26/2004 Document Revised: 09/23/2014 Document Reviewed: 04/02/2014 Elsevier Interactive Patient Education [employer]

## 2015-06-19 NOTE — ED Provider Notes (Addendum)
[facility] Emergency Department Provider Note  ____________________________________________   I have reviewed the triage vital signs and the nursing notes.   HISTORY  Chief Complaint Drug Overdose    HPI Ian [name] is a 35 y.o. male presents today unresponsive. I cannot get a history from him. All history is from the EMS. The family are not at bedside. Patient was seen in October for altered mental status was found to have overdosed on his benzodiazepines. Patient has a history of benzodiazepine use apparently. Patient also was recently given pain medications according to EMS for a foot injury. According to EMS, he was found by family while getting ready for dinner,  nonresponsive. EMS was called. They gave him 4 mg of Narcan and tried a nasal trumpet. Patient had respirations in the single digits. Vital signs are reassuring. After giving the patient 4 Narcan, per ems, his respirations seem to increase somewhat but he still remained largely unresponsive. His pupils were very small but reactive. No evidence of trauma was found and there was no reported trauma.  Past Medical History  Diagnosis Date  . Hypertension   . Hypercholesteremia   . Anxiety   . Depression     Patient Active Problem List   Diagnosis Date Noted  . Benzodiazepine abuse, episodic 03/24/2015  . Sedative intoxication (HCC) 03/24/2015  . Generalized anxiety disorder 03/24/2015  . Panic disorder 03/24/2015  . Alcohol abuse, in remission 03/24/2015  . Altered mental status 03/23/2015  . 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 Depression, major, recurrent, in complete remission (HCC) 02/09/2015  . Depression, major, recurrent, moderate (HCC) 02/09/2015    No past surgical history on file.  Current Outpatient Rx  Name  Route  Sig  Dispense  Refill  . atorvastatin (LIPITOR) 80 MG tablet   Oral   Take 80 mg by mouth daily.         . citalopram (CELEXA) 40 MG tablet   Oral    Take 1 tablet (40 mg total) by mouth daily.   30 tablet   5   . diazepam (VALIUM) 5 MG tablet   Oral   Take 1 tablet (5 mg total) by mouth every 6 (six) hours as needed for anxiety.   120 tablet   4     FILL ON June 08, 2015   . meloxicam (MOBIC) 15 MG tablet   Oral   Take 1 tablet (15 mg total) by mouth daily.   30 tablet   1   . QUEtiapine (SEROQUEL) 400 MG tablet   Oral   Take 1 tablet (400 mg total) by mouth at bedtime.   30 tablet   5   . traZODone (DESYREL) 100 MG tablet   Oral   Take 2 tablets (200 mg total) by mouth at bedtime as needed for sleep.   60 tablet   5     Allergies Review of patient's allergies indicates no known allergies.  Family History  Problem Relation Age of Onset  . Heart attack Mother   . Hypertension Mother   . Hypercholesterolemia Mother   . Heart attack Father   . Hypertension Father   . Hypercholesterolemia Father   . Depression Maternal Uncle   . Suicidality Maternal Uncle   . Depression Cousin   . Suicidality Cousin     Social History Social History  Substance Use Topics  . Smoking status: Former Smoker -- 0.75 packs/day for 16 years    Types: Cigarettes  Quit date: 04/05/2015  . Smokeless tobacco: Never Used  . Alcohol Use: No    Review of Systems Cannot review because the patient altered mental status  ____________________________________________   PHYSICAL EXAM:  VITAL SIGNS: ED Triage Vitals  Enc Vitals Group     BP 06/19/15 2110 114/100 mmHg     Pulse Rate 06/19/15 2054 93     Resp 06/19/15 2054 9     Temp 06/19/15 2057 96.4 F (35.8 C)     Temp Source 06/19/15 2057 Axillary     SpO2 --      Weight 06/19/15 2057 349 lb (158.305 kg)     Height 06/19/15 2054  (1.753 m)     Head Cir --      Peak Flow --      Pain Score --      Pain Loc --      Pain Edu? --      Excl. in GC? --     Constitutional: Patient localizes to painful stimuli otherwise nonresponsive snoring respirations  noted, Eyes: Conjunctivae are normal. Pupils are pinpoint but responsive EOMI. Head: Atraumatic. Nose: No congestion/rhinnorhea. Mouth/Throat: Mucous membranes are moist.  Oropharynx non-erythematous. Neck: No evidence of injury or tenderness to the extent they can be determined Cardiovascular: Normal rate, regular rhythm. Grossly normal heart sounds.  Good peripheral circulation. Respiratory: Lungs are clear with sonorous respirations likely referred upper airway, decreased respiratory rate noted. Abdominal: Morbidly obese, soft, normal bowel sounds no evidence of abdominal discomfort but again limited exam Back:  Deferred G: Normal external male genitalia Musculoskeletal: No lower extremity tenderness. No joint effusions, no DVT signs strong distal pulses no edema Neurologic:  Patient is nonresponsive except for to sternal rub which does have him localizes to pain and try to pull off his leads. Skin:  Skin is warm, dry and intact. No rash noted.  ____________________________________________   LABS (all labs ordered are listed, but only abnormal results are displayed)  Labs Reviewed  CBC - Abnormal; Notable for the following:    WBC 12.1 (*)    RDW 14.8 (*)    All other components within normal limits  URINALYSIS COMPLETEWITH MICROSCOPIC ([facility] ONLY) - Abnormal; Notable for the following:    Color, Urine YELLOW (*)    APPearance CLEAR (*)    All other components within normal limits  GLUCOSE, CAPILLARY - Abnormal; Notable for the following:    Glucose-Capillary 106 (*)    All other components within normal limits  TROPONIN I  COMPREHENSIVE METABOLIC PANEL  ETHANOL  SALICYLATE LEVEL  ACETAMINOPHEN LEVEL  URINE DRUG SCREEN, QUALITATIVE ([facility] ONLY)  CBG MONITORING, ED   ____________________________________________  EKG  I personally interpreted any EKGs ordered by me or triage Normal sinus rhythm rate 91 bpm no acute ST elevation or depression normal axis, nonspecific ST  changes, baseline artifact limits interpretation but no acute ischemia noted ____________________________________________  RADIOLOGY  I reviewed any imaging ordered by me or triage that were performed during my shift ____________________________________________   PROCEDURES  Procedure(s) performed:  Intubation: Pretreated with etomidate and succinylcholine, preoxygenated,  with one pass was able to pass with direct visualization a 7.5 tube to 24 at the lips. No complications patient tolerated the procedure with no noted desaturation or other adverse event. I did use the glidescope  Critical Care performed: CRITICAL CARE Performed by: Jeanmarie Plant   Total critical care time: 60 minutes  Critical care time was exclusive of separately billable procedures and  treating other patients.  Critical care was necessary to treat or prevent imminent or life-threatening deterioration.  Critical care was time spent personally by me on the following activities: development of treatment plan with patient and/or surrogate as well as nursing, discussions with consultants, evaluation of patient's response to treatment, examination of patient, obtaining history from patient or surrogate, ordering and performing treatments and interventions, ordering and review of laboratory studies, ordering and review of radiographic studies, pulse oximetry and re-evaluation of patient's condition.   ____________________________________________   INITIAL IMPRESSION / ASSESSMENT AND PLAN / ED COURSE  Pertinent labs & imaging results that were available during my care of the patient were reviewed by me and considered in my medical decision making (see chart for details).  Patient was given 6 more milligrams of Narcan here with no change of any significance. He remained nonresponsive aside from sternal rub. It was not safe to leave him like that so I did intubate him. Intubation went without any complications on one  pass. He did not desat oxygen saturation was 99 or above the entire time. Patient was apparent like somewhat hypoxic on arrival for EMS. This is thought to be a witnessed event. Patient does have a history of overdose. This is likely multifactorial overdose. To be on the safe time we are obtaining a CT scan of his head but there is no history of trauma. We will check blood work, and keep the patient intubated until we can figure out what happened. It is likely, if this is from benzodiazepines, that time will resolve the situation unless he had anoxic braine injury. No evidence of acute cardiopulmonary pathology noted initially. Seemed to move all fours to sternal rub. Other forms of encephalopathy considered.  ____________________________________________   FINAL CLINICAL IMPRESSION(S) / ED DIAGNOSES  Final diagnoses:  Unresponsive     Jeanmarie Plant, MD 06/19/15 2155  Jeanmarie Plant, MD 06/19/15 1610  Jeanmarie Plant, MD 06/20/15 [phone]

## 2015-06-19 NOTE — ED Notes (Signed)
X-ray at bedside

## 2015-06-19 NOTE — H&P (Addendum)
[facility] [place] at [facility]   PATIENT NAME: Ian [name]    MR#:  [id number]  DATE OF BIRTH:  1980-12-13   DATE OF ADMISSION:  06/19/2015  PRIMARY CARE PHYSICIAN: No primary care provider on file.   REQUESTING/REFERRING PHYSICIAN: McShane  CHIEF COMPLAINT:   Chief Complaint  Patient presents with  . Drug Overdose    HISTORY OF PRESENT ILLNESS:  Riely Baskett  is a 35 y.o. male with a known history of benzodiazepine abuse presenting in unresponsive state. Apparently the patient was seen at [facility] urgent care earlier today diagnosed with ankle fracture and subsequent discharge. Later in the day he is noted to be unresponsive at home. With respirations of 3 received doses of Narcan without response subsequently intubated.  PAST MEDICAL HISTORY:   Past Medical History  Diagnosis Date  . Hypertension   . Hypercholesteremia   . Anxiety   . Depression     PAST SURGICAL HISTORY:  History reviewed. No pertinent past surgical history.  SOCIAL HISTORY:   Social History  Substance Use Topics  . Smoking status: Former Smoker -- 0.75 packs/day for 16 years    Types: Cigarettes    Quit date: 04/05/2015  . Smokeless tobacco: Never Used  . Alcohol Use: No    FAMILY HISTORY:   Family History  Problem Relation Age of Onset  . Heart attack Mother   . Hypertension Mother   . Hypercholesterolemia Mother   . Heart attack Father   . Hypertension Father   . Hypercholesterolemia Father   . Depression Maternal Uncle   . Suicidality Maternal Uncle   . Depression Cousin   . Suicidality Cousin     DRUG ALLERGIES:  No Known Allergies  REVIEW OF SYSTEMS:  Unable to obtain given patient's mental status/medical condition   MEDICATIONS AT HOME:   Prior to Admission medications   Medication Sig Start Date End Date Taking? Authorizing Provider  atorvastatin (LIPITOR) 80 MG tablet Take 80 mg by mouth daily.   Yes Historical Provider, MD   citalopram (CELEXA) 40 MG tablet Take 1 tablet (40 mg total) by mouth daily. 05/05/15  Yes Kerin Salen, MD  diazepam (VALIUM) 5 MG tablet Take 1 tablet (5 mg total) by mouth every 6 (six) hours as needed for anxiety. 05/05/15  Yes Kerin Salen, MD  meloxicam (MOBIC) 15 MG tablet Take 1 tablet (15 mg total) by mouth daily. 06/19/15  Yes Hassan Rowan, MD  QUEtiapine (SEROQUEL) 400 MG tablet Take 1 tablet (400 mg total) by mouth at bedtime. 05/05/15  Yes Kerin Salen, MD  traZODone (DESYREL) 100 MG tablet Take 2 tablets (200 mg total) by mouth at bedtime as needed for sleep. 05/05/15  Yes Kerin Salen, MD      VITAL SIGNS:  Blood pressure 102/65, pulse 78, temperature 97.4 F (36.3 C), temperature source Oral, resp. rate 18, height  (1.753 m), weight 349 lb (158.305 kg), SpO2 98 %.  PHYSICAL EXAMINATION:   VITAL SIGNS: Filed Vitals:   06/19/15 2300 06/19/15 2315  BP: 106/63 126/89  Pulse: 77 95  Temp:    Resp: 18 23   GENERAL:35 y.o.male critically ill currently intubated/sedated HEAD: Normocephalic, atraumatic.  EYES: Pupils equal, round, sluggishly reactive to light. Unable to assess extraocular muscles given mental status/medical condition. No scleral icterus.  MOUTH: Moist mucosal membrane. Dentition intact. No abscess noted.  EAR, NOSE, THROAT: Clear without exudates. No external lesions.  NECK: Supple. No thyromegaly.  No nodules. No JVD.  PULMONARY: Clear to ascultation, without wheeze rails or rhonci. No use of accessory muscles, Good respiratory effort. good air entry bilaterally CHEST: Nontender to palpation.  CARDIOVASCULAR: S1 and S2. Regular rate and rhythm. No murmurs, rubs, or gallops. No edema. Pedal pulses 2+ bilaterally.  GASTROINTESTINAL: Soft, nontender, nondistended. No masses. Positive bowel sounds. No hepatosplenomegaly.  MUSCULOSKELETAL: No swelling, clubbing, or edema. Range of motion full in all extremities.  NEUROLOGIC: Unable to assess  given mental status/medical condition SKIN: No ulceration, lesions, rashes, or cyanosis. Skin warm and dry. Turgor intact.  PSYCHIATRIC: Unable to assess given mental status/medical condition      LABORATORY PANEL:   CBC  Recent Labs Lab 06/19/15 2103  WBC 12.1*  HGB 15.0  HCT 45.4  PLT 280   ------------------------------------------------------------------------------------------------------------------  Chemistries   Recent Labs Lab 06/19/15 2103  NA 139  K 5.8*  CL 101  CO2 26  GLUCOSE 123*  BUN 14  CREATININE 1.57*  CALCIUM 10.3  AST 35  ALT 49  ALKPHOS 116  BILITOT 0.6   ------------------------------------------------------------------------------------------------------------------  Cardiac Enzymes  Recent Labs Lab 06/19/15 2103  TROPONINI <0.03   ------------------------------------------------------------------------------------------------------------------  RADIOLOGY:  Dg Ankle Complete Left  06/19/2015  CLINICAL DATA:  Pain following fall during seizure EXAM: LEFT ANKLE COMPLETE - 3+ VIEW COMPARISON:  None. FINDINGS: Frontal, oblique, and lateral views were obtained. There is soft tissue swelling. There is a fracture of the proximal aspect of the medial malleolus with mild displacement of fracture fragments. There is also a fracture of the posterior tibial metaphysis with mild displacement of fracture fragments. No other fractures are evident. The ankle mortise is grossly intact on this study. There is mild spurring along the dorsal mid to distal talus. IMPRESSION: Displaced fractures of the proximal medial malleolus and posterior distal tibial metaphysis. Ankle mortise grossly intact. Underlying osteoarthritic change. Generalized soft tissue swelling. These results will be called to the ordering clinician or representative by the Radiologist Assistant, and communication documented in the PACS or zVision Dashboard. Electronically Signed   By: Bretta Bang III M.D.   On: 06/19/2015 10:17   Ct Head Wo Contrast  06/19/2015  CLINICAL DATA:  Patient was found unresponsive. Improved with Narcan. Patient has been taking narcotics for leg fracture. EXAM: CT HEAD WITHOUT CONTRAST TECHNIQUE: Contiguous axial images were obtained from the base of the skull through the vertex without intravenous contrast. COMPARISON:  MRI brain 03/23/2015.  CT head 03/23/2015. FINDINGS: Patient is intubated. Ventricles and sulci appear symmetrical. No ventricular dilatation. No mass effect or midline shift. No abnormal extra-axial fluid collections. Gray-white matter junctions are distinct. Basal cisterns are not effaced. No evidence of acute intracranial hemorrhage. No depressed skull fractures. Visualized paranasal sinuses and mastoid air cells are not opacified. IMPRESSION: No acute intracranial abnormalities. Electronically Signed   By: Burman Nieves M.D.   On: 06/19/2015 21:47   Dg Chest Port 1 View  06/19/2015  CLINICAL DATA:  Initial evaluation for unresponsiveness, status post intubation. EXAM: PORTABLE CHEST 1 VIEW COMPARISON:  None. FINDINGS: Endotracheal tube in place with tip approximately 2.7 cm 1.5 cm above the carina. Accentuation of the cardiac silhouette related AP technique, shallow lung inflation, and lordotic angulation. This also likely accounts for apparent mediastinal widening. Lungs are hypoinflated. Perihilar vascular congestion with bronchovascular crowding. There is dense opacity within the retrocardiac left lower lobe, which may reflect atelectasis or possibly infiltrate. Aspiration could be considered. More mild patchy opacity at the right lung  base. No pneumothorax. No definite pleural effusion. No acute osseous abnormality. IMPRESSION: 1. Tip of endotracheal tube approximately 1.5 cm above the carina. 2. Shallow lung inflation with secondary pulmonary vascular congestion and bronchovascular crowding. Left greater than right bibasilar opacities  favored to reflect atelectasis/bronchovascular crowding. Possible aspiration or infectious pneumonitis could also be considered, particularly at the retrocardiac left lower lobe. Electronically Signed   By: Rise Mu M.D.   On: 06/19/2015 21:33   Dg Foot Complete Left  06/19/2015  CLINICAL DATA:  Pain following fall during seizure EXAM: LEFT FOOT - COMPLETE 3+ VIEW COMPARISON:  None. FINDINGS: Frontal, oblique, and lateral views were obtained. There is evidence of a slightly displaced fracture of the proximal portion of the medial malleolus. There is also a fracture of the posterior aspect of the distal tibia with mild displacement of fracture fragments. In the foot region, no fracture or dislocation is evident. Joint spaces appear intact. No erosive change. IMPRESSION: Fractures of the proximal aspect of the medial malleolus and the posterior distal tibial metaphysis with mild displacement of fracture fragments. No fracture or dislocation in the foot region. No appreciable arthropathic change. These results will be called to the ordering clinician or representative by the Radiologist Assistant, and communication documented in the PACS or zVision Dashboard. Electronically Signed   By: Bretta Bang III M.D.   On: 06/19/2015 10:15    EKG:   Orders placed or performed during the hospital encounter of 06/19/15  . EKG 12-Lead  . EKG 12-Lead  . ED EKG  . ED EKG    IMPRESSION AND PLAN:   35 year old Caucasian gentleman history of benzodiazepine abuse presenting in unresponsive state  1. Acute respiratory failure, hypercapnic: Continue full vent support, wean FiO2 and PEEP as tolerated. He was already started on propofol in emergency department will continue this for now, reassess ABG 2. Benzodiazepine overdose: As above 3. Hyperkalemia: IV fluid hydration and follow potassium level IV. Hyperlipidemia unspecified statin therapy 5. Venous thrombosis prophylactic: Heparin  subcutaneous 6.Fractures of the proximal aspect of the medial malleolus and the posterior distal tibial metaphysis with mild displacement of fracture fragments.: Will require orthopedic follow-up when extubated   All the records are reviewed and case discussed with ED provider. Management plans discussed with the patient, family and they are in agreement.  CODE STATUS: Full  TOTAL TIME TAKING CARE OF THIS PATIENT: Critical care 40 minutes.    Palmer Shorey,  Mardi Mainland.D on 06/19/2015 at 11:20 PM  Between 7am to 6pm - Pager - [phone]  After 6pm: House Pager: - [phone]  [facility]  Office  [phone]  CC: Primary care physician; No primary care provider on file.

## 2015-06-19 NOTE — ED Notes (Signed)
MD at bedside. 

## 2015-06-20 ENCOUNTER — Inpatient Hospital Stay: Payer: Self-pay

## 2015-06-20 DIAGNOSIS — T424X1A Poisoning by benzodiazepines, accidental (unintentional), initial encounter: Principal | ICD-10-CM

## 2015-06-20 DIAGNOSIS — T424X4S Poisoning by benzodiazepines, undetermined, sequela: Secondary | ICD-10-CM

## 2015-06-20 DIAGNOSIS — J9600 Acute respiratory failure, unspecified whether with hypoxia or hypercapnia: Secondary | ICD-10-CM

## 2015-06-20 DIAGNOSIS — G9340 Encephalopathy, unspecified: Secondary | ICD-10-CM

## 2015-06-20 LAB — BLOOD GAS, ARTERIAL
ALLENS TEST (PASS/FAIL): POSITIVE — AB
Acid-base deficit: 0.3 mmol/L (ref 0.0–2.0)
Bicarbonate: 27.7 mEq/L (ref 21.0–28.0)
FIO2: 0.5
LHR: 18 {breaths}/min
MECHANICAL RATE: 18
O2 Saturation: 98.5 %
PATIENT TEMPERATURE: 37
PCO2 ART: 59 mmHg — AB (ref 32.0–48.0)
PEEP: 5 cmH2O
VT: 500 mL
pH, Arterial: 7.28 — ABNORMAL LOW (ref 7.350–7.450)
pO2, Arterial: 128 mmHg — ABNORMAL HIGH (ref 83.0–108.0)

## 2015-06-20 LAB — BASIC METABOLIC PANEL
ANION GAP: 10 (ref 5–15)
ANION GAP: 9 (ref 5–15)
BUN: 14 mg/dL (ref 6–20)
BUN: 12 mg/dL (ref 6–20)
CHLORIDE: 103 mmol/L (ref 101–111)
CHLORIDE: 107 mmol/L (ref 101–111)
CO2: 26 mmol/L (ref 22–32)
CO2: 26 mmol/L (ref 22–32)
Calcium: 9.6 mg/dL (ref 8.9–10.3)
Calcium: 9.7 mg/dL (ref 8.9–10.3)
Creatinine, Ser: 1.41 mg/dL — ABNORMAL HIGH (ref 0.61–1.24)
Creatinine, Ser: 1.09 mg/dL (ref 0.61–1.24)
GFR calc non Af Amer: >60 mL/min (ref >60)
GFR calc non Af Amer: >60 mL/min (ref >60)
GLUCOSE: 96 mg/dL (ref 65–99)
Glucose, Bld: 123 mg/dL — ABNORMAL HIGH (ref 65–99)
POTASSIUM: 4.1 mmol/L (ref 3.5–5.1)
Potassium: 6 mmol/L — ABNORMAL HIGH (ref 3.5–5.1)
Sodium: 139 mmol/L (ref 135–145)
Sodium: 142 mmol/L (ref 135–145)

## 2015-06-20 LAB — CBC
HEMATOCRIT: 43.1 % (ref 40.0–52.0)
HEMOGLOBIN: 14.5 g/dL (ref 13.0–18.0)
MCH: 32 pg (ref 26.0–34.0)
MCHC: 33.8 g/dL (ref 32.0–36.0)
MCV: 94.7 fL (ref 80.0–100.0)
Platelets: 268 10*3/uL (ref 150–440)
RBC: 4.55 MIL/uL (ref 4.40–5.90)
RDW: 14.7 % — ABNORMAL HIGH (ref 11.5–14.5)
WBC: 10.1 10*3/uL (ref 3.8–10.6)

## 2015-06-20 LAB — GLUCOSE, CAPILLARY
Glucose-Capillary: 135 mg/dL — ABNORMAL HIGH (ref 65–99)
Glucose-Capillary: 99 mg/dL (ref 65–99)

## 2015-06-20 LAB — TRIGLYCERIDES: Triglycerides: 271 mg/dL — ABNORMAL HIGH (ref <150)

## 2015-06-20 LAB — MRSA PCR SCREENING: MRSA by PCR: NEGATIVE

## 2015-06-20 MED ORDER — ANTISEPTIC ORAL RINSE SOLUTION (CORINZ)
7.0000 mL | Freq: Four times a day (QID) | OROMUCOSAL | Status: DC
  Administered 2015-06-20 (×3): 7 mL via OROMUCOSAL
  Filled 2015-06-20 (×9): qty 7

## 2015-06-20 MED ORDER — ATORVASTATIN CALCIUM 20 MG PO TABS
80.0000 mg | ORAL_TABLET | Freq: Every day | ORAL | Status: DC
  Administered 2015-06-20: 80 mg via ORAL
  Filled 2015-06-20 (×2): qty 4

## 2015-06-20 MED ORDER — DEXTROSE 50 % IV SOLN
1.0000 | Freq: Once | INTRAVENOUS | Status: AC
  Administered 2015-06-20: 50 mL via INTRAVENOUS
  Filled 2015-06-20: qty 50

## 2015-06-20 MED ORDER — FAMOTIDINE IN NACL 20-0.9 MG/50ML-% IV SOLN
20.0000 mg | Freq: Two times a day (BID) | INTRAVENOUS | Status: DC
  Administered 2015-06-20 (×3): 20 mg via INTRAVENOUS
  Filled 2015-06-20 (×5): qty 50

## 2015-06-20 MED ORDER — DIAZEPAM 5 MG PO TABS
5.0000 mg | ORAL_TABLET | Freq: Four times a day (QID) | ORAL | Status: DC | PRN
  Administered 2015-06-20 – 2015-06-21 (×3): 5 mg via ORAL
  Filled 2015-06-20 (×3): qty 1

## 2015-06-20 MED ORDER — SODIUM POLYSTYRENE SULFONATE 15 GM/60ML PO SUSP
30.0000 g | Freq: Once | ORAL | Status: AC
  Administered 2015-06-20: 30 g
  Filled 2015-06-20: qty 120

## 2015-06-20 MED ORDER — PROPOFOL 1000 MG/100ML IV EMUL
5.0000 ug/kg/min | INTRAVENOUS | Status: DC
  Administered 2015-06-20: 40 ug/kg/min via INTRAVENOUS
  Administered 2015-06-20: 20.004 ug/kg/min via INTRAVENOUS
  Filled 2015-06-20 (×2): qty 100

## 2015-06-20 MED ORDER — CHLORHEXIDINE GLUCONATE 0.12% ORAL RINSE (MEDLINE KIT)
15.0000 mL | Freq: Two times a day (BID) | OROMUCOSAL | Status: DC
Start: 2015-06-20 — End: 2015-06-21
  Administered 2015-06-20 (×2): 15 mL via OROMUCOSAL
  Filled 2015-06-20 (×6): qty 15

## 2015-06-20 MED ORDER — QUETIAPINE FUMARATE 400 MG PO TABS
400.0000 mg | ORAL_TABLET | Freq: Every day | ORAL | Status: DC
  Administered 2015-06-20: 400 mg via ORAL
  Filled 2015-06-20: qty 1
  Filled 2015-06-20: qty 4

## 2015-06-20 MED ORDER — INSULIN ASPART 100 UNIT/ML IV SOLN
10.0000 [IU] | Freq: Once | INTRAVENOUS | Status: AC
  Administered 2015-06-20: 10 [IU] via INTRAVENOUS
  Filled 2015-06-20: qty 0.1

## 2015-06-20 MED ORDER — MELOXICAM 7.5 MG PO TABS
15.0000 mg | ORAL_TABLET | Freq: Every day | ORAL | Status: DC
  Administered 2015-06-20: 15 mg via ORAL
  Filled 2015-06-20 (×2): qty 2

## 2015-06-20 MED ORDER — SODIUM CHLORIDE 0.9 % IV SOLN
1.0000 g | Freq: Once | INTRAVENOUS | Status: AC
  Administered 2015-06-20: 1 g via INTRAVENOUS
  Filled 2015-06-20: qty 10

## 2015-06-20 MED ORDER — CITALOPRAM HYDROBROMIDE 20 MG PO TABS
40.0000 mg | ORAL_TABLET | Freq: Every day | ORAL | Status: DC
  Administered 2015-06-20 – 2015-06-21 (×2): 40 mg via ORAL
  Filled 2015-06-20 (×2): qty 2

## 2015-06-20 NOTE — Progress Notes (Signed)
eLink Physician-Brief Progress Note Patient Name: Ian [name] DOB: 1980/11/14 MRN: [id number]   Date of Service  06/20/2015  HPI/Events of Note  29 M with h/o of HTN/Anxiety/Depression found unresponsive at home.  H/O of benzo use/abuse>>concern for OD.  Did not respond to Narcan>>intubated.  Now HD stable with sats of 100%.  Had resp acidosis on ABG but RR on vent is 18 now.  eICU Interventions  Plan of care per primary team ABG in AM>>on PCCM consult list     Intervention Category Evaluation Type: New Patient Evaluation  Terre Hanneman 06/20/2015, 1:11 AM

## 2015-06-20 NOTE — Progress Notes (Signed)
Pt arrived to room from ER.   Intubated and sedated.  Unable to participate in the admission process.  No family members available.

## 2015-06-20 NOTE — Consult Note (Signed)
[facility] Face-to-Face Psychiatry Consult   Reason for Consult:  Consult for this 35 year old man brought in obtunded with an obvious overdose concern about self injury. Referring Physician:  [place]our[place] Patient Identification: Ian [name] MRN:  [id number] Principal Diagnosis: Acute respiratory failure [facility]) Diagnosis:   Patient Active Problem List   Diagnosis Date Noted  . Acute respiratory failure ([place]) [J96.00] 06/19/2015  . Benzodiazepine overdose [T42.4X1A] 06/19/2015  . 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 Generalized anxiety disorder [F41.1] 03/24/2015  . Panic disorder [F41.0] 03/24/2015  . 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 Depression, major, recurrent, in complete remission ([place]) [F33.42] 02/09/2015  . Depression, major, recurrent, moderate ([place]) [F33.1] 02/09/2015    Total Time spent with patient: 1 hour  Subjective:   Ian [name] is a 35 y.o. male patient admitted with "I'm not suicidal".  HPI:  Patient interviewed. Chart reviewed. Current and old chart reviewed including lab studies and outpatient notes. This 35 year old man with a history of severe anxiety was found unconscious at home by his family. Talked to the hospital by EMS. Narcan not effective. Briefly had to be intubated. On interview with me today the patient starts right out and telling me that he is not suicidal. He is very anxious to try to get this message across. He claims that he had taken 3 or 4 tablets of a pain medicine. He says he cannot remember exactly what it was. He says that he was not prescribed this pain medicine and that he took them because he has been depressed. He denies that he took an excessive number of his Valium. Patient says his mood is been depressed but it is hard to get him to follow-up on that. Every time I try and pursue a line of questioning he gets more panicky. He denies suicidal ideation. Doesn't  report any specific new stresses. Says his sleep is always poor. Denies any psychotic symptoms. Patient is obviously aware that his use of Valium has been called into question several times in the past.  Social history: Lives with his parents. Doesn't work. Doesn't have much social life or activity. Only contact her as parents.  Medical history: Patient is overweight has a history of difficulty with respiration. No other active medical problems however. His dry of multiple overdoses.  Substance abuse history: History of benzodiazepine abuse alcohol abuse in the past. Doesn't have a known past history of narcotic abuse.  Past Psychiatric History: Patient has a long history of anxiety as a primary symptom. Sees Dr. Jimmye Norman outpatient. Patient has had several times he's had to be brought into the hospital similarly because he took too much of his medicine. He is currently being prescribed Seroquel and BuSpar as well as his Valium. Denies that he is ever actually tried to kill himself.  Risk to Self: Is patient at risk for suicide?: Yes Risk to Others:   Prior Inpatient Therapy:   Prior Outpatient Therapy:    Past Medical History:  Past Medical History  Diagnosis Date  . Hypertension   . Hypercholesteremia   . Anxiety   . Depression    History reviewed. No pertinent past surgical history. Family History:  Family History  Problem Relation Age of Onset  . Heart attack Mother   . Hypertension Mother   . Hypercholesterolemia Mother   . Heart attack Father   . Hypertension Father   . Hypercholesterolemia Father   . Depression Maternal Uncle   .  Suicidality Maternal Uncle   . Depression Cousin   . Suicidality Cousin    Family Psychiatric  History: Patient says there is a positive family history for anxiety Social History:  History  Alcohol Use No     History  Drug Use No    Social History   Social History  . Marital Status: Single    Spouse Name: N/A  . Number of Children:  N/A  . Years of Education: N/A   Social History Main Topics  . Smoking status: Former Smoker -- 0.75 packs/day for 16 years    Types: Cigarettes    Quit date: 04/05/2015  . Smokeless tobacco: Never Used  . Alcohol Use: No  . Drug Use: No  . Sexual Activity: No   Other Topics Concern  . None   Social History Narrative   Additional Social History:                          Allergies:  No Known Allergies  Labs:  Results for orders placed or performed during the hospital encounter of 06/19/15 (from the past 48 hour(s))  Troponin I     Status: None   Collection Time: 06/19/15  9:03 PM  Result Value Ref Range   Troponin I <0.03 <0.031 ng/mL    Comment:        NO INDICATION OF MYOCARDIAL INJURY.   Comprehensive metabolic panel     Status: Abnormal   Collection Time: 06/19/15  9:03 PM  Result Value Ref Range   Sodium 139 135 - 145 mmol/L   Potassium 5.8 (H) 3.5 - 5.1 mmol/L   Chloride 101 101 - 111 mmol/L   CO2 26 22 - 32 mmol/L   Glucose, Bld 123 (H) 65 - 99 mg/dL   BUN 14 6 - 20 mg/dL   Creatinine, Ser 1.57 (H) 0.61 - 1.24 mg/dL   Calcium 10.3 8.9 - 10.3 mg/dL   Total Protein 8.8 (H) 6.5 - 8.1 g/dL   Albumin 5.0 3.5 - 5.0 g/dL   AST 35 15 - 41 U/L   ALT 49 17 - 63 U/L   Alkaline Phosphatase 116 38 - 126 U/L   Total Bilirubin 0.6 0.3 - 1.2 mg/dL   GFR calc non Af Amer 56 (L) >60 mL/min   GFR calc Af Amer >60 >60 mL/min    Comment: (NOTE) The eGFR has been calculated using the CKD EPI equation. This calculation has not been validated in all clinical situations. eGFR's persistently <60 mL/min signify possible Chronic Kidney Disease.    Anion gap 12 5 - 15  Ethanol (ETOH)     Status: None   Collection Time: 06/19/15  9:03 PM  Result Value Ref Range   Alcohol, Ethyl (B) <5 <5 mg/dL    Comment:        LOWEST DETECTABLE LIMIT FOR SERUM ALCOHOL IS 5 mg/dL FOR MEDICAL PURPOSES ONLY   Salicylate level     Status: None   Collection Time: 06/19/15  9:03 PM   Result Value Ref Range   Salicylate Lvl <1.5 2.8 - 30.0 mg/dL  Acetaminophen level     Status: Abnormal   Collection Time: 06/19/15  9:03 PM  Result Value Ref Range   Acetaminophen (Tylenol), Serum <10 (L) 10 - 30 ug/mL    Comment:        THERAPEUTIC CONCENTRATIONS VARY SIGNIFICANTLY. A RANGE OF 10-30 ug/mL MAY BE AN EFFECTIVE CONCENTRATION FOR MANY PATIENTS. HOWEVER,  SOME ARE BEST TREATED AT CONCENTRATIONS OUTSIDE THIS RANGE. ACETAMINOPHEN CONCENTRATIONS >150 ug/mL AT 4 HOURS AFTER INGESTION AND >50 ug/mL AT 12 HOURS AFTER INGESTION ARE OFTEN ASSOCIATED WITH TOXIC REACTIONS.   CBC     Status: Abnormal   Collection Time: 06/19/15  9:03 PM  Result Value Ref Range   WBC 12.1 (H) 3.8 - 10.6 K/uL   RBC 4.71 4.40 - 5.90 MIL/uL   Hemoglobin 15.0 13.0 - 18.0 g/dL   HCT 45.4 40.0 - 52.0 %   MCV 96.3 80.0 - 100.0 fL   MCH 31.8 26.0 - 34.0 pg   MCHC 33.0 32.0 - 36.0 g/dL   RDW 14.8 (H) 11.5 - 14.5 %   Platelets 280 150 - 440 K/uL  Urine Drug Screen, Qualitative ([facility] only)     Status: Abnormal   Collection Time: 06/19/15  9:03 PM  Result Value Ref Range   Tricyclic, Ur Screen POSITIVE (A) NONE DETECTED   Amphetamines, Ur Screen POSITIVE (A) NONE DETECTED   MDMA (Ecstasy)Ur Screen NONE DETECTED NONE DETECTED   Cocaine Metabolite,Ur [place] NONE DETECTED NONE DETECTED   Opiate, Ur Screen NONE DETECTED NONE DETECTED   Phencyclidine (PCP) Ur S NONE DETECTED NONE DETECTED   Cannabinoid 50 Ng, Ur [place] NONE DETECTED NONE DETECTED   Barbiturates, Ur Screen NONE DETECTED NONE DETECTED   Benzodiazepine, Ur Scrn POSITIVE (A) NONE DETECTED   Methadone Scn, Ur NONE DETECTED NONE DETECTED    Comment: (NOTE) 030  Tricyclics, urine               Cutoff 1000 ng/mL 200  Amphetamines, urine             Cutoff 1000 ng/mL 300  MDMA (Ecstasy), urine           Cutoff 500 ng/mL 400  Cocaine Metabolite, urine       Cutoff 300 ng/mL 500  Opiate, urine                   Cutoff 300 ng/mL 600  Phencyclidine  (PCP), urine      Cutoff 25 ng/mL 700  Cannabinoid, urine              Cutoff 50 ng/mL 800  Barbiturates, urine             Cutoff 200 ng/mL 900  Benzodiazepine, urine           Cutoff 200 ng/mL 1000 Methadone, urine                Cutoff 300 ng/mL 1100 1200 The urine drug screen provides only a preliminary, unconfirmed 1300 analytical test result and should not be used for non-medical 1400 purposes. Clinical consideration and professional judgment should 1500 be applied to any positive drug screen result due to possible 1600 interfering substances. A more specific alternate chemical method 1700 must be used in order to obtain a confirmed analytical result.  1800 Gas chromato graphy / mass spectrometry (GC/MS) is the preferred 1900 confirmatory method.   Urinalysis complete, with microscopic ([facility] only)     Status: Abnormal   Collection Time: 06/19/15  9:03 PM  Result Value Ref Range   Color, Urine YELLOW (A) YELLOW   APPearance CLEAR (A) CLEAR   Glucose, UA NEGATIVE NEGATIVE mg/dL   Bilirubin Urine NEGATIVE NEGATIVE   Ketones, ur NEGATIVE NEGATIVE mg/dL   Specific Gravity, Urine 1.010 1.005 - 1.030   Hgb urine dipstick NEGATIVE NEGATIVE   pH 5.0 5.0 -  8.0   Protein, ur NEGATIVE NEGATIVE mg/dL   Nitrite NEGATIVE NEGATIVE   Leukocytes, UA NEGATIVE NEGATIVE   RBC / HPF 0-5 0 - 5 RBC/hpf   WBC, UA 0-5 0 - 5 WBC/hpf   Bacteria, UA NONE SEEN NONE SEEN   Squamous Epithelial / LPF NONE SEEN NONE SEEN   Mucous PRESENT    Hyaline Casts, UA PRESENT   Glucose, capillary     Status: Abnormal   Collection Time: 06/19/15  9:06 PM  Result Value Ref Range   Glucose-Capillary 106 (H) 65 - 99 mg/dL  Blood gas, arterial ([facility] ONLY)     Status: Abnormal   Collection Time: 06/19/15  9:53 PM  Result Value Ref Range   FIO2 0.50    Delivery systems VENTILATOR    Mode ASSIST CONTROL    VT 500 mL   Peep/cpap 5.0 cm H20   pH, Arterial 7.29 (L) 7.350 - 7.450   pCO2 arterial 52 (H) 32.0 - 48.0  mmHg   pO2, Arterial 151 (H) 83.0 - 108.0 mmHg   Bicarbonate 25.0 21.0 - 28.0 mEq/L   Acid-base deficit 2.5 (H) 0.0 - 2.0 mmol/L   O2 Saturation 99.1 %   Patient temperature 37.0    Collection site RIGHT RADIAL    Sample type ARTERIAL DRAW    Allens test (pass/fail) PASS PASS   Mechanical Rate 16   MRSA PCR Screening     Status: None   Collection Time: 06/20/15 12:33 AM  Result Value Ref Range   MRSA by PCR NEGATIVE NEGATIVE    Comment:        The GeneXpert MRSA Assay (FDA approved for NASAL specimens only), is one component of a comprehensive MRSA colonization surveillance program. It is not intended to diagnose MRSA infection nor to guide or monitor treatment for MRSA infections.   Glucose, capillary     Status: Abnormal   Collection Time: 06/20/15 12:38 AM  Result Value Ref Range   Glucose-Capillary 135 (H) 65 - 99 mg/dL  Basic metabolic panel     Status: Abnormal   Collection Time: 06/20/15  1:25 AM  Result Value Ref Range   Sodium 139 135 - 145 mmol/L   Potassium 6.0 (H) 3.5 - 5.1 mmol/L   Chloride 103 101 - 111 mmol/L   CO2 26 22 - 32 mmol/L   Glucose, Bld 123 (H) 65 - 99 mg/dL   BUN 14 6 - 20 mg/dL   Creatinine, Ser 1.41 (H) 0.61 - 1.24 mg/dL   Calcium 9.6 8.9 - 10.3 mg/dL   GFR calc non Af Amer >60 >60 mL/min   GFR calc Af Amer >60 >60 mL/min    Comment: (NOTE) The eGFR has been calculated using the CKD EPI equation. This calculation has not been validated in all clinical situations. eGFR's persistently <60 mL/min signify possible Chronic Kidney Disease.    Anion gap 10 5 - 15  CBC     Status: Abnormal   Collection Time: 06/20/15  1:25 AM  Result Value Ref Range   WBC 10.1 3.8 - 10.6 K/uL   RBC 4.55 4.40 - 5.90 MIL/uL   Hemoglobin 14.5 13.0 - 18.0 g/dL   HCT 43.1 40.0 - 52.0 %   MCV 94.7 80.0 - 100.0 fL   MCH 32.0 26.0 - 34.0 pg   MCHC 33.8 32.0 - 36.0 g/dL   RDW 14.7 (H) 11.5 - 14.5 %   Platelets 268 150 - 440 K/uL  Triglycerides  Status:  Abnormal   Collection Time: 06/20/15  1:25 AM  Result Value Ref Range   Triglycerides 271 (H) <150 mg/dL  Blood gas, arterial     Status: Abnormal   Collection Time: 06/20/15  4:29 AM  Result Value Ref Range   FIO2 0.50    Delivery systems VENTILATOR    Mode PRESSURE REGULATED VOLUME CONTROL    VT 500 mL   LHR 18 resp/min   Peep/cpap 5.0 cm H20   pH, Arterial 7.28 (L) 7.350 - 7.450   pCO2 arterial 59 (H) 32.0 - 48.0 mmHg   pO2, Arterial 128 (H) 83.0 - 108.0 mmHg   Bicarbonate 27.7 21.0 - 28.0 mEq/L   Acid-base deficit 0.3 0.0 - 2.0 mmol/L   O2 Saturation 98.5 %   Patient temperature 37.0    Collection site RIGHT RADIAL    Sample type ARTERIAL DRAW    Allens test (pass/fail) POSITIVE (A) PASS   Mechanical Rate 18   Basic metabolic panel     Status: None   Collection Time: 06/20/15 11:55 AM  Result Value Ref Range   Sodium 142 135 - 145 mmol/L   Potassium 4.1 3.5 - 5.1 mmol/L   Chloride 107 101 - 111 mmol/L   CO2 26 22 - 32 mmol/L   Glucose, Bld 96 65 - 99 mg/dL   BUN 12 6 - 20 mg/dL   Creatinine, Ser 1.09 0.61 - 1.24 mg/dL   Calcium 9.7 8.9 - 10.3 mg/dL   GFR calc non Af Amer >60 >60 mL/min   GFR calc Af Amer >60 >60 mL/min    Comment: (NOTE) The eGFR has been calculated using the CKD EPI equation. This calculation has not been validated in all clinical situations. eGFR's persistently <60 mL/min signify possible Chronic Kidney Disease.    Anion gap 9 5 - 15    Current Facility-Administered Medications  Medication Dose Route Frequency Provider Last Rate Last Dose  . 0.9 %  sodium chloride infusion   Intravenous Continuous Lytle Butte, MD      . acetaminophen (TYLENOL) tablet 650 mg  650 mg Oral Q6H PRN Lytle Butte, MD       Or  . acetaminophen (TYLENOL) suppository 650 mg  650 mg Rectal Q6H PRN Lytle Butte, MD      . antiseptic oral rinse solution (CORINZ)  7 mL Mouth Rinse QID Lytle Butte, MD   7 mL at 06/20/15 1200  . atorvastatin (LIPITOR) tablet 80 mg   80 mg Oral Daily Lytle Butte, MD   80 mg at 06/20/15 1000  . chlorhexidine gluconate (PERIDEX) 0.12 % solution 15 mL  15 mL Mouth Rinse BID Lytle Butte, MD   15 mL at 06/20/15 0800  . citalopram (CELEXA) tablet 40 mg  40 mg Oral Daily Lytle Butte, MD   40 mg at 06/20/15 1339  . diazepam (VALIUM) tablet 5 mg  5 mg Oral Q6H PRN Flora Lipps, MD   5 mg at 06/20/15 1403  . famotidine (PEPCID) IVPB 20 mg premix  20 mg Intravenous Q12H Lytle Butte, MD   20 mg at 06/20/15 1339  . heparin injection 5,000 Units  5,000 Units Subcutaneous 3 times per day Lytle Butte, MD   5,000 Units at 06/20/15 1339  . meloxicam (MOBIC) tablet 15 mg  15 mg Oral Daily Flora Lipps, MD   15 mg at 06/20/15 1527  . ondansetron (ZOFRAN) tablet 4 mg  4 mg  Oral Q6H PRN Lytle Butte, MD       Or  . ondansetron [facility]) injection 4 mg  4 mg Intravenous Q6H PRN Lytle Butte, MD      . propofol (DIPRIVAN) 1000 MG/100ML infusion  5-80 mcg/kg/min Intravenous Once Schuyler Amor, MD   5 mcg/kg/min at 06/19/15 2324  . propofol (DIPRIVAN) 1000 MG/100ML infusion  5-80 mcg/kg/min Intravenous Titrated Lytle Butte, MD 38 mL/hr at 06/20/15 0535 40 mcg/kg/min at 06/20/15 0535  . QUEtiapine (SEROQUEL) tablet 400 mg  400 mg Oral QHS Lytle Butte, MD   400 mg at 06/20/15 0130  . sodium chloride flush (NS) 0.9 % injection 3 mL  3 mL Intravenous Q12H Lytle Butte, MD   3 mL at 06/20/15 1000    Musculoskeletal: Strength & Muscle Tone: decreased Gait & Station: unable to stand Patient leans: N/A  Psychiatric Specialty Exam: Review of Systems  Constitutional: Negative.   HENT: Negative.   Eyes: Negative.   Respiratory: Negative.   Cardiovascular: Negative.   Gastrointestinal: Negative.   Musculoskeletal: Negative.   Skin: Negative.   Neurological: Negative.   Psychiatric/Behavioral: Positive for memory loss. Negative for depression, suicidal ideas, hallucinations and substance abuse. The patient is nervous/anxious and has  insomnia.     Blood pressure 115/72, pulse 105, temperature 98 F (36.7 C), temperature source Axillary, resp. rate 18, height 6' 2"  (1.88 m), weight 155.2 kg (342 lb 2.5 oz), SpO2 96 %.Body mass index is 43.91 kg/(m^2).  General Appearance: Fairly Groomed  Engineer, water::  Fair  Speech:  Garbled  Volume:  Decreased  Mood:  Anxious  Affect:  Tearful  Thought Process:  Linear  Orientation:  Full (Time, Place, and Person)  Thought Content:  Negative  Suicidal Thoughts:  No  Homicidal Thoughts:  No  Memory:  Immediate;   Fair Recent;   Poor Remote;   Fair  Judgement:  Impaired  Insight:  Shallow  Psychomotor Activity:  Psychomotor Retardation  Concentration:  Poor  Recall:  Poor  Fund of Knowledge:Fair  Language: Fair  Akathisia:  No  Handed:  Right  AIMS (if indicated):     Assets:  Housing Social Support  ADL's:  Intact  Cognition: WNL and Impaired,  Mild  Sleep:      Treatment Plan Summary: Daily contact with patient to assess and evaluate symptoms and progress in treatment, Medication management and Plan 35 year old man came in to the hospital with an overdose of benzodiazepines. He tries to convince me that it was actually an overdose of narcotics as though that were somehow better. I think he knows that his biggest fear would be to have his Valium cutoff. Patient's drug screen is negative for opiates. Narcan did not wake him up. It seems pretty clear that this is likely to be misuse of his Valium. Patient's parents called the nursing station and asked that I call them but he has declined to give me permission. Patient does not need a Actuary. I don't think he is high risk for suicide in the hospital. We will continue to follow daily. Patient counseled about the obvious ongoing dangers of his benzodiazepines.  Disposition: Patient does not meet criteria for psychiatric inpatient admission. Supportive therapy provided about ongoing stressors.  Kiele Heavrin 06/20/2015 6:35 PM

## 2015-06-20 NOTE — Progress Notes (Signed)
[facility] - [place] at [facility]   PATIENT NAME: Ian [name]    MR#:  [id number]  DATE OF BIRTH:  05-09-1981  SUBJECTIVE:  CHIEF COMPLAINT:  Patient is on mechanical ventilator. No family members at bedside. Sedated  REVIEW OF SYSTEMS:  Unobtainable as the patient is on mechanical ventilator with sedation  DRUG ALLERGIES:  No Known Allergies  VITALS:  Blood pressure 125/82, pulse 85, temperature 98 F (36.7 C), temperature source Axillary, resp. rate 18, height  (1.88 m), weight 155.2 kg (342 lb 2.5 oz), SpO2 96 %.  PHYSICAL EXAMINATION:  GENERAL:  35 y.o.-year-old patient lying in the bed with no acute distress.  EYES: Pupils equal, round, reactive to light and accommodation. No scleral icterus.  HEENT: Head atraumatic, normocephalic. Oropharynx and nasopharynx clear. ET tube is intact NECK:  Supple, no jugular venous distention. No thyroid enlargement, no tenderness.  LUNGS: Normal breath sounds bilaterally, no wheezing, rales,rhonchi or crepitation. No use of accessory muscles of respiration.  CARDIOVASCULAR: S1, S2 normal. No murmurs, rubs, or gallops.  ABDOMEN: Soft, nontender, nondistended. Bowel sounds present. No organomegaly or mass.  EXTREMITIES: No pedal edema, cyanosis, or clubbing.  NEUROLOGIC: Patient is currently sedated, on mechanical ventilator  PSYCHIATRIC: The patient is sedated  SKIN: No obvious rash, lesion, or ulcer.    LABORATORY PANEL:   CBC  Recent Labs Lab 06/20/15 0125  WBC 10.1  HGB 14.5  HCT 43.1  PLT 268   ------------------------------------------------------------------------------------------------------------------  Chemistries   Recent Labs Lab 06/19/15 2103  06/20/15 1155  NA 139  < > 142  K 5.8*  < > 4.1  CL 101  < > 107  CO2 26  < > 26  GLUCOSE 123*  < > 96  BUN 14  < > 12  CREATININE 1.57*  < > 1.09  CALCIUM 10.3  < > 9.7  AST 35  --   --   ALT 49  --   --   ALKPHOS 116  --   --    BILITOT 0.6  --   --   < > = values in this interval not displayed. ------------------------------------------------------------------------------------------------------------------  Cardiac Enzymes  Recent Labs Lab 06/19/15 2103  TROPONINI <0.03   ------------------------------------------------------------------------------------------------------------------  RADIOLOGY:  Dg Ankle Complete Left  06/19/2015  CLINICAL DATA:  Pain following fall during seizure EXAM: LEFT ANKLE COMPLETE - 3+ VIEW COMPARISON:  None. FINDINGS: Frontal, oblique, and lateral views were obtained. There is soft tissue swelling. There is a fracture of the proximal aspect of the medial malleolus with mild displacement of fracture fragments. There is also a fracture of the posterior tibial metaphysis with mild displacement of fracture fragments. No other fractures are evident. The ankle mortise is grossly intact on this study. There is mild spurring along the dorsal mid to distal talus. IMPRESSION: Displaced fractures of the proximal medial malleolus and posterior distal tibial metaphysis. Ankle mortise grossly intact. Underlying osteoarthritic change. Generalized soft tissue swelling. These results will be called to the ordering clinician or representative by the Radiologist Assistant, and communication documented in the PACS or zVision Dashboard. Electronically Signed   By: Bretta Bang III M.D.   On: 06/19/2015 10:17   Ct Head Wo Contrast  06/19/2015  CLINICAL DATA:  Patient was found unresponsive. Improved with Narcan. Patient has been taking narcotics for leg fracture. EXAM: CT HEAD WITHOUT CONTRAST TECHNIQUE: Contiguous axial images were obtained from the base of the skull through the vertex without intravenous contrast. COMPARISON:  MRI brain 03/23/2015.  CT head 03/23/2015. FINDINGS: Patient is intubated. Ventricles and sulci appear symmetrical. No ventricular dilatation. No mass effect or midline shift. No  abnormal extra-axial fluid collections. Gray-white matter junctions are distinct. Basal cisterns are not effaced. No evidence of acute intracranial hemorrhage. No depressed skull fractures. Visualized paranasal sinuses and mastoid air cells are not opacified. IMPRESSION: No acute intracranial abnormalities. Electronically Signed   By: Burman Nieves M.D.   On: 06/19/2015 21:47   Dg Chest Port 1 View  06/19/2015  CLINICAL DATA:  Initial evaluation for unresponsiveness, status post intubation. EXAM: PORTABLE CHEST 1 VIEW COMPARISON:  None. FINDINGS: Endotracheal tube in place with tip approximately 2.7 cm 1.5 cm above the carina. Accentuation of the cardiac silhouette related AP technique, shallow lung inflation, and lordotic angulation. This also likely accounts for apparent mediastinal widening. Lungs are hypoinflated. Perihilar vascular congestion with bronchovascular crowding. There is dense opacity within the retrocardiac left lower lobe, which may reflect atelectasis or possibly infiltrate. Aspiration could be considered. More mild patchy opacity at the right lung base. No pneumothorax. No definite pleural effusion. No acute osseous abnormality. IMPRESSION: 1. Tip of endotracheal tube approximately 1.5 cm above the carina. 2. Shallow lung inflation with secondary pulmonary vascular congestion and bronchovascular crowding. Left greater than right bibasilar opacities favored to reflect atelectasis/bronchovascular crowding. Possible aspiration or infectious pneumonitis could also be considered, particularly at the retrocardiac left lower lobe. Electronically Signed   By: Rise Mu M.D.   On: 06/19/2015 21:33   Dg Abd Portable 1v  06/20/2015  CLINICAL DATA:  NG tube placement.  Patient was found unresponsive. EXAM: PORTABLE ABDOMEN - 1 VIEW COMPARISON:  None. FINDINGS: Enteric tube tip is demonstrated in the upper mid abdomen consistent with location in the distal stomach. Scattered stool in the  visualize colon. No bowel distention. IMPRESSION: Enteric tube tip is localized in the right upper quadrant consistent with location in the distal stomach. Electronically Signed   By: Burman Nieves M.D.   On: 06/20/2015 02:14   Dg Foot Complete Left  06/19/2015  CLINICAL DATA:  Pain following fall during seizure EXAM: LEFT FOOT - COMPLETE 3+ VIEW COMPARISON:  None. FINDINGS: Frontal, oblique, and lateral views were obtained. There is evidence of a slightly displaced fracture of the proximal portion of the medial malleolus. There is also a fracture of the posterior aspect of the distal tibia with mild displacement of fracture fragments. In the foot region, no fracture or dislocation is evident. Joint spaces appear intact. No erosive change. IMPRESSION: Fractures of the proximal aspect of the medial malleolus and the posterior distal tibial metaphysis with mild displacement of fracture fragments. No fracture or dislocation in the foot region. No appreciable arthropathic change. These results will be called to the ordering clinician or representative by the Radiologist Assistant, and communication documented in the PACS or zVision Dashboard. Electronically Signed   By: Bretta Bang III M.D.   On: 06/19/2015 10:15    EKG:   Orders placed or performed during the hospital encounter of 06/19/15  . EKG 12-Lead  . EKG 12-Lead  . ED EKG  . ED EKG    ASSESSMENT AND PLAN:   1. Acute respiratory failure, hypercapnic: Continue full vent support, wean FiO2 and PEEP as tolerated, wean off as tolerated and planning to extubate today. reassess ABG 2. Benzodiazepine overdose: Not sure whether intentional or unintentional, patient will be monitored by a sitter after extubation for safety. Psych consult is placed for possible  IVC and evaluation 3. Hyperkalemia: Repeat potassium after Kayexalate and IV fluid hydration is at 4.1  IV. Hyperlipidemia unspecified statin therapy, will consider to resume after  extubation 5. Venous thrombosis prophylactic: Heparin subcutaneous 6.Fractures of the proximal aspect of the medial malleolus and the posterior distal tibial metaphysis with mild displacement of fracture fragments.: Will consult orthopedics     All the records are reviewed and case discussed with Care Management/Social Workerr. Management plans discussed with the patient, family and they are in agreement.  CODE STATUS: fc   TOTAL CRITICAL CARE TIME TAKING CARE OF THIS PATIENT: 40  minutes.   POSSIBLE D/C IN 2-3 DAYS, DEPENDING ON CLINICAL CONDITION.   Ramonita Lab M.D on 06/20/2015 at 1:39 PM  Between 7am to 6pm - Pager - [phone] After 6pm go to [email or web address] - password [email or web address] [facility]  [facility]  Office  [phone]  CC: Primary care physician; No primary care provider on file.

## 2015-06-20 NOTE — Consult Note (Signed)
[facility] Consultation      Name: Ian [name] MRN: [id number] DOB: [birth year]    ADMISSION DATE:  06/19/2015   CHIEF COMPLAINT:  Acute resp failure  Acute reses HISTORY OF PRESENT ILLNESS  35 yo white male intubated for acute resp failure and air way protection Will plan for SAT/SBt today  - known history of benzodiazepine abuse presenting in unresponsive state. -patient was seen at [facility] urgent care earlier today diagnosed with ankle fracture and subsequent discharge. - Later in the day he is noted to be unresponsive at home. With respirations of 3 received doses of Narcan without response subsequently intubated.     PAST MEDICAL HISTORY    :  Past Medical History  Diagnosis Date  . Hypertension   . Hypercholesteremia   . Anxiety   . Depression    History reviewed. No pertinent past surgical history. Prior to Admission medications   Medication Sig Start Date End Date Taking? Authorizing Provider  atorvastatin (LIPITOR) 80 MG tablet Take 80 mg by mouth daily.   Yes Historical Provider, MD  citalopram (CELEXA) 40 MG tablet Take 1 tablet (40 mg total) by mouth daily. 05/05/15  Yes Marjie Skiff, MD  diazepam (VALIUM) 5 MG tablet Take 1 tablet (5 mg total) by mouth every 6 (six) hours as needed for anxiety. 05/05/15  Yes Marjie Skiff, MD  meloxicam (MOBIC) 15 MG tablet Take 1 tablet (15 mg total) by mouth daily. 06/19/15  Yes Frederich Cha, MD  QUEtiapine (SEROQUEL) 400 MG tablet Take 1 tablet (400 mg total) by mouth at bedtime. 05/05/15  Yes Marjie Skiff, MD  traZODone (DESYREL) 100 MG tablet Take 2 tablets (200 mg total) by mouth at bedtime as needed for sleep. 05/05/15  Yes Marjie Skiff, MD   No Known Allergies   FAMILY HISTORY   Family History  Problem Relation Age of Onset  . Heart attack Mother   . Hypertension Mother   . Hypercholesterolemia Mother   . Heart attack Father   . Hypertension Father   .  Hypercholesterolemia Father   . Depression Maternal Uncle   . Suicidality Maternal Uncle   . Depression Cousin   . Suicidality Cousin       SOCIAL HISTORY    reports that he quit smoking about 2 months ago. His smoking use included Cigarettes. He has a 12 pack-year smoking history. He has never used smokeless tobacco. He reports that he does not drink alcohol or use illicit drugs.  Review of Systems  Unable to perform ROS: critical illness      VITAL SIGNS    Temp:  [96.4 F (35.8 C)-97.6 F (36.4 C)] 97.6 F (36.4 C) (01/28 0038) Pulse Rate:  [74-119] 84 (01/28 0500) Resp:  [9-23] 18 (01/28 0500) BP: (101-140)/(62-101) 117/86 mmHg (01/28 0500) SpO2:  [96 %-100 %] 100 % (01/28 0500) FiO2 (%):  [35 %-50 %] 35 % (01/28 0728) Weight:  [342 lb 2.5 oz (155.2 kg)-350 lb (158.759 kg)] 342 lb 2.5 oz (155.2 kg) (01/28 0038) HEMODYNAMICS:   VENTILATOR SETTINGS: Vent Mode:  [-] PRVC FiO2 (%):  [35 %-50 %] 35 % Set Rate:  [16 bmp-18 bmp] 18 bmp Vt Set:  [500 mL] 500 mL PEEP:  [5 cmH20] 5 cmH20 INTAKE / OUTPUT:  Intake/Output Summary (Last 24 hours) at 06/20/15 0842 Last data filed at 06/20/15 0535  Gross per 24 hour  Intake      0 ml  Output  1200 ml  Net  -1200 ml       PHYSICAL EXAM   Physical Exam  Constitutional: No distress.  HENT:  Head: Normocephalic and atraumatic.  Eyes: Pupils are equal, round, and reactive to light. No scleral icterus.  Neck: Normal range of motion. Neck supple.  Cardiovascular: Normal rate and regular rhythm.   No murmur heard. Pulmonary/Chest: No respiratory distress. He has no wheezes. He has rales.  resp distress  Abdominal: Soft. He exhibits no distension. There is no tenderness.  Musculoskeletal: He exhibits no edema.  Neurological: He displays normal reflexes. Coordination normal.  gcs<8T  Skin: Skin is warm. No rash noted. He is diaphoretic.       LABS   LABS:  CBC  Recent Labs Lab 06/19/15 2103 06/20/15 0125   WBC 12.1* 10.1  HGB 15.0 14.5  HCT 45.4 43.1  PLT 280 268   Coag's No results for input(s): APTT, INR in the last 168 hours. BMET  Recent Labs Lab 06/19/15 2103 06/20/15 0125  NA 139 139  K 5.8* 6.0*  CL 101 103  CO2 26 26  BUN 14 14  CREATININE 1.57* 1.41*  GLUCOSE 123* 123*   Electrolytes  Recent Labs Lab 06/19/15 2103 06/20/15 0125  CALCIUM 10.3 9.6   Sepsis Markers No results for input(s): LATICACIDVEN, PROCALCITON, O2SATVEN in the last 168 hours. ABG  Recent Labs Lab 06/19/15 2153 06/20/15 0429  PHART 7.29* 7.28*  PCO2ART 52* 59*  PO2ART 151* 128*   Liver Enzymes  Recent Labs Lab 06/19/15 2103  AST 35  ALT 49  ALKPHOS 116  BILITOT 0.6  ALBUMIN 5.0   Cardiac Enzymes  Recent Labs Lab 06/19/15 2103  TROPONINI <0.03   Glucose  Recent Labs Lab 06/19/15 2106 06/20/15 0038  GLUCAP 106* 135*     Recent Results (from the past 240 hour(s))  MRSA PCR Screening     Status: None   Collection Time: 06/20/15 12:33 AM  Result Value Ref Range Status   MRSA by PCR NEGATIVE NEGATIVE Final    Comment:        The GeneXpert MRSA Assay (FDA approved for NASAL specimens only), is one component of a comprehensive MRSA colonization surveillance program. It is not intended to diagnose MRSA infection nor to guide or monitor treatment for MRSA infections.      Current facility-administered medications:  .  0.9 %  sodium chloride infusion, , Intravenous, Continuous, Lytle Butte, MD .  acetaminophen (TYLENOL) tablet 650 mg, 650 mg, Oral, Q6H PRN **OR** acetaminophen (TYLENOL) suppository 650 mg, 650 mg, Rectal, Q6H PRN, Lytle Butte, MD .  antiseptic oral rinse solution (CORINZ), 7 mL, Mouth Rinse, QID, Lytle Butte, MD, 7 mL at 06/20/15 0528 .  atorvastatin (LIPITOR) tablet 80 mg, 80 mg, Oral, Daily, Lytle Butte, MD .  calcium gluconate 1 g in sodium chloride 0.9 % 100 mL IVPB, 1 g, Intravenous, Once, Colbert Coyer, MD .   chlorhexidine gluconate (PERIDEX) 0.12 % solution 15 mL, 15 mL, Mouth Rinse, BID, Lytle Butte, MD, 15 mL at 06/20/15 0130 .  citalopram (CELEXA) tablet 40 mg, 40 mg, Oral, Daily, Lytle Butte, MD .  dextrose 50 % solution 50 mL, 1 ampule, Intravenous, Once, Colbert Coyer, MD .  famotidine (PEPCID) IVPB 20 mg premix, 20 mg, Intravenous, Q12H, Lytle Butte, MD, 20 mg at 06/20/15 0140 .  heparin injection 5,000 Units, 5,000 Units, Subcutaneous, 3 times per day, Lytle Butte,  MD, 5,000 Units at 06/20/15 0529 .  insulin aspart (novoLOG) injection 10 Units, 10 Units, Intravenous, Once, Colbert Coyer, MD .  ondansetron (ZOFRAN) tablet 4 mg, 4 mg, Oral, Q6H PRN **OR** ondansetron (ZOFRAN) injection 4 mg, 4 mg, Intravenous, Q6H PRN, Lytle Butte, MD .  propofol (DIPRIVAN) 1000 MG/100ML infusion, 5-80 mcg/kg/min, Intravenous, Once, Schuyler Amor, MD, 5 mcg/kg/min at 06/19/15 2324 .  propofol (DIPRIVAN) 1000 MG/100ML infusion, 5-80 mcg/kg/min, Intravenous, Titrated, Lytle Butte, MD, Last Rate: 38 mL/hr at 06/20/15 0535, 40 mcg/kg/min at 06/20/15 0535 .  QUEtiapine (SEROQUEL) tablet 400 mg, 400 mg, Oral, QHS, Lytle Butte, MD, 400 mg at 06/20/15 0130 .  sodium chloride flush (NS) 0.9 % injection 3 mL, 3 mL, Intravenous, Q12H, Lytle Butte, MD, 3 mL at 06/20/15 0131 .  sodium polystyrene (KAYEXALATE) 15 GM/60ML suspension 30 g, 30 g, Per Tube, Once, Colbert Coyer, MD  IMAGING    Dg Ankle Complete Left  06/19/2015  CLINICAL DATA:  Pain following fall during seizure EXAM: LEFT ANKLE COMPLETE - 3+ VIEW COMPARISON:  None. FINDINGS: Frontal, oblique, and lateral views were obtained. There is soft tissue swelling. There is a fracture of the proximal aspect of the medial malleolus with mild displacement of fracture fragments. There is also a fracture of the posterior tibial metaphysis with mild displacement of fracture fragments. No other fractures are evident. The ankle mortise is  grossly intact on this study. There is mild spurring along the dorsal mid to distal talus. IMPRESSION: Displaced fractures of the proximal medial malleolus and posterior distal tibial metaphysis. Ankle mortise grossly intact. Underlying osteoarthritic change. Generalized soft tissue swelling. These results will be called to the ordering clinician or representative by the Radiologist Assistant, and communication documented in the PACS or zVision Dashboard. Electronically Signed   By: Lowella Grip III M.D.   On: 06/19/2015 10:17   Ct Head Wo Contrast  06/19/2015  CLINICAL DATA:  Patient was found unresponsive. Improved with Narcan. Patient has been taking narcotics for leg fracture. EXAM: CT HEAD WITHOUT CONTRAST TECHNIQUE: Contiguous axial images were obtained from the base of the skull through the vertex without intravenous contrast. COMPARISON:  MRI brain 03/23/2015.  CT head 03/23/2015. FINDINGS: Patient is intubated. Ventricles and sulci appear symmetrical. No ventricular dilatation. No mass effect or midline shift. No abnormal extra-axial fluid collections. Gray-white matter junctions are distinct. Basal cisterns are not effaced. No evidence of acute intracranial hemorrhage. No depressed skull fractures. Visualized paranasal sinuses and mastoid air cells are not opacified. IMPRESSION: No acute intracranial abnormalities. Electronically Signed   By: Lucienne Capers M.D.   On: 06/19/2015 21:47   Dg Chest Port 1 View  06/19/2015  CLINICAL DATA:  Initial evaluation for unresponsiveness, status post intubation. EXAM: PORTABLE CHEST 1 VIEW COMPARISON:  None. FINDINGS: Endotracheal tube in place with tip approximately 2.7 cm 1.5 cm above the carina. Accentuation of the cardiac silhouette related AP technique, shallow lung inflation, and lordotic angulation. This also likely accounts for apparent mediastinal widening. Lungs are hypoinflated. Perihilar vascular congestion with bronchovascular crowding. There  is dense opacity within the retrocardiac left lower lobe, which may reflect atelectasis or possibly infiltrate. Aspiration could be considered. More mild patchy opacity at the right lung base. No pneumothorax. No definite pleural effusion. No acute osseous abnormality. IMPRESSION: 1. Tip of endotracheal tube approximately 1.5 cm above the carina. 2. Shallow lung inflation with secondary pulmonary vascular congestion and bronchovascular crowding. Left greater than right bibasilar  opacities favored to reflect atelectasis/bronchovascular crowding. Possible aspiration or infectious pneumonitis could also be considered, particularly at the retrocardiac left lower lobe. Electronically Signed   By: Jeannine Boga M.D.   On: 06/19/2015 21:33   Dg Abd Portable 1v  06/20/2015  CLINICAL DATA:  NG tube placement.  Patient was found unresponsive. EXAM: PORTABLE ABDOMEN - 1 VIEW COMPARISON:  None. FINDINGS: Enteric tube tip is demonstrated in the upper mid abdomen consistent with location in the distal stomach. Scattered stool in the visualize colon. No bowel distention. IMPRESSION: Enteric tube tip is localized in the right upper quadrant consistent with location in the distal stomach. Electronically Signed   By: Lucienne Capers M.D.   On: 06/20/2015 02:14   Dg Foot Complete Left  06/19/2015  CLINICAL DATA:  Pain following fall during seizure EXAM: LEFT FOOT - COMPLETE 3+ VIEW COMPARISON:  None. FINDINGS: Frontal, oblique, and lateral views were obtained. There is evidence of a slightly displaced fracture of the proximal portion of the medial malleolus. There is also a fracture of the posterior aspect of the distal tibia with mild displacement of fracture fragments. In the foot region, no fracture or dislocation is evident. Joint spaces appear intact. No erosive change. IMPRESSION: Fractures of the proximal aspect of the medial malleolus and the posterior distal tibial metaphysis with mild displacement of fracture  fragments. No fracture or dislocation in the foot region. No appreciable arthropathic change. These results will be called to the ordering clinician or representative by the Radiologist Assistant, and communication documented in the PACS or zVision Dashboard. Electronically Signed   By: Lowella Grip III M.D.   On: 06/19/2015 10:15      Indwelling Urinary Catheter continued, requirement due to   Reason to continue Indwelling Urinary Catheter for strict Intake/Output monitoring for hemodynamic instability         Ventilator continued, requirement due to, resp failure    Ventilator Sedation RASS 0 to -2   MICRO DATA: MRSA PCR >NEG Urine  Blood Resp   ASSESSMENT/PLAN   35 yo white male with a cute resp failrue from acute metabolic encephalopathy from acute DRUg OD   PULMONARY -Respiratory Failure -will perform SAT/SBt when respiratory parameters are met   CARDIOVASCULAR Cont ICU monitoring  RENAL Follow chem 7, foley catheter, follow uO  GASTROINTESTINAL Npo for now  HEMATOLOGIC follow CBC    NEUROLOGIC Wean sedation, assess neuro status    The Patient requires high complexity decision making for assessment and support, frequent evaluation and titration of therapies, application of advanced monitoring technologies and extensive interpretation of multiple databases. Critical Care Time devoted to patient care services described in this note is 35 minutes.   Overall, patient is critically ill, prognosis is guarded. Patient at high risk for cardiac arrest and death.    Corrin Parker, M.D.  [facility]  Medical Director [place] Director [facility] Cardio-Pulmonary Department

## 2015-06-20 NOTE — Progress Notes (Signed)
Dr. Belia Heman at bedside.  Pt's Diprovan infusion stopped.  Pt waking up, and coughing around ET tube.  Pt follows commands.  Dr. Belia Heman changed Vent mode to spontaneous.  Pt breathing well around the tube.  Pt extubated by RT.  Pt speaking with hoarse voice.  NAD noted.  Ian [name] placed on pt at 2lpm.  Pt confused, repeating statements like "i'm sorry" and "Hey buddy."  He will answer in the neg or affirmative but can not answer complicated questions.

## 2015-06-20 NOTE — Progress Notes (Signed)
Extubated without complications to 2lnc 

## 2015-06-20 NOTE — Progress Notes (Signed)
eLink Physician-Brief Progress Note Patient Name: Ian [name] DOB: [birth year] MRN: [id number]   Date of Service  06/20/2015  HPI/Events of Note  Hyperkalemia with K level of 6.0.  No overt EKG changes but increase from 5.8 to 6.0  eICU Interventions  Plan: Ca Gluconate IV Insulin/D50 Kayexalate Repeat BMET in afternoon     Intervention Category Major Interventions: Electrolyte abnormality - evaluation and management  DETERDING,ELIZABETH 06/20/2015, 6:24 AM

## 2015-06-20 NOTE — Progress Notes (Signed)
°   06/20/15 1700  Clinical Encounter Type  Visited With Patient;Health care provider  Visit Type Initial  Referral From Patient  Consult/Referral To Chaplain  Spiritual Encounters  Spiritual Needs Prayer;Emotional  Stress Factors  Patient Stress Factors Family relationships;Loss  Family Stress Factors Not reviewed  Pastoral care visit.  Offered prayer and comfort. Chaplain [employer] Ext [phone]

## 2015-06-20 NOTE — Progress Notes (Addendum)
Paged prime doc and spoke with Dr. Joneen Roach concerning possibly discontinuing normal saline drip at 137ml/hr.  Pt is currently drinking and eating and has adequate urine output.  Creatinine level  Is within normal limits. Per MD, go ahead and discontinue normal saline drip.

## 2015-06-21 ENCOUNTER — Inpatient Hospital Stay: Payer: Self-pay

## 2015-06-21 LAB — BASIC METABOLIC PANEL
ANION GAP: 8 (ref 5–15)
BUN: 13 mg/dL (ref 6–20)
CALCIUM: 8.6 mg/dL — AB (ref 8.9–10.3)
CO2: 29 mmol/L (ref 22–32)
Chloride: 102 mmol/L (ref 101–111)
Creatinine, Ser: 1.12 mg/dL (ref 0.61–1.24)
GFR calc non Af Amer: >60 mL/min (ref >60)
Glucose, Bld: 100 mg/dL — ABNORMAL HIGH (ref 65–99)
Potassium: 3.4 mmol/L — ABNORMAL LOW (ref 3.5–5.1)
SODIUM: 139 mmol/L (ref 135–145)

## 2015-06-21 LAB — CBC
HEMATOCRIT: 38.3 % — AB (ref 40.0–52.0)
HEMOGLOBIN: 12.9 g/dL — AB (ref 13.0–18.0)
MCH: 32.5 pg (ref 26.0–34.0)
MCHC: 33.7 g/dL (ref 32.0–36.0)
MCV: 96.4 fL (ref 80.0–100.0)
Platelets: 218 10*3/uL (ref 150–440)
RBC: 3.98 MIL/uL — ABNORMAL LOW (ref 4.40–5.90)
RDW: 14.6 % — AB (ref 11.5–14.5)
WBC: 8.2 10*3/uL (ref 3.8–10.6)

## 2015-06-21 MED ORDER — FAMOTIDINE 20 MG PO TABS: 20.0000 mg | ORAL_TABLET | Freq: Two times a day (BID) | ORAL | Status: DC

## 2015-06-21 MED ORDER — ACETAMINOPHEN 325 MG PO TABS: 650.0000 mg | ORAL_TABLET | Freq: Four times a day (QID) | ORAL | Status: AC | PRN

## 2015-06-21 NOTE — Progress Notes (Signed)
Patient is very indecisive on whether or not to discharge. He states that the orthopedic doctor will take forever to come see him and he has things to do at home. He states he want to leave, and "im not worried about my ankle." He has to go home to "take care of my thousand dollar fish." Dr Martha Clan paged and notified of patient wanting to leave. MD states he will be here around noon. Patient states that he will wait for the doctor.

## 2015-06-21 NOTE — Discharge Instructions (Signed)
Activity as recommended by ortho with splint on left leg Diet- low fatop f/u with pcp in 3 days Op f/u with ortho and psychiatry in 1-2 weeks

## 2015-06-21 NOTE — Discharge Summary (Signed)
[facility] - [place] at [facility]   PATIENT NAME: Ian [name]    MR#:  [id number]  DATE OF BIRTH:  02-22-1989  DATE OF ADMISSION:  06/18/2033 ADMITTING PHYSICIAN: Wyatt Haste, MD  DATE OF DISCHARGE: 06/20/33  PRIMARY CARE PHYSICIAN: No primary care provider on file.    ADMISSION DIAGNOSIS:  Unresponsive [R40.4] Acute respiratory failure, unspecified whether with hypoxia or hypercapnia (HCC) [J96.00] Altered mental status, unspecified altered mental status type [R41.82]  DISCHARGE DIAGNOSIS:  Acute respiratory failure Benzodiazepine overdose  Left ankle fracture  SECONDARY DIAGNOSIS:   Past Medical History  Diagnosis Date  . Hypertension   . Hypercholesteremia   . Anxiety   . Depression     HOSPITAL COURSE:   1. Acute respiratory failure, hypercapnic: Secondary to benzodiazepine overdose Initially patient was intubated, weaned FiO2 and PEEP as tolerated, and patient got extubated  2. Benzodiazepine overdose: Seems to be unintentional Evaluated by psychiatry, according to their not pt is misusing valium  3. Hyperkalemia: Repeat potassium after Kayexalate and IV fluid hydration is at 4.1   4.. Hyperlipidemia unspecified statin therapy,  resumed  after extubation  5. Venous thrombosis prophylactic: Heparin subcutaneous  6.Fractures of the proximal aspect of the medial malleolus and the posterior distal tibial metaphysis with mild displacement of fracture fragments.: - pt was seen by orhto - left leg splint is placed outpatient follow-up with orthopedic in 2 weeks is recommended    DISCHARGE CONDITIONS:   fair  CONSULTS OBTAINED:  Treatment Team:  Wyatt Haste, MD Audery Amel, MD Juanell Fairly, MD   PROCEDURES left ankle splint placement  DRUG ALLERGIES:  No Known Allergies  DISCHARGE MEDICATIONS:   Current Discharge Medication List    START taking these medications   Details  acetaminophen (TYLENOL) 325 MG  tablet Take 2 tablets (650 mg total) by mouth every 6 (six) hours as needed for mild pain (or Fever >/= 101).      CONTINUE these medications which have NOT CHANGED   Details  atorvastatin (LIPITOR) 80 MG tablet Take 80 mg by mouth daily.    citalopram (CELEXA) 40 MG tablet Take 1 tablet (40 mg total) by mouth daily. Qty: 30 tablet, Refills: 5    diazepam (VALIUM) 5 MG tablet Take 1 tablet (5 mg total) by mouth every 6 (six) hours as needed for anxiety. Qty: 120 tablet, Refills: 4    meloxicam (MOBIC) 15 MG tablet Take 1 tablet (15 mg total) by mouth daily. Qty: 30 tablet, Refills: 1    QUEtiapine (SEROQUEL) 400 MG tablet Take 1 tablet (400 mg total) by mouth at bedtime. Qty: 30 tablet, Refills: 5    traZODone (DESYREL) 100 MG tablet Take 2 tablets (200 mg total) by mouth at bedtime as needed for sleep. Qty: 60 tablet, Refills: 5         DISCHARGE INSTRUCTIONS:   Activity as recommended by ortho with splint on left leg Diet- low fatop f/u with pcp in 3 days Op f/u with ortho and psychiatry in 1-2 weeks   DIET:  Low fat  DISCHARGE CONDITION:  Fair  ACTIVITY:  Activity as tolerated  OXYGEN:  Home Oxygen: No.   Oxygen Delivery: room air  DISCHARGE LOCATION:  home   If you experience worsening of your admission symptoms, develop shortness of breath, life threatening emergency, suicidal or homicidal thoughts you must seek medical attention immediately by calling 911 or calling your MD immediately  if symptoms less severe.  You  Must read complete instructions/literature along with all the possible adverse reactions/side effects for all the Medicines you take and that have been prescribed to you. Take any new Medicines after you have completely understood and accpet all the possible adverse reactions/side effects.   Please note  You were cared for by a hospitalist during your hospital stay. If you have any questions about your discharge medications or the care you  received while you were in the hospital after you are discharged, you can call the unit and asked to speak with the hospitalist on call if the hospitalist that took care of you is not available. Once you are discharged, your primary care physician will handle any further medical issues. Please note that NO REFILLS for any discharge medications will be authorized once you are discharged, as it is imperative that you return to your primary care physician (or establish a relationship with a primary care physician if you do not have one) for your aftercare needs so that they can reassess your need for medications and monitor your lab values.     Today  Chief Complaint  Patient presents with  . Drug Overdose   Patient is doing fine. Denies any chest pain or shortness of breath. He reports that he is going to go to [facility] after getting discharged from this hospital for orthopedic care. Reports that left ankle was fractured prior to this admission  ROS:  CONSTITUTIONAL: Denies fevers, chills. Denies any fatigue, weakness.  EYES: Denies blurry vision, double vision, eye pain. EARS, NOSE, THROAT: Denies tinnitus, ear pain, hearing loss. RESPIRATORY: Denies cough, wheeze, shortness of breath.  CARDIOVASCULAR: Denies chest pain, palpitations, edema.  GASTROINTESTINAL: Denies nausea, vomiting, diarrhea, abdominal pain. Denies bright red blood per rectum. GENITOURINARY: Denies dysuria, hematuria. ENDOCRINE: Denies nocturia or thyroid problems. HEMATOLOGIC AND LYMPHATIC: Denies easy bruising or bleeding. SKIN: Denies rash or lesion. MUSCULOSKELETAL: Denies pain in neck, back, shoulder, knees, hips or arthritic symptoms. Reports left ankle pain NEUROLOGIC: Denies paralysis, paresthesias.  PSYCHIATRIC: Denies anxiety or depressive symptoms.   VITAL SIGNS:  Blood pressure 130/87, pulse 100, temperature 98.7 F (37.1 C), temperature source Oral, resp. rate 16, height  (1.88 m), weight 158.305  kg (349 lb), SpO2 96 %.  I/O:   Intake/Output Summary (Last 24 hours) at 06/20/33 1334 Last data filed at 06/20/33 1205  Gross per 24 hour  Intake    790 ml  Output   3100 ml  Net  -2310 ml    PHYSICAL EXAMINATION:  GENERAL:  35 y.o.-year-old patient lying in the bed with no acute distress.  EYES: Pupils equal, round, reactive to light and accommodation. No scleral icterus. Extraocular muscles intact.  HEENT: Head atraumatic, normocephalic. Oropharynx and nasopharynx clear.  NECK:  Supple, no jugular venous distention. No thyroid enlargement, no tenderness.  LUNGS: Normal breath sounds bilaterally, no wheezing, rales,rhonchi or crepitation. No use of accessory muscles of respiration.  CARDIOVASCULAR: S1, S2 normal. No murmurs, rubs, or gallops.  ABDOMEN: Soft, non-tender, non-distended. Bowel sounds present. No organomegaly or mass.  EXTREMITIES: No pedal edema, cyanosis, or clubbing. Left ankle is tender, in a splint NEUROLOGIC: Cranial nerves II through XII are intact. Muscle strength 5/5 in all extremities. Sensation intact. Gait not checked.  PSYCHIATRIC: The patient is alert and oriented x 3.  SKIN: No obvious rash, lesion, or ulcer.   DATA REVIEW:   CBC  Recent Labs Lab 06/20/33 0340  WBC 8.2  HGB 12.9*  HCT 38.3*  PLT 218  Chemistries   Recent Labs Lab 06/19/15 2103  06/20/33 0340  NA 139  < > 139  K 5.8*  < > 3.4*  CL 101  < > 102  CO2 26  < > 29  GLUCOSE 123*  < > 100*  BUN 14  < > 13  CREATININE 1.57*  < > 1.12  CALCIUM 10.3  < > 8.6*  AST 35  --   --   ALT 49  --   --   ALKPHOS 116  --   --   BILITOT 0.6  --   --   < > = values in this interval not displayed.  Cardiac Enzymes  Recent Labs Lab 06/19/15 2103  TROPONINI <0.03    Microbiology Results  Results for orders placed or performed during the hospital encounter of 06/19/15  MRSA PCR Screening     Status: None   Collection Time: 06/20/15 12:33 AM  Result Value Ref Range Status    MRSA by PCR NEGATIVE NEGATIVE Final    Comment:        The GeneXpert MRSA Assay (FDA approved for NASAL specimens only), is one component of a comprehensive MRSA colonization surveillance program. It is not intended to diagnose MRSA infection nor to guide or monitor treatment for MRSA infections.     RADIOLOGY:  Dg Ankle Complete Left  06/18/2033  CLINICAL DATA:  Pain following fall during seizure EXAM: LEFT ANKLE COMPLETE - 3+ VIEW COMPARISON:  None. FINDINGS: Frontal, oblique, and lateral views were obtained. There is soft tissue swelling. There is a fracture of the proximal aspect of the medial malleolus with mild displacement of fracture fragments. There is also a fracture of the posterior tibial metaphysis with mild displacement of fracture fragments. No other fractures are evident. The ankle mortise is grossly intact on this study. There is mild spurring along the dorsal mid to distal talus. IMPRESSION: Displaced fractures of the proximal medial malleolus and posterior distal tibial metaphysis. Ankle mortise grossly intact. Underlying osteoarthritic change. Generalized soft tissue swelling. These results will be called to the ordering clinician or representative by the Radiologist Assistant, and communication documented in the PACS or zVision Dashboard. Electronically Signed   By: Bretta Bang III M.D.   On: 06/18/2033 10:17   Ct Head Wo Contrast  06/18/2033  CLINICAL DATA:  Patient was found unresponsive. Improved with Narcan. Patient has been taking narcotics for leg fracture. EXAM: CT HEAD WITHOUT CONTRAST TECHNIQUE: Contiguous axial images were obtained from the base of the skull through the vertex without intravenous contrast. COMPARISON:  MRI brain 03/23/2015.  CT head 03/23/2015. FINDINGS: Patient is intubated. Ventricles and sulci appear symmetrical. No ventricular dilatation. No mass effect or midline shift. No abnormal extra-axial fluid collections. Gray-white matter junctions  are distinct. Basal cisterns are not effaced. No evidence of acute intracranial hemorrhage. No depressed skull fractures. Visualized paranasal sinuses and mastoid air cells are not opacified. IMPRESSION: No acute intracranial abnormalities. Electronically Signed   By: Burman Nieves M.D.   On: 06/18/2033 21:47   Ct Ankle Left Wo Contrast  06/21/2015  CLINICAL DATA:  Foot injury in a door with seizure 2 days ago. Fracture seen on radiography EXAM: CT OF THE LEFT ANKLE WITHOUT CONTRAST TECHNIQUE: Multidetector CT imaging of the left ankle was performed according to the standard protocol. Multiplanar CT image reconstructions were also generated. COMPARISON:  06/18/2033 FINDINGS: Transverse medial malleolar fracture with cortication along the fracture plane margins indicating nonunion. This is not acute. There  is a posterior malleolar fracture posterolaterally with a early cortication along the fracture plane margins and mild cortication as well as establish periostitis along the distal tibia. This fracture fragment is expected to include the attachment of the posterior distal tibiofibular ligament. There is a well corticated fracture fragment with mild comminution involving the anterolateral tibial rim, expected to include the anterior inferior tibiofibular ligament, likewise well corticated. There is some mild tibiotalar spurring along with loss of articular space in the tibiotalar joint which is notable specially posteriorly, with striking loss of cartilage thickness posteriorly. No well-defined distal fibular fracture is observed. There is some mild reactive sclerosis in the talar dome but no talar dome osteochondral lesion identified. No flexor tendon entrapment into the fracture planes is identified peritendinitis along the distal tibialis posterior. IMPRESSION: 1. 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 Similarly there appears to be  nonunion of the mildly comminuted anterolateral and posterolateral distal tibial fractures, expected to involve the attachment sites of the inferior tibiofibular ligaments and thus indicating syndesmotic injury. 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 2. Tibiotalar arthropathy including chondral thinning, mild subcortical sclerosis, and mild spurring. 3. No flexor tendon entrapment along the fracture planes observed. Electronically Signed   By: Gaylyn Rong M.D.   On: 06/21/2015 12:46   Dg Chest Port 1 View  06/18/2033  CLINICAL DATA:  Initial evaluation for unresponsiveness, status post intubation. EXAM: PORTABLE CHEST 1 VIEW COMPARISON:  None. FINDINGS: Endotracheal tube in place with tip approximately 2.7 cm 1.5 cm above the carina. Accentuation of the cardiac silhouette related AP technique, shallow lung inflation, and lordotic angulation. This also likely accounts for apparent mediastinal widening. Lungs are hypoinflated. Perihilar vascular congestion with bronchovascular crowding. There is dense opacity within the retrocardiac left lower lobe, which may reflect atelectasis or possibly infiltrate. Aspiration could be considered. More mild patchy opacity at the right lung base. No pneumothorax. No definite pleural effusion. No acute osseous abnormality. IMPRESSION: 1. Tip of endotracheal tube approximately 1.5 cm above the carina. 2. Shallow lung inflation with secondary pulmonary vascular congestion and bronchovascular crowding. Left greater than right bibasilar opacities favored to reflect atelectasis/bronchovascular crowding. Possible aspiration or infectious pneumonitis could also be considered, particularly at the retrocardiac left lower lobe. Electronically Signed   By: Rise Mu M.D.   On: 06/18/2033 21:33   Dg Abd Portable 1v  06/20/2015  CLINICAL DATA:  NG tube  placement.  Patient was found unresponsive. EXAM: PORTABLE ABDOMEN - 1 VIEW COMPARISON:  None. FINDINGS: Enteric tube tip is demonstrated in the upper mid abdomen consistent with location in the distal stomach. Scattered stool in the visualize colon. No bowel distention. IMPRESSION: Enteric tube tip is localized in the right upper quadrant consistent with location in the distal stomach. Electronically Signed   By: Burman Nieves M.D.   On: 06/20/2015 02:14   Dg Foot Complete Left  06/18/2033  CLINICAL DATA:  Pain following fall during seizure EXAM: LEFT FOOT - COMPLETE 3+ VIEW COMPARISON:  None. FINDINGS: Frontal, oblique, and lateral views were obtained. There is evidence of a slightly displaced fracture of the proximal portion of the medial malleolus. There is also a fracture of the posterior aspect of the distal tibia with mild displacement of fracture fragments. In the foot region, no fracture or dislocation is evident. Joint spaces appear intact. No erosive change. IMPRESSION: Fractures  of the proximal aspect of the medial malleolus and the posterior distal tibial metaphysis with mild displacement of fracture fragments. No fracture or dislocation in the foot region. No appreciable arthropathic change. These results will be called to the ordering clinician or representative by the Radiologist Assistant, and communication documented in the PACS or zVision Dashboard. Electronically Signed   By: Bretta Bang III M.D.   On: 06/18/2033 10:15    EKG:   Orders placed or performed during the hospital encounter of 06/19/15  . EKG 12-Lead  . EKG 12-Lead  . ED EKG  . ED EKG      Management plans discussed with the patient, family and they are in agreement.  CODE STATUS:     Code Status Orders        Start     Ordered   06/19/15 2249  Full code   Continuous     06/19/15 2249    Code Status History    Date Active Date Inactive Code Status Order ID Comments User Context   03/23/2015   1:26 PM 03/24/2015  9:51 PM Full Code [id number]  Ramonita Lab, MD Inpatient      TOTAL TIME TAKING CARE OF THIS PATIENT: 45  minutes.    @  on 06/21/2015 at 1:34 PM  Between 7am to 6pm - Pager - [phone]  After 6pm go to [email or web address] - password [email or web address] [facility]  [place] [facility]  Office  [phone]  CC: Primary care physician; No primary care provider on file.

## 2015-06-21 NOTE — Progress Notes (Signed)
MD Gouru at the bedside and asked nurse to step in the room. Pt stating he "does not want to see the orthopedic surgeon".  He states "has been dealing with this since October, and can not afford another bill". He would rather go to [facility] where he receive financial assistance there. Pt states he will think about it and [place] know what he decides. Will continue to monitor.

## 2015-06-21 NOTE — Progress Notes (Signed)
Pt being moved to room 147.  Report called to [facility], rn.  Pt is alert and oriented.

## 2015-06-21 NOTE — Consult Note (Signed)
ORTHOPAEDIC CONSULTATION  REQUESTING PHYSICIAN: Ramonita Lab, MD  Chief Complaint: Left ankle pain  HPI: Ian [name] is a 35 y.o. male who complains of  left ankle pain. He was brought in overnight after being found unresponsive by his family at home. Patient has a history of severe anxiety and depression. Patient had taken pain pills at home yesterday. Patient is brought to the [facility] ER and he was temporally intubated. Patient was then extubated and transferred to the orthopedic floor. He was complaining of left ankle pain.  Upon questioning him today the patient states he injured his left ankle initially back in October. It sounds like he was unaware that he had a fracture at that time. He has never been immobilized and has been walking on the left ankle since October. Patient complains of ankle pain and instability. These complaints are more chronic than acute however. He states that his ankle has been chronically swollen. He states it is often very large and only recently has the swelling receded. He is seen in his hospital bed with his father at the bedside. Patient is pleasant and cooperative during my interview and exam.  Past Medical History  Diagnosis Date  . Hypertension   . Hypercholesteremia   . Anxiety   . Depression    History reviewed. No pertinent past surgical history. Social History   Social History  . Marital Status: Single    Spouse Name: N/A  . Number of Children: N/A  . Years of Education: N/A   Social History Main Topics  . Smoking status: Former Smoker -- 0.75 packs/day for 16 years    Types: Cigarettes    Quit date: 04/05/2015  . Smokeless tobacco: Never Used  . Alcohol Use: No  . Drug Use: No  . Sexual Activity: No   Other Topics Concern  . None   Social History Narrative   Family History  Problem Relation Age of Onset  . Heart attack Mother   . Hypertension Mother   . Hypercholesterolemia Mother   . Heart attack  Father   . Hypertension Father   . Hypercholesterolemia Father   . Depression Maternal Uncle   . Suicidality Maternal Uncle   . Depression Cousin   . Suicidality Cousin    No Known Allergies Prior to Admission medications   Medication Sig Start Date End Date Taking? Authorizing Provider  atorvastatin (LIPITOR) 80 MG tablet Take 80 mg by mouth daily.   Yes Historical Provider, MD  citalopram (CELEXA) 40 MG tablet Take 1 tablet (40 mg total) by mouth daily. 05/05/15  Yes Kerin Salen, MD  diazepam (VALIUM) 5 MG tablet Take 1 tablet (5 mg total) by mouth every 6 (six) hours as needed for anxiety. 05/05/15  Yes Kerin Salen, MD  meloxicam (MOBIC) 15 MG tablet Take 1 tablet (15 mg total) by mouth daily. 06/19/15  Yes Hassan Rowan, MD  QUEtiapine (SEROQUEL) 400 MG tablet Take 1 tablet (400 mg total) by mouth at bedtime. 05/05/15  Yes Kerin Salen, MD  traZODone (DESYREL) 100 MG tablet Take 2 tablets (200 mg total) by mouth at bedtime as needed for sleep. 05/05/15  Yes Kerin Salen, MD   Ct Head Wo Contrast  06/19/2015  CLINICAL DATA:  Patient was found unresponsive. Improved with Narcan. Patient has been taking narcotics for leg fracture. EXAM: CT HEAD WITHOUT CONTRAST TECHNIQUE: Contiguous axial images were obtained from the base of the skull through the vertex without intravenous contrast.  COMPARISON:  MRI brain 03/23/2015.  CT head 03/23/2015. FINDINGS: Patient is intubated. Ventricles and sulci appear symmetrical. No ventricular dilatation. No mass effect or midline shift. No abnormal extra-axial fluid collections. Gray-white matter junctions are distinct. Basal cisterns are not effaced. No evidence of acute intracranial hemorrhage. No depressed skull fractures. Visualized paranasal sinuses and mastoid air cells are not opacified. IMPRESSION: No acute intracranial abnormalities. Electronically Signed   By: Burman Nieves M.D.   On: 06/19/2015 21:47   Ct Ankle Left Wo  Contrast  06/21/2015  CLINICAL DATA:  Foot injury in a door with seizure 2 days ago. Fracture seen on radiography EXAM: CT OF THE LEFT ANKLE WITHOUT CONTRAST TECHNIQUE: Multidetector CT imaging of the left ankle was performed according to the standard protocol. Multiplanar CT image reconstructions were also generated. COMPARISON:  06/19/2015 FINDINGS: Transverse medial malleolar fracture with cortication along the fracture plane margins indicating nonunion. This is not acute. There is a posterior malleolar fracture posterolaterally with a early cortication along the fracture plane margins and mild cortication as well as establish periostitis along the distal tibia. This fracture fragment is expected to include the attachment of the posterior distal tibiofibular ligament. There is a well corticated fracture fragment with mild comminution involving the anterolateral tibial rim, expected to include the anterior inferior tibiofibular ligament, likewise well corticated. There is some mild tibiotalar spurring along with loss of articular space in the tibiotalar joint which is notable specially posteriorly, with striking loss of cartilage thickness posteriorly. No well-defined distal fibular fracture is observed. There is some mild reactive sclerosis in the talar dome but no talar dome osteochondral lesion identified. No flexor tendon entrapment into the fracture planes is identified peritendinitis along the distal tibialis posterior. IMPRESSION: 1. 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 Similarly there appears to be nonunion of the mildly comminuted anterolateral and posterolateral distal tibial fractures, expected to involve the attachment sites of the inferior tibiofibular ligaments and thus indicating syndesmotic injury. The cortication of these fracture fragments indicates that these fractures did not occur 2 days ago but rather anymore chronic  setting, resulting in nonunion. 2. Tibiotalar arthropathy including chondral thinning, mild subcortical sclerosis, and mild spurring. 3. No flexor tendon entrapment along the fracture planes observed. Electronically Signed   By: Gaylyn Rong M.D.   On: 06/21/2015 12:46   Dg Chest Port 1 View  06/19/2015  CLINICAL DATA:  Initial evaluation for unresponsiveness, status post intubation. EXAM: PORTABLE CHEST 1 VIEW COMPARISON:  None. FINDINGS: Endotracheal tube in place with tip approximately 2.7 cm 1.5 cm above the carina. Accentuation of the cardiac silhouette related AP technique, shallow lung inflation, and lordotic angulation. This also likely accounts for apparent mediastinal widening. Lungs are hypoinflated. Perihilar vascular congestion with bronchovascular crowding. There is dense opacity within the retrocardiac left lower lobe, which may reflect atelectasis or possibly infiltrate. Aspiration could be considered. More mild patchy opacity at the right lung base. No pneumothorax. No definite pleural effusion. No acute osseous abnormality. IMPRESSION: 1. Tip of endotracheal tube approximately 1.5 cm above the carina. 2. Shallow lung inflation with secondary pulmonary vascular congestion and bronchovascular crowding. Left greater than right bibasilar opacities favored to reflect atelectasis/bronchovascular crowding. Possible aspiration or infectious pneumonitis could also be considered, particularly at the retrocardiac left lower lobe. Electronically Signed   By: Rise Mu M.D.   On: 06/19/2015 21:33   Dg Abd Portable 1v  06/20/2015  CLINICAL DATA:  NG tube placement.  Patient  was found unresponsive. EXAM: PORTABLE ABDOMEN - 1 VIEW COMPARISON:  None. FINDINGS: Enteric tube tip is demonstrated in the upper mid abdomen consistent with location in the distal stomach. Scattered stool in the visualize colon. No bowel distention. IMPRESSION: Enteric tube tip is localized in the right upper quadrant  consistent with location in the distal stomach. Electronically Signed   By: Burman Nieves M.D.   On: 06/20/2015 02:14    Positive ROS: All other systems have been reviewed and were otherwise negative with the exception of those mentioned in the HPI and as above.  Physical Exam: General: Alert, no acute distress  MUSCULOSKELETAL: Left ankle: Patient has mild swelling today. There is no erythema or ecchymosis. There is no obvious deformity. He has mild point tenderness over the medial malleolus and the anterior and posterior ankle. He does not have tenderness over the lateral malleolus. There is no ankle instability to drawer testing at 0 and 30 of flexion.Marland Kitchen He can dorsiflex to neutral and plantarflex to 30. He has intact sensation to light touch throughout the left foot ankle and lower extremity. He has palpable pedal pulses. He has no calf tenderness. His foot and leg compartments are soft and compressible..  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 I reviewed these results by phone with the radiologist today. Patient has nonunions of 3 fractures in the left ankle with possible syndesmotic instability. He is developing degenerative changes in the left ankle as well. I placed the patient in an AO splint and instructed him not to weight-bear on the left lower extremity. He has a walker at home and will use this for assistance with ambulation. I have also recommended that he follow up with a podiatrist or foot and ankle specialist for treatment of his nonunions. Patient will continue to elevate his left lower extremity at home. He understood and agreed with this plan.    Juanell Fairly, MD    06/21/2015 1:10 PM

## 2015-06-21 NOTE — Progress Notes (Signed)
DISCHARGE NOTE:  Pt given discharge instructions and prescription for acetaminophen.  Pt verbalized understanding. Pt discharged with splint on left ankle. Pt wheeled to car by staff.

## 2015-06-30 ENCOUNTER — Ambulatory Visit: Payer: Self-pay | Admitting: Psychiatry

## 2015-07-03 ENCOUNTER — Telehealth: Payer: Self-pay | Admitting: Psychiatry

## 2015-07-03 NOTE — Telephone Encounter (Signed)
I had been requesting that patient get metabolic labs related to his Seroquel. At his last appointment patient informed me that he had some lipid levels done at [facility]. We have obtained those labs that were from 01/28/2015. His total cholesterol was elevated at 364, triglycerides were elevated at 444 and his HDL was low at 38 his glucose was normal at 80 and his thyroid, TSH was normal at 4.27. Asthma past discussions I strongly encourage patient to obtain a primary care physician to have follow-up for issues such as this.

## 2015-09-02 ENCOUNTER — Encounter: Payer: Self-pay | Admitting: Psychiatry

## 2015-09-02 ENCOUNTER — Ambulatory Visit (INDEPENDENT_AMBULATORY_CARE_PROVIDER_SITE_OTHER): Payer: Self-pay | Admitting: Psychiatry

## 2015-09-02 VITALS — BP 138/88 | HR 107 | Temp 97.4°F | Ht 74.0 in

## 2015-09-02 DIAGNOSIS — F331 Major depressive disorder, recurrent, moderate: Secondary | ICD-10-CM

## 2015-09-02 DIAGNOSIS — F4001 Agoraphobia with panic disorder: Secondary | ICD-10-CM

## 2015-09-02 MED ORDER — QUETIAPINE FUMARATE 100 MG PO TABS: 100.0000 mg | ORAL_TABLET | Freq: Every morning | ORAL | Status: DC

## 2015-09-02 MED ORDER — CITALOPRAM HYDROBROMIDE 40 MG PO TABS: 40.0000 mg | ORAL_TABLET | Freq: Every day | ORAL | Status: DC

## 2015-09-02 MED ORDER — TRAZODONE HCL 100 MG PO TABS: 200.0000 mg | ORAL_TABLET | Freq: Every evening | ORAL | Status: DC | PRN

## 2015-09-02 MED ORDER — QUETIAPINE FUMARATE 300 MG PO TABS: 300.0000 mg | ORAL_TABLET | Freq: Every day | ORAL | Status: DC

## 2015-09-02 NOTE — Progress Notes (Signed)
Patient ID: Ian [name] [place], male   DOB: 08/16/1980, 35 y.o.   MRN: [id number] [facility] Progress [facility]  09/02/2015 9:48 AM Ian [name] [place]  MRN:  [id number]  Subjective:  Patient returns for follow-up of his major depressive disorder and anxiety. It was previously seen by Dr. Mayford KnifeWilliams and this is the first visit for this patient with this clinician. He reports doing okay . Discussed with him his overdose on benzodiazepines in January that led him to go to the emergency room and admission for acute respiratory failure. Per psychiatry consult at that time patient was misusing the Valium that led to this. Since then the notes report that his father has been giving his Valium. Discussed with patient that this clinician does not use benzodiazepines for long-term treatment of anxiety since this is no evidence for this and that addiction potential is high along with other risk factors. Patient states that he is willing to taper off the Valium. Reports that he does take the trazodone and Seroquel to help him sleep. He does endorse weight gain since his started taking the Seroquel. He is also compliant with the Celexa. He continues to have anxiety and unable to leave the house. His currently not working and living with his father. Denies any suicidal thoughts. Has not been seeing a therapist.  Chief Complaint: Doing okay Chief Complaint    Follow-up; Medication Refill    sleep Visit Diagnosis:     ICD-9-CM ICD-10-CM   1. Major depressive disorder, recurrent episode, moderate (HCC) 296.32 F33.1   2. 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 Hypertension   . Hypercholesteremia   . Anxiety   . Depression    History reviewed. No pertinent past surgical history. Family History:  Family History  Problem Relation Age of Onset  . Heart attack Mother   . Hypertension Mother   . Hypercholesterolemia Mother   .  Heart attack Father   . Hypertension Father   . Hypercholesterolemia Father   . Depression Maternal Uncle   . Suicidality Maternal Uncle   . Depression Cousin   . Suicidality Cousin    Social History:  Social History   Social History  . Marital Status: Single    Spouse Name: N/A  . Number of Children: N/A  . Years of Education: N/A   Social History Main Topics  . Smoking status: Former Smoker -- 0.75 packs/day for 16 years    Types: Cigarettes    Quit date: 04/05/2015  . Smokeless tobacco: Never Used  . Alcohol Use: No  . Drug Use: No  . Sexual Activity: No   Other Topics Concern  . None   Social History Narrative   Additional History:   Assessment:   Musculoskeletal: Strength & Muscle Tone: within normal limits Gait & Station: normal Patient leans: N/A  Psychiatric Specialty Exam: Anxiety Symptoms include nervous/anxious behavior (at baseline). Patient reports no insomnia or suicidal ideas.      Review of Systems  Psychiatric/Behavioral: Negative for depression, suicidal ideas, hallucinations, memory loss and substance abuse. The patient is nervous/anxious (at baseline). The patient does not have insomnia.   All other systems reviewed and are negative.   Blood pressure 138/88, pulse 107, temperature 97.4 F (36.3 C), temperature source Tympanic, height 6\' 2"  (1.88 m), SpO2 94 %.There is no weight on file to calculate BMI.  General Appearance: Well Groomed  Eye Contact:  Good  Speech:  Clear and Coherent and Normal Rate  Volume:  Normal  Mood:  Okay  Affect- CoOperative and pleasant   Thought Process:  Linear and Logical  Orientation:  Full (Time, Place, and Person)  Thought Content:  Negative  Suicidal Thoughts:  No  Homicidal Thoughts:  No  Memory:  Immediate;   Good Recent;   Good Remote;   Good  Judgement:  Good  Insight:  Good  Psychomotor Activity:  Negative  Concentration:  Good  Recall:  Good  Fund of Knowledge: Good  Language: Good   Akathisia:  Negative  Handed:  Rightunknown  AIMS (if indicated):  Done 09/02/2015  Assets:  Communication Skills Desire for Improvement Social Support  ADL's:  Intact  Cognition: WNL  Sleep:   good with medication   Is the patient at risk to self?  No. Has the patient been a risk to self in the past 6 months?  No. Has the patient been a risk to self within the distant past?  No. Is the patient a risk to others?  No. Has the patient been a risk to others in the past 6 months?  No. Has the patient been a risk to others within the distant past?  No.  Current Medications: Current Outpatient Prescriptions  Medication Sig Dispense Refill  . acetaminophen (TYLENOL) 325 MG tablet Take 2 tablets (650 mg total) by mouth every 6 (six) hours as needed for mild pain (or Fever >/= 101).    Marland Kitchen atorvastatin (LIPITOR) 80 MG tablet Take 80 mg by mouth daily.    . citalopram (CELEXA) 40 MG tablet Take 1 tablet (40 mg total) by mouth daily. 30 tablet 2  . diazepam (VALIUM) 5 MG tablet Take 1 tablet (5 mg total) by mouth every 6 (six) hours as needed for anxiety. 120 tablet 4  . QUEtiapine (SEROQUEL) 300 MG tablet Take 1 tablet (300 mg total) by mouth at bedtime. 30 tablet 1  . traZODone (DESYREL) 100 MG tablet Take 2 tablets (200 mg total) by mouth at bedtime as needed for sleep. 60 tablet 2  . meloxicam (MOBIC) 15 MG tablet Take 1 tablet (15 mg total) by mouth daily. (Patient not taking: Reported on 09/02/2015) 30 tablet 1  . QUEtiapine (SEROQUEL) 100 MG tablet Take 1 tablet (100 mg total) by mouth every morning. 30 tablet 1   No current facility-administered medications for this visit.    Medical Decision Making:  Established Problem, Stable/Improving (1) patient is doing well on the current regimen. His only issue right now is insomnia and a desire to adjust the sleep regimen to decrease side effects from weight gain related to Seroquel and find alternative medications to aid him in his  insomnia.  Treatment Plan Summary:Medication management   Major depressive disorder We'll continue his Celexa 40 mg daily.  Decrease Seroquel to 300 mg at bedtime Take 100 mg of Seroquel in the morning, discussed with patient that this is an effort to control his anxiety in the daytime while tapering him off the Valium. Decrease Valium to 5 mg twice daily for 1 month and then take 5 mg daily for one month and discontinue totally. She and has previous refills of Valium from Dr. Mayford Knife and this clinician has not prescribed any.  Major depressive disorder, recurrent, moderate-continue Celexa with augmentation with Seroquel. He shouldn't aware of the interactions between Celexa, Seroquel and trazodone and states that he will continue to take these and has not had any heart  problems. His aware of Q-T prolongation risk with these medications.  Panic Disorder with agoraphobia-continue Celexa and Seroquel as above.   Follow-up in 2 months .He's been encouraged call any questions or concerns prior to his next appointment.    Baker Moronta 09/02/2015, 9:48 AM

## 2015-09-08 ENCOUNTER — Ambulatory Visit: Payer: Self-pay | Admitting: Psychiatry

## 2015-11-02 ENCOUNTER — Ambulatory Visit (INDEPENDENT_AMBULATORY_CARE_PROVIDER_SITE_OTHER): Payer: Self-pay | Admitting: Psychiatry

## 2015-11-02 ENCOUNTER — Encounter: Payer: Self-pay | Admitting: Psychiatry

## 2015-11-02 VITALS — BP 144/96 | HR 120 | Temp 97.4°F | Ht 73.0 in | Wt 370.6 lb

## 2015-11-02 DIAGNOSIS — F331 Major depressive disorder, recurrent, moderate: Secondary | ICD-10-CM

## 2015-11-02 DIAGNOSIS — F4001 Agoraphobia with panic disorder: Secondary | ICD-10-CM

## 2015-11-02 MED ORDER — CITALOPRAM HYDROBROMIDE 40 MG PO TABS: 40.0000 mg | ORAL_TABLET | Freq: Every day | ORAL | Status: DC

## 2015-11-02 MED ORDER — QUETIAPINE FUMARATE 300 MG PO TABS: 300.0000 mg | ORAL_TABLET | Freq: Every day | ORAL | Status: DC

## 2015-11-02 MED ORDER — QUETIAPINE FUMARATE 100 MG PO TABS: 100.0000 mg | ORAL_TABLET | Freq: Every morning | ORAL | Status: DC

## 2015-11-02 MED ORDER — TRAZODONE HCL 100 MG PO TABS: 200.0000 mg | ORAL_TABLET | Freq: Every evening | ORAL | Status: DC | PRN

## 2015-11-02 NOTE — Progress Notes (Signed)
Patient ID: Ian [name], male   DOB: 11/15/1980, 35 y.o.   MR[facility] [id number] [facility]  11/02/2015 9:31 AM Ian [name]  MRN:  [id number]  Subjective:  Patient returns for follow-up of his major depressive disorder and anxiety. Patient reports having a lot of anxiety and some panic symptoms. Initially he talks in a very low tone and down mumbling. He then perked up after little bit and said that he continues to have anxiety and some panic symptoms. States that his been able to cut the Valium down to 5 mg daily. States his willing to go down to 2.5 mg daily and stop after a week. States that his been taking the Seroquel in the morning and it has been helping somewhat with his anxiety but it also makes him drowsy. He states he still trying to find a therapist. Sleeping okay with the help of trazodone and Seroquel. Denies any suicidal thoughts. Continues to endorse anxiety and depression.   Chief Complaint: Doing okay sleep Visit Diagnosis:     ICD-9-CM ICD-10-CM   1. Major depressive disorder, recurrent episode, moderate (HCC) 296.32 F33.1   2. 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 Hypertension   . Hypercholesteremia   . Anxiety   . Depression    No past surgical history on file. Family History:  Family History  Problem Relation Age of Onset  . Heart attack Mother   . Hypertension Mother   . Hypercholesterolemia Mother   . Heart attack Father   . Hypertension Father   . Hypercholesterolemia Father   . Depression Maternal Uncle   . Suicidality Maternal Uncle   . Depression Cousin   . Suicidality Cousin    Social History:  Social History   Social History  . Marital Status: Single    Spouse Name: N/A  . Number of Children: N/A  . Years of Education: N/A   Social History Main Topics  . Smoking status: Former Smoker -- 0.75 packs/day for 16 years    Types: Cigarettes    Quit date:  04/05/2015  . Smokeless tobacco: Never Used  . Alcohol Use: No  . Drug Use: No  . Sexual Activity: No   Other Topics Concern  . Not on file   Social History Narrative   Additional History:   Assessment:   Musculoskeletal: Strength & Muscle Tone: within normal limits Gait & Station: normal Patient leans: N/A  Psychiatric Specialty Exam: Anxiety Symptoms include nervous/anxious behavior (at baseline). Patient reports no insomnia or suicidal ideas.      Review of Systems  Psychiatric/Behavioral: Negative for depression, suicidal ideas, hallucinations, memory loss and substance abuse. The patient is nervous/anxious (at baseline). The patient does not have insomnia.   All other systems reviewed and are negative.   There were no vitals taken for this visit.There is no weight on file to calculate BMI.  General Appearance: Well Groomed  Eye Contact:  Good  Speech:  Clear and Coherent and Normal Rate  Volume:  Normal  Mood:  Okay  Affect- Restricted   Thought Process:  Linear and Logical  Orientation:  Full (Time, Place, and Person)  Thought Content:  Negative  Suicidal Thoughts:  No  Homicidal Thoughts:  No  Memory:  Immediate;   Good Recent;   Good Remote;   Good  Judgement:  Good  Insight:  Good  Psychomotor Activity:  Negative  Concentration:  Good  Recall:  Good  Fund of Knowledge: Good  Language: Good  Akathisia:  Negative  Handed:  Rightunknown  AIMS (if indicated):  Done 09/02/2015  Assets:  Communication Skills Desire for Improvement Social Support  ADL's:  Intact  Cognition: WNL  Sleep:   good with medication   Is the patient at risk to self?  No. Has the patient been a risk to self in the past 6 months?  No. Has the patient been a risk to self within the distant past?  No. Is the patient a risk to others?  No. Has the patient been a risk to others in the past 6 months?  No. Has the patient been a risk to others within the distant past?   No.  Current Medications: Current Outpatient Prescriptions  Medication Sig Dispense Refill  . acetaminophen (TYLENOL) 325 MG tablet Take 2 tablets (650 mg total) by mouth every 6 (six) hours as needed for mild pain (or Fever >/= 101).    Marland Kitchen atorvastatin (LIPITOR) 80 MG tablet Take 80 mg by mouth daily.    . citalopram (CELEXA) 40 MG tablet Take 1 tablet (40 mg total) by mouth daily. 30 tablet 2  . diazepam (VALIUM) 5 MG tablet Take 1 tablet (5 mg total) by mouth every 6 (six) hours as needed for anxiety. 120 tablet 4  . meloxicam (MOBIC) 15 MG tablet Take 1 tablet (15 mg total) by mouth daily. (Patient not taking: Reported on 09/02/2015) 30 tablet 1  . QUEtiapine (SEROQUEL) 100 MG tablet Take 1 tablet (100 mg total) by mouth every morning. 30 tablet 1  . QUEtiapine (SEROQUEL) 300 MG tablet Take 1 tablet (300 mg total) by mouth at bedtime. 30 tablet 1  . traZODone (DESYREL) 100 MG tablet Take 2 tablets (200 mg total) by mouth at bedtime as needed for sleep. 60 tablet 2   No current facility-administered medications for this visit.    Medical Decision Making:  Established Problem, Stable/Improving (1) patient is doing well on the current regimen. His only issue right now is insomnia and a desire to adjust the sleep regimen to decrease side effects from weight gain related to Seroquel and find alternative medications to aid him in his insomnia.  Treatment Plan Summary:Medication management   Major depressive disorder We'll continue his Celexa 40 mg daily.  Decrease Seroquel to 150 mg at bedtime And monitor mood. Patient willing to give this a try Take 100 mg of Seroquel in the morning, consider decreasing to 50 mg in the morning. Decrease Valium to 2.5mg  daily for  1 week and then discontinue   Major depressive disorder, recurrent, moderate-continue Celexa with augmentation with Seroquel. Patient aware of the interactions between Celexa, Seroquel and trazodone and states that he will continue  to take these and has not had any heart problems. He is aware of Q-T prolongation risk with these medications. We are trying to taper him off the Seroquel as above patient reluctant at this time but willing to give it a try  Panic Disorder with agoraphobia-continue Celexa and Seroquel as above.   Follow-up in 2 months .He's been encouraged call any questions or concerns prior to his next appointment.    Tryniti Laatsch 11/02/2015, 9:31 AM

## 2015-11-12 ENCOUNTER — Encounter: Payer: Self-pay | Admitting: *Deleted

## 2015-11-12 ENCOUNTER — Other Ambulatory Visit: Payer: Self-pay

## 2015-11-12 ENCOUNTER — Emergency Department
Admission: EM | Admit: 2015-11-12 | Discharge: 2015-11-13 | Disposition: A | Discharge status: Other Acute Inpatient Hospital | Payer: Self-pay | Attending: Emergency Medicine | Admitting: Emergency Medicine

## 2015-11-12 DIAGNOSIS — T50904A Poisoning by unspecified drugs, medicaments and biological substances, undetermined, initial encounter: Secondary | ICD-10-CM

## 2015-11-12 DIAGNOSIS — T424X1A Poisoning by benzodiazepines, accidental (unintentional), initial encounter: Secondary | ICD-10-CM | POA: Diagnosis present

## 2015-11-12 DIAGNOSIS — I10 Essential (primary) hypertension: Secondary | ICD-10-CM | POA: Insufficient documentation

## 2015-11-12 DIAGNOSIS — Z87891 Personal history of nicotine dependence: Secondary | ICD-10-CM | POA: Insufficient documentation

## 2015-11-12 DIAGNOSIS — F410 Panic disorder [episodic paroxysmal anxiety] without agoraphobia: Secondary | ICD-10-CM | POA: Diagnosis present

## 2015-11-12 DIAGNOSIS — F1320 Sedative, hypnotic or anxiolytic dependence, uncomplicated: Secondary | ICD-10-CM | POA: Diagnosis present

## 2015-11-12 DIAGNOSIS — R410 Disorientation, unspecified: Secondary | ICD-10-CM

## 2015-11-12 DIAGNOSIS — F3340 Major depressive disorder, recurrent, in remission, unspecified: Secondary | ICD-10-CM | POA: Insufficient documentation

## 2015-11-12 DIAGNOSIS — E785 Hyperlipidemia, unspecified: Secondary | ICD-10-CM | POA: Insufficient documentation

## 2015-11-12 DIAGNOSIS — T50901A Poisoning by unspecified drugs, medicaments and biological substances, accidental (unintentional), initial encounter: Secondary | ICD-10-CM | POA: Insufficient documentation

## 2015-11-12 DIAGNOSIS — F331 Major depressive disorder, recurrent, moderate: Secondary | ICD-10-CM | POA: Diagnosis present

## 2015-11-12 DIAGNOSIS — F411 Generalized anxiety disorder: Secondary | ICD-10-CM | POA: Diagnosis present

## 2015-11-12 HISTORY — DX: Panic disorder (episodic paroxysmal anxiety): F41.0

## 2015-11-12 LAB — COMPREHENSIVE METABOLIC PANEL
ALBUMIN: 4.8 g/dL (ref 3.5–5.0)
ALK PHOS: 95 U/L (ref 38–126)
ALT: 38 U/L (ref 17–63)
ANION GAP: 14 (ref 5–15)
AST: 32 U/L (ref 15–41)
BILIRUBIN TOTAL: 0.6 mg/dL (ref 0.3–1.2)
BUN: 19 mg/dL (ref 6–20)
CALCIUM: 9.7 mg/dL (ref 8.9–10.3)
CO2: 24 mmol/L (ref 22–32)
Chloride: 106 mmol/L (ref 101–111)
Creatinine, Ser: 1.54 mg/dL — ABNORMAL HIGH (ref 0.61–1.24)
GFR, EST NON AFRICAN AMERICAN: 57 mL/min — AB (ref >60)
GLUCOSE: 119 mg/dL — AB (ref 65–99)
POTASSIUM: 3.3 mmol/L — AB (ref 3.5–5.1)
Sodium: 144 mmol/L (ref 135–145)
TOTAL PROTEIN: 7.9 g/dL (ref 6.5–8.1)

## 2015-11-12 LAB — CBC
HEMATOCRIT: 42.8 % (ref 40.0–52.0)
HEMOGLOBIN: 14.7 g/dL (ref 13.0–18.0)
MCH: 31.4 pg (ref 26.0–34.0)
MCHC: 34.5 g/dL (ref 32.0–36.0)
MCV: 91.1 fL (ref 80.0–100.0)
Platelets: 290 10*3/uL (ref 150–440)
RBC: 4.7 MIL/uL (ref 4.40–5.90)
RDW: 14.5 % (ref 11.5–14.5)
WBC: 20 10*3/uL — ABNORMAL HIGH (ref 3.8–10.6)

## 2015-11-12 LAB — TROPONIN I

## 2015-11-12 LAB — SALICYLATE LEVEL: Salicylate Lvl: <4 mg/dL (ref 2.8–30.0)

## 2015-11-12 LAB — ACETAMINOPHEN LEVEL

## 2015-11-12 LAB — ETHANOL: Alcohol, Ethyl (B): <5 mg/dL (ref <5)

## 2015-11-12 MED ORDER — LORAZEPAM 2 MG/ML IJ SOLN
2.0000 mg | Freq: Once | INTRAMUSCULAR | Status: AC
  Administered 2015-11-12: 2 mg via INTRAVENOUS

## 2015-11-12 MED ORDER — LORAZEPAM 2 MG/ML IJ SOLN
INTRAMUSCULAR | Status: AC
Start: 2015-11-12 — End: 2015-11-12
  Administered 2015-11-12: 2 mg via INTRAVENOUS
  Filled 2015-11-12: qty 1

## 2015-11-12 MED ORDER — HALOPERIDOL LACTATE 5 MG/ML IJ SOLN
5.0000 mg | Freq: Once | INTRAMUSCULAR | Status: AC
  Administered 2015-11-12: 5 mg via INTRAVENOUS
  Filled 2015-11-12: qty 1

## 2015-11-12 MED ORDER — SODIUM CHLORIDE 0.9 % IV BOLUS (SEPSIS)
1000.0000 mL | Freq: Once | INTRAVENOUS | Status: AC
  Administered 2015-11-12: 1000 mL via INTRAVENOUS

## 2015-11-12 NOTE — [facility] Counselor (Signed)
Unable to obtain history from patient; will reassess once he's more coherent.

## 2015-11-12 NOTE — Consult Note (Signed)
[facility] Consult   Reason for Consult:  Consult for 35 year old man brought into the emergency room obtunded. Referring Physician:  Paduchowski Patient Identification: Ian [name] MRN:  [id number] Principal Diagnosis: Delirium Diagnosis:   Patient Active Problem List   Diagnosis Date Noted  . Delirium [R41.0] 11/12/2015  . Acute respiratory failure ([place]) [J96.00] 06/19/2015  . Benzodiazepine overdose [T42.4X1A] 06/19/2015  . 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 Generalized anxiety disorder [F41.1] 03/24/2015  . Panic disorder [F41.0] 03/24/2015  . 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 Depression, major, recurrent, in complete remission ([place]) [F33.42] 02/09/2015  . Depression, major, recurrent, moderate ([place]) [F33.1] 02/09/2015    Total Time spent with patient: 45 minutes  Subjective:   Ian [name] is a 35 y.o. male patient admitted with "I'm okay".  HPI:  Patient interviewed. Chart reviewed including old psychiatric inpatient and outpatient notes. Labs reviewed. Case discussed with emergency room physician. This 35 year old man was brought in by emergency medical services summoned by his family. Patient was unconscious and appeared to be overdosed on something. He has been medically stabilized at this point. He has woken up a little bit. Patient is still only partially coherent and I don't think his history is very believable. He tells me that he just took a single Valium as prescribed. Denies that he is used any other drugs. Pretty much every other question I ask he tells me "I'm okay". I noticed that he is continuing to follow-up with his outpatient psychiatrist who is trying to taper him down off his diazepam.  Social history: Lives with his parents. Doesn't work. Limited contact outside the home.  Medical history: Morbidly obese. History of respiratory failure.  Substance  abuse history: Multiple episodes of abuse of benzodiazepines and some possible history of abuse of pain medicine  Past Psychiatric History: Patient has had prior hospitalizations with overdose but has always insisted that it was unintentional. Has severe anxiety problems and has become dependent on Valium. I don't think he is ever had a psychiatric hospitalization.  Risk to Self:   Risk to Others:   Prior Inpatient Therapy:   Prior Outpatient Therapy:    Past Medical History:  Past Medical History  Diagnosis Date  . Hypertension   . Hypercholesteremia   . Anxiety   . Depression    History reviewed. No pertinent past surgical history. Family History:  Family History  Problem Relation Age of Onset  . Heart attack Mother   . Hypertension Mother   . Hypercholesterolemia Mother   . Heart attack Father   . Hypertension Father   . Hypercholesterolemia Father   . Depression Maternal Uncle   . Suicidality Maternal Uncle   . Depression Cousin   . Suicidality Cousin    Family Psychiatric  History: Patient is unable to give any history about this right now. Social History:  History  Alcohol Use No     History  Drug Use No    Social History   Social History  . Marital Status: Single    Spouse Name: N/A  . Number of Children: N/A  . Years of Education: N/A   Social History Main Topics  . Smoking status: Former Smoker -- 0.75 packs/day for 16 years    Types: Cigarettes    Quit date: 04/05/2015  . Smokeless tobacco: Never Used  . Alcohol Use: No  . Drug Use: No  . Sexual Activity: No  Other Topics Concern  . None   Social History Narrative   Additional Social History:    Allergies:  No Known Allergies  Labs:  Results for orders placed or performed during the hospital encounter of 11/12/15 (from the past 48 hour(s))  CBC     Status: Abnormal   Collection Time: 11/12/15  3:16 PM  Result Value Ref Range   WBC 20.0 (H) 3.8 - 10.6 K/uL   RBC 4.70 4.40 - 5.90 MIL/uL    Hemoglobin 14.7 13.0 - 18.0 g/dL   HCT 42.8 40.0 - 52.0 %   MCV 91.1 80.0 - 100.0 fL   MCH 31.4 26.0 - 34.0 pg   MCHC 34.5 32.0 - 36.0 g/dL   RDW 14.5 11.5 - 14.5 %   Platelets 290 150 - 440 K/uL  Comprehensive metabolic panel     Status: Abnormal   Collection Time: 11/12/15  3:16 PM  Result Value Ref Range   Sodium 144 135 - 145 mmol/L   Potassium 3.3 (L) 3.5 - 5.1 mmol/L   Chloride 106 101 - 111 mmol/L   CO2 24 22 - 32 mmol/L   Glucose, Bld 119 (H) 65 - 99 mg/dL   BUN 19 6 - 20 mg/dL   Creatinine, Ser 1.54 (H) 0.61 - 1.24 mg/dL   Calcium 9.7 8.9 - 10.3 mg/dL   Total Protein 7.9 6.5 - 8.1 g/dL   Albumin 4.8 3.5 - 5.0 g/dL   AST 32 15 - 41 U/L   ALT 38 17 - 63 U/L   Alkaline Phosphatase 95 38 - 126 U/L   Total Bilirubin 0.6 0.3 - 1.2 mg/dL   GFR calc non Af Amer 57 (L) >60 mL/min   GFR calc Af Amer >60 >60 mL/min    Comment: (NOTE) The eGFR has been calculated using the CKD EPI equation. This calculation has not been validated in all clinical situations. eGFR's persistently <60 mL/min signify possible Chronic Kidney Disease.    Anion gap 14 5 - 15  Troponin I     Status: None   Collection Time: 11/12/15  3:16 PM  Result Value Ref Range   Troponin I <0.03 <0.031 ng/mL    Comment:        NO INDICATION OF MYOCARDIAL INJURY.   Ethanol     Status: None   Collection Time: 11/12/15  3:16 PM  Result Value Ref Range   Alcohol, Ethyl (B) <5 <5 mg/dL    Comment:        LOWEST DETECTABLE LIMIT FOR SERUM ALCOHOL IS 5 mg/dL FOR MEDICAL PURPOSES ONLY   Acetaminophen level     Status: Abnormal   Collection Time: 11/12/15  3:16 PM  Result Value Ref Range   Acetaminophen (Tylenol), Serum <10 (L) 10 - 30 ug/mL    Comment:        THERAPEUTIC CONCENTRATIONS VARY SIGNIFICANTLY. A RANGE OF 10-30 ug/mL MAY BE AN EFFECTIVE CONCENTRATION FOR MANY PATIENTS. HOWEVER, SOME ARE BEST TREATED AT CONCENTRATIONS OUTSIDE THIS RANGE. ACETAMINOPHEN CONCENTRATIONS >150 ug/mL AT 4 HOURS  AFTER INGESTION AND >50 ug/mL AT 12 HOURS AFTER INGESTION ARE OFTEN ASSOCIATED WITH TOXIC REACTIONS.   Salicylate level     Status: None   Collection Time: 11/12/15  3:16 PM  Result Value Ref Range   Salicylate Lvl <1.6 2.8 - 30.0 mg/dL    No current facility-administered medications for this encounter.   Current Outpatient Prescriptions  Medication Sig Dispense Refill  . acetaminophen (TYLENOL) 325 MG tablet Take  2 tablets (650 mg total) by mouth every 6 (six) hours as needed for mild pain (or Fever >/= 101).    Marland Kitchen atorvastatin (LIPITOR) 80 MG tablet Take 80 mg by mouth daily.    . citalopram (CELEXA) 40 MG tablet Take 1 tablet (40 mg total) by mouth daily. 30 tablet 2  . diazepam (VALIUM) 5 MG tablet Take 1 tablet (5 mg total) by mouth every 6 (six) hours as needed for anxiety. 120 tablet 4  . meloxicam (MOBIC) 15 MG tablet Take 1 tablet (15 mg total) by mouth daily. (Patient not taking: Reported on 09/02/2015) 30 tablet 1  . QUEtiapine (SEROQUEL) 100 MG tablet Take 1 tablet (100 mg total) by mouth every morning. 30 tablet 1  . QUEtiapine (SEROQUEL) 300 MG tablet Take 1 tablet (300 mg total) by mouth at bedtime. 30 tablet 1  . traZODone (DESYREL) 100 MG tablet Take 2 tablets (200 mg total) by mouth at bedtime as needed for sleep. 60 tablet 2    Musculoskeletal: Strength & Muscle Tone: within normal limits Gait & Station: unsteady Patient leans: N/A  Psychiatric Specialty Exam: Physical Exam  Nursing note and vitals reviewed. Constitutional: He appears well-developed and well-nourished.  HENT:  Head: Normocephalic and atraumatic.  Eyes: Conjunctivae are normal. Pupils are equal, round, and reactive to light.  Neck: Normal range of motion.  Cardiovascular: Regular rhythm and normal heart sounds.   Respiratory: Effort normal. No respiratory distress.  GI: Soft.  Musculoskeletal: Normal range of motion.  Neurological: He is alert.  Skin: Skin is warm and dry.  Psychiatric:  His mood appears anxious. His affect is blunt. His speech is delayed. He is slowed and withdrawn. He expresses impulsivity. He exhibits abnormal recent memory.    Review of Systems  Unable to perform ROS: mental acuity    Blood pressure 106/50, pulse 124, resp. rate 24, weight 154.223 kg (340 lb), SpO2 93 %.Body mass index is 44.87 kg/(m^2).  General Appearance: Disheveled  Eye Contact:  Minimal  Speech:  Garbled  Volume:  Decreased  Mood:  Euthymic  Affect:  Inappropriate and Restricted  Thought Process:  Disorganized  Orientation:  Negative  Thought Content:  Illogical  Suicidal Thoughts:  No  Homicidal Thoughts:  No  Memory:  Immediate;   Poor Recent;   Fair Remote;   Poor  Judgement:  Impaired  Insight:  Lacking  Psychomotor Activity:  Decreased  Concentration:  Concentration: Poor  Recall:  Poor  Fund of Knowledge:  Poor  Language:  Fair  Akathisia:  No  Handed:  Right  AIMS (if indicated):     Assets:  Social Support  ADL's:  Impaired  Cognition:  Impaired,  Mild  Sleep:        Treatment Plan Summary: Plan 35 year old man who presented as delirious. Unclear what he has taken. Patient tells me he only took Valium and based on his prior history that would be consistent that he may have just overdosed on Valium. Drug screen not yet back. Currently he seems to be medically stabilizing but still unable to give a coherent history. Case reviewed with emergency room physician. Recommend that he be allowed to rest overnight and we will reassess in the morning. He can have when necessary Haldol for agitation if needed in the meantime.  Disposition: Supportive therapy provided about ongoing stressors.  Alethia Berthold, MD 11/12/2015 7:36 PM

## 2015-11-12 NOTE — ED Notes (Signed)
[place] [facility] by father, pt was walking around house pacing, became combative, pt will not tell staff what he took, EMS ? Bath salts pt has had previous use , pt is diaphoretic, pt is alert , cuffed by police , pt will not answer questions

## 2015-11-12 NOTE — ED Notes (Signed)
Pt. parents called for an update. Pt. verified it was okay to give update, so called back and spoke with father. [phone] with any other updates patient wants to share.

## 2015-11-12 NOTE — ED Notes (Signed)
[place] [employer] at bedside, pt left hand in cuffs to bed. CMS intact, color WNL.

## 2015-11-12 NOTE — ED Notes (Signed)
Pt will not answer as to if he took anything today. Pt diaphoretic, intermittently drifts off to sleep then awakens to be agitated.

## 2015-11-12 NOTE — ED Notes (Signed)
Pt reports to MD that he took valium today. PT continues to deny having taken any other substances.

## 2015-11-12 NOTE — ED Notes (Signed)
MD Clapacs at bedside  

## 2015-11-12 NOTE — ED Notes (Signed)
BEHAVIORAL HEALTH ROUNDING Patient sleeping: No. Patient alert and oriented: yes Behavior appropriate: Yes.  ; If no, describe:  Nutrition and fluids offered: yes Toileting and hygiene offered: Yes  Sitter present: q15 minute observations and security  monitoring Law enforcement present: Yes  ODS  

## 2015-11-12 NOTE — ED Provider Notes (Signed)
-----------------------------------------   11:18 PM on 11/12/2015 -----------------------------------------  Discussed with patient, he is sleeping but wakes up easily and is much more oriented at this time, no evidence of ongoing toxidrome. Patient states he took some Valium today. Denies SI. Denies other ingestion. He is under IVC, signed out to Dr. Zenda AlpersWebster at the end of my shift.  Jeanmarie PlantJames A Akayla Brass, MD 11/12/15 [id number]

## 2015-11-12 NOTE — ED Provider Notes (Signed)
[facility] Emergency Department Provider Note  Time seen: 1:38 PM  I have reviewed the triage vital signs and the nursing notes.   HISTORY  Chief Complaint Drug Overdose    HPI Peter Miniumaul W Sian is a 35 y.o. male with a past medical history of hypertension, hyperlipidemia, reported drug use with drug overdose in the past, who presents the emergency department for possible overdose. According to police they were called by the patient's father for possible overdose. Upon arrival they state the patient was very combative and agitated, diaphoretic. Patient will not admit to drugs or what drugs he used. The father states a history of drug abuse in the past. States a history of bath salt use in the past.Upon arrival to the emergency department the patient is very fidgety, very diaphoretic with dilated pupils, unable to answer questions. Will follow very basic commands when repeatedly asked to do so.     Past Medical History  Diagnosis Date  . Hypertension   . Hypercholesteremia   . Anxiety   . Depression     Patient Active Problem List   Diagnosis Date Noted  . Acute respiratory failure ([facility]) 06/19/2015  . Benzodiazepine overdose 06/19/2015  . Benzodiazepine abuse, episodic 03/24/2015  . Generalized anxiety disorder 03/24/2015  . Panic disorder 03/24/2015  . Alcohol abuse, in remission 03/24/2015  . 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 Depression, major, recurrent, in complete remission ([facility]) 02/09/2015  . Depression, major, recurrent, moderate ([facility]) 02/09/2015    History reviewed. No pertinent past surgical history.  Current Outpatient Rx  Name  Route  Sig  Dispense  Refill  . acetaminophen (TYLENOL) 325 MG tablet   Oral   Take 2 tablets (650 mg total) by mouth every 6 (six) hours as needed for mild pain (or Fever >/= 101).         Marland Kitchen. atorvastatin (LIPITOR) 80 MG tablet   Oral   Take 80 mg by mouth daily.         . citalopram (CELEXA)  40 MG tablet   Oral   Take 1 tablet (40 mg total) by mouth daily.   30 tablet   2   . diazepam (VALIUM) 5 MG tablet   Oral   Take 1 tablet (5 mg total) by mouth every 6 (six) hours as needed for anxiety.   120 tablet   4     FILL ON June 08, 2015   . meloxicam (MOBIC) 15 MG tablet   Oral   Take 1 tablet (15 mg total) by mouth daily. Patient not taking: Reported on 09/02/2015   30 tablet   1   . QUEtiapine (SEROQUEL) 100 MG tablet   Oral   Take 1 tablet (100 mg total) by mouth every morning.   30 tablet   1   . QUEtiapine (SEROQUEL) 300 MG tablet   Oral   Take 1 tablet (300 mg total) by mouth at bedtime.   30 tablet   1   . traZODone (DESYREL) 100 MG tablet   Oral   Take 2 tablets (200 mg total) by mouth at bedtime as needed for sleep.   60 tablet   2     Allergies Review of patient's allergies indicates no known allergies.  Family History  Problem Relation Age of Onset  . Heart attack Mother   . Hypertension Mother   . Hypercholesterolemia Mother   . Heart attack Father   . Hypertension Father   . Hypercholesterolemia  Father   . Depression Maternal Uncle   . Suicidality Maternal Uncle   . Depression Cousin   . Suicidality Cousin     Social History Social History  Substance Use Topics  . Smoking status: Former Smoker -- 0.75 packs/day for 16 years    Types: Cigarettes    Quit date: 04/05/2015  . Smokeless tobacco: Never Used  . Alcohol Use: No    Review of Systems Unable to obtain the review of systems due to altered mental status. ____________________________________________   PHYSICAL EXAM:  VITAL SIGNS: ED Triage Vitals  Enc Vitals Group     BP 11/12/15 1333 119/102 mmHg     Pulse Rate 11/12/15 1324 120     Resp 11/12/15 1324 28     Temp --      Temp src --      SpO2 11/12/15 1324 92 %     Weight 11/12/15 1324 340 lb (154.223 kg)     Height --      Head Cir --      Peak Flow --      Pain Score --      Pain Loc --      Pain  Edu? --      Excl. in GC? --     Constitutional: Alert, agitated, very fidgety requiring to be handcuffed to the bed, requiring multiple people to hold his hand to attempt to do an IV. Eyes: 5-6 millimeters, reactive bilaterally. ENT   Head: Normocephalic and atraumatic.   Mouth/Throat: Mucous membranes are moist. Cardiovascular: Regular rhythm, rate around 150. Respiratory: Normal respiratory effort with mild tachypnea. Breath sounds are clear. Gastrointestinal: Soft and nontender. No distention. Obese. Musculoskeletal: Nontender with normal range of motion in all extremities.  Neurologic:  Patient unwilling to answer questions at this time. We will follow very basic commands. Appears to be moving all extremities. Skin:  Skin is warm, extremely diaphoretic. Psychiatric: Altered mental status.  ____________________________________________    EKG  EKG reviewed and interpreted by myself appears to show sinus rhythm at 150 bpm. Nonspecific ST changes. Narrow QRS, normal QTC. A lot of little interference due to patient agitation.  Repeat EKG reviewed and interpreted by myself 13:31:15 shows sinus rhythm at 119 bpm, narrow QRS, normal axis, largely normal intervals with nonspecific ST changes.  ____________________________________________   INITIAL IMPRESSION / ASSESSMENT AND PLAN / ED COURSE  Pertinent labs & imaging results that were available during my care of the patient were reviewed by me and considered in my medical decision making (see chart for details).  Patient presents with possible drug overdose, very diaphoretic, altered, unable to answer questions. Will occasionally follow very basic commands such as opening her mouth when asked repeatedly. Patient very agitated requiring multiple people to hold his arm down to get an IV, currently handcuffed to the bed. We will dose 2 mg of Ativan IV, obtain lab work, IV hydrate.  Patient continues to be significantly agitated,  requiring 5 mg of IV Haldol.   Labs are consistent with leukocytosis of 20,000, mild acute renal insufficiency. Patient currently being IV hydrated. The patient is becoming more more responsive, able to answer more questions but still refuses to his knowledge taking any drugs, does not tinnitus, but refuses to admit anything. Patient is asking for something to eat, has been fed in the emergency department with no difficulty.  Patient has been seen by psychiatry, but the patient cannot give an appropriate history at this time. Again the  patient is able to respond much better, appears to be improving considerably clinically. Continue to highly suspect accidental overdose.  ____________________________________________   FINAL CLINICAL IMPRESSION(S) / ED DIAGNOSES  Overdose   Minna Antis, MD 11/12/15 1943

## 2015-11-12 NOTE — ED Notes (Signed)
PT attempted to provide urine sample but reports he missed the cup after urinating. New cup placed at toilet and pt verbalized he will attempt again soon.

## 2015-11-13 ENCOUNTER — Inpatient Hospital Stay
Admission: RE | Admit: 2015-11-13 | Discharge: 2015-11-15 | DRG: 882 | Disposition: A | Discharge status: Home / Self Care | Payer: Self-pay | Source: Intra-hospital | Attending: Psychiatry | Admitting: Psychiatry

## 2015-11-13 ENCOUNTER — Encounter: Payer: Self-pay | Admitting: Psychiatry

## 2015-11-13 DIAGNOSIS — Z8249 Family history of ischemic heart disease and other diseases of the circulatory system: Secondary | ICD-10-CM

## 2015-11-13 DIAGNOSIS — Z8349 Family history of other endocrine, nutritional and metabolic diseases: Secondary | ICD-10-CM

## 2015-11-13 DIAGNOSIS — T424X1A Poisoning by benzodiazepines, accidental (unintentional), initial encounter: Secondary | ICD-10-CM | POA: Diagnosis present

## 2015-11-13 DIAGNOSIS — Z79899 Other long term (current) drug therapy: Secondary | ICD-10-CM

## 2015-11-13 DIAGNOSIS — Z87891 Personal history of nicotine dependence: Secondary | ICD-10-CM

## 2015-11-13 DIAGNOSIS — R45851 Suicidal ideations: Secondary | ICD-10-CM | POA: Diagnosis present

## 2015-11-13 DIAGNOSIS — I10 Essential (primary) hypertension: Secondary | ICD-10-CM | POA: Diagnosis present

## 2015-11-13 DIAGNOSIS — G4700 Insomnia, unspecified: Secondary | ICD-10-CM | POA: Diagnosis present

## 2015-11-13 DIAGNOSIS — F1320 Sedative, hypnotic or anxiolytic dependence, uncomplicated: Secondary | ICD-10-CM | POA: Diagnosis present

## 2015-11-13 DIAGNOSIS — Z818 Family history of other mental and behavioral disorders: Secondary | ICD-10-CM

## 2015-11-13 DIAGNOSIS — F410 Panic disorder [episodic paroxysmal anxiety] without agoraphobia: Secondary | ICD-10-CM | POA: Diagnosis present

## 2015-11-13 DIAGNOSIS — F4001 Agoraphobia with panic disorder: Principal | ICD-10-CM | POA: Diagnosis present

## 2015-11-13 DIAGNOSIS — E785 Hyperlipidemia, unspecified: Secondary | ICD-10-CM | POA: Diagnosis present

## 2015-11-13 LAB — URINE DRUG SCREEN, QUALITATIVE (ARMC ONLY)
Amphetamines, Ur Screen: NOT DETECTED
BARBITURATES, UR SCREEN: NOT DETECTED
Benzodiazepine, Ur Scrn: POSITIVE — AB
CANNABINOID 50 NG, UR ~~LOC~~: NOT DETECTED
COCAINE METABOLITE, UR ~~LOC~~: NOT DETECTED
MDMA (Ecstasy)Ur Screen: NOT DETECTED
Methadone Scn, Ur: NOT DETECTED
Opiate, Ur Screen: NOT DETECTED
PHENCYCLIDINE (PCP) UR S: NOT DETECTED
TRICYCLIC, UR SCREEN: NOT DETECTED

## 2015-11-13 LAB — URINALYSIS COMPLETE WITH MICROSCOPIC (ARMC ONLY)
Glucose, UA: NEGATIVE mg/dL
HGB URINE DIPSTICK: NEGATIVE
Ketones, ur: NEGATIVE mg/dL
LEUKOCYTES UA: NEGATIVE
NITRITE: NEGATIVE
PROTEIN: 30 mg/dL — AB
WBC UA: NONE SEEN WBC/hpf (ref 0–5)
pH: 6 (ref 5.0–8.0)

## 2015-11-13 MED ORDER — ACETAMINOPHEN 325 MG PO TABS
650.0000 mg | ORAL_TABLET | Freq: Four times a day (QID) | ORAL | Status: DC | PRN
  Administered 2015-11-13 – 2015-11-15 (×4): 650 mg via ORAL
  Filled 2015-11-13 (×4): qty 2

## 2015-11-13 MED ORDER — TRAZODONE HCL 100 MG PO TABS: 200.0000 mg | ORAL_TABLET | Freq: Every evening | ORAL | Status: DC | PRN

## 2015-11-13 MED ORDER — CHLORDIAZEPOXIDE HCL 25 MG PO CAPS
25.0000 mg | ORAL_CAPSULE | Freq: Four times a day (QID) | ORAL | Status: DC
  Administered 2015-11-13 – 2015-11-15 (×6): 25 mg via ORAL
  Filled 2015-11-13 (×6): qty 1

## 2015-11-13 MED ORDER — QUETIAPINE FUMARATE 100 MG PO TABS
100.0000 mg | ORAL_TABLET | ORAL | Status: DC
  Administered 2015-11-14 – 2015-11-15 (×2): 100 mg via ORAL
  Filled 2015-11-13 (×2): qty 1

## 2015-11-13 MED ORDER — QUETIAPINE FUMARATE 200 MG PO TABS
300.0000 mg | ORAL_TABLET | Freq: Every day | ORAL | Status: DC
  Administered 2015-11-13 – 2015-11-14 (×2): 300 mg via ORAL
  Filled 2015-11-13 (×2): qty 1

## 2015-11-13 MED ORDER — NICOTINE 21 MG/24HR TD PT24
21.0000 mg | MEDICATED_PATCH | Freq: Every day | TRANSDERMAL | Status: DC
  Administered 2015-11-13: 21 mg via TRANSDERMAL
  Filled 2015-11-13: qty 1

## 2015-11-13 MED ORDER — ATORVASTATIN CALCIUM 20 MG PO TABS
80.0000 mg | ORAL_TABLET | Freq: Every day | ORAL | Status: DC
  Administered 2015-11-14: 80 mg via ORAL
  Filled 2015-11-13: qty 4

## 2015-11-13 MED ORDER — ALUM & MAG HYDROXIDE-SIMETH 200-200-20 MG/5ML PO SUSP: 30.0000 mL | ORAL | Status: DC | PRN

## 2015-11-13 MED ORDER — TRAZODONE HCL 100 MG PO TABS
200.0000 mg | ORAL_TABLET | Freq: Every evening | ORAL | Status: DC | PRN
  Administered 2015-11-13 – 2015-11-14 (×2): 200 mg via ORAL
  Filled 2015-11-13 (×2): qty 2

## 2015-11-13 MED ORDER — QUETIAPINE FUMARATE 300 MG PO TABS: 300.0000 mg | ORAL_TABLET | Freq: Every day | ORAL | Status: DC

## 2015-11-13 MED ORDER — NICOTINE 21 MG/24HR TD PT24: 21.0000 mg | MEDICATED_PATCH | Freq: Every day | TRANSDERMAL | Status: DC

## 2015-11-13 MED ORDER — QUETIAPINE FUMARATE 25 MG PO TABS: 100.0000 mg | ORAL_TABLET | ORAL | Status: DC

## 2015-11-13 MED ORDER — CITALOPRAM HYDROBROMIDE 20 MG PO TABS
40.0000 mg | ORAL_TABLET | Freq: Every day | ORAL | Status: DC
  Administered 2015-11-14 – 2015-11-15 (×2): 40 mg via ORAL
  Filled 2015-11-13 (×2): qty 2

## 2015-11-13 MED ORDER — MAGNESIUM HYDROXIDE 400 MG/5ML PO SUSP: 30.0000 mL | Freq: Every day | ORAL | Status: DC | PRN

## 2015-11-13 MED ORDER — CITALOPRAM HYDROBROMIDE 20 MG PO TABS
40.0000 mg | ORAL_TABLET | Freq: Every day | ORAL | Status: DC
  Administered 2015-11-13: 40 mg via ORAL
  Filled 2015-11-13: qty 2

## 2015-11-13 MED ORDER — ATORVASTATIN CALCIUM 20 MG PO TABS
80.0000 mg | ORAL_TABLET | Freq: Every day | ORAL | Status: DC
  Filled 2015-11-13: qty 4

## 2015-11-13 NOTE — Tx Team (Signed)
Initial Interdisciplinary Treatment Plan   PATIENT STRESSORS: Health problems Traumatic event   PATIENT STRENGTHS: Average or above average intelligence Communication skills Supportive family/friends   PROBLEM LIST: Problem List/Patient Goals Date to be addressed Date deferred Reason deferred Estimated date of resolution  Panic attack 11/13/2015     Drug overdose 11/13/2015                                                DISCHARGE CRITERIA:  Adequate post-discharge living arrangements Medical problems require only outpatient monitoring Verbal commitment to aftercare and medication compliance  PRELIMINARY DISCHARGE PLAN: Attend aftercare/continuing care group Return to previous living arrangement  PATIENT/FAMIILY INVOLVEMENT: This treatment plan has been presented to and reviewed with the patient, Ian [name], and/or family member,.  The patient and family have been given the opportunity to ask questions and make suggestions.  Margo CommonGigi George Tiny Chaudhary 11/13/2015, 6:56 PM

## 2015-11-13 NOTE — BH Assessment (Signed)
[facility] by Dr. Toni Amendlapacs.  Attending Physician will be Dr. Jennet MaduroPucilowska.   Patient has been assigned to room 309, by [facility] Charge Nurse [place]hyllis.   Intake Paper Work has been signed and placed on patient chart.  ER staff is aware of the admission Misty Stanley(Lisa, ER Sect.; Dr. Inocencio HomesGayle, ER MD; Davy PiqueLuan Patient's Nurse & Angelique Blonderenise, Patient Access).

## 2015-11-13 NOTE — ED Notes (Signed)
BEHAVIORAL HEALTH ROUNDING  Patient sleeping: Yes Patient alert and oriented: Sleeping Behavior appropriate: Yes. ; If no, describe:  Nutrition and fluids offered: No, sleeping  Toileting and hygiene offered: No, sleeping  Sitter present: q15 minute observations and security monitoring  Law enforcement present: Yes ODS 

## 2015-11-13 NOTE — ED Notes (Signed)
Called dining services [phone] for breakfast tray. 

## 2015-11-13 NOTE — ED Notes (Signed)
Night shift RN, Maralyn SagoSarah, states pt was not dressed out into psychiatric scrubs during the night.  Pt is laying in bed at this time in gym shorts.  Medic Mellody DanceKeith states he will get pt dressed into appropriate scrubs for IVC pts.  Pt calm and cooperative.

## 2015-11-13 NOTE — ED Notes (Signed)
Call to unit for transfer report.  Nurse "in med room".

## 2015-11-13 NOTE — BH Assessment (Signed)
Assessment Note  Ian [name] is an 35 y.o. male who presents to the ER due to medication overdose. He states he took and extra amount of his valium. He denies this was an intentional overdose and that he wasn't trying to end his life. Patient also denies the use of illegal drugs and alcohol. According to nurses' notes, his father reported he has history of drug use. At one point he was abusing bath salts. At the time of this interview, his UDS hasn't resulted. His BAC was negative.  Prior to coming to the ER, patient was combative, pacing and walking throughout his parent's home. Due to his behaviors, his parents called 911. When he arrived to the ER, he had to be secured to the bed.  January 2017, the patient was admitted to the medical floor, because of an unintentional overdose.  He currently receives outpatient treatment with [facility]. He's under the care of Dr. Daleen Boavi.  During the interview he was calm, cooperative and pleasant.  Patient denies SI/HI and AV/H.  Diagnosis: Depression  Past Medical History:  Past Medical History  Diagnosis Date  . Hypertension   . Hypercholesteremia   . Anxiety   . Depression     History reviewed. No pertinent past surgical history.  Family History:  Family History  Problem Relation Age of Onset  . Heart attack Mother   . Hypertension Mother   . Hypercholesterolemia Mother   . Heart attack Father   . Hypertension Father   . Hypercholesterolemia Father   . Depression Maternal Uncle   . Suicidality Maternal Uncle   . Depression Cousin   . Suicidality Cousin     Social History:  reports that he quit smoking about 7 months ago. His smoking use included Cigarettes. He has a 12 pack-year smoking history. He has never used smokeless tobacco. He reports that he does not drink alcohol or use illicit drugs.  Additional Social History:  Alcohol / Drug Use Pain Medications: See PTA Prescriptions: See PTA Over the Counter:  See PTA History of alcohol / drug use?: No history of alcohol / drug abuse Longest period of sobriety (when/how long): Denies use Negative Consequences of Use:  (Denies use) Withdrawal Symptoms:  (Denies use)  CIWA: CIWA-Ar BP: 134/86 mmHg Pulse Rate: 94 COWS:    Allergies: No Known Allergies  Home Medications:  (Not in a hospital admission)  OB/GYN Status:  No LMP for male patient.  General Assessment Data [facility] Assessment: In system Is this a Tele or Face-to-Face Assessment?: Face-to-Face Is this an Initial Assessment or a Re-assessment for this encounter?: Initial Assessment Marital status: Single Maiden name: n/a Is patient pregnant?: No Pregnancy Status: No Living Arrangements: Parent Can pt return to current living arrangement?: Yes Admission Status: Involuntary Is patient capable of signing voluntary admission?: No Referral Source: Self/Family/Friend Insurance type: None  Medical Screening Exam [facility] Walk-in ONLY) Medical Exam completed: Yes  Crisis Care Plan Living Arrangements: Parent Legal Guardian: Other: (Reports of none) Name of Psychiatrist: Dr. Daleen Boavi ([facility] ) Name of Therapist: Reports of none  Education Status Is patient currently in school?: No Current Grade: n/a Highest grade of school patient has completed: [employer] Name of school: n/a Contact person: n/a  Risk to self with the past 6 months Suicidal Ideation: No Has patient been a risk to self within the past 6 months prior to admission? : No Suicidal Intent: No Has patient had any suicidal intent  within the past 6 months prior to admission? : No Is patient at risk for suicide?: No Suicidal Plan?: No Has patient had any suicidal plan within the past 6 months prior to admission? : No Access to Means: No What has been your use of drugs/alcohol within the last 12 months?: Reports of none Previous Attempts/Gestures: No How many  times?: 0 Other Self Harm Risks: Reports of none Triggers for Past Attempts: None known Intentional Self Injurious Behavior: None Family Suicide History: No Recent stressful life event(s): Other (Comment) (Reports of none) Persecutory voices/beliefs?: No Depression: Yes Depression Symptoms: Fatigue Substance abuse history and/or treatment for substance abuse?: No Suicide prevention information given to non-admitted patients: Not applicable  Risk to Others within the past 6 months Homicidal Ideation: No Does patient have any lifetime risk of violence toward others beyond the six months prior to admission? : No Thoughts of Harm to Others: No Current Homicidal Intent: No Current Homicidal Plan: No Access to Homicidal Means: No Identified Victim: Reports of none History of harm to others?: No Assessment of Violence: None Noted Violent Behavior Description: Reports of none Does patient have access to weapons?: No Criminal Charges Pending?: No Does patient have a court date: No Is patient on probation?: No  Psychosis Hallucinations: None noted Delusions: None noted  Mental Status Report Appearance/Hygiene: In scrubs, Unremarkable, In hospital gown Eye Contact: Good Motor Activity: Freedom of movement, Unremarkable Speech: Logical/coherent, Unremarkable Level of Consciousness: Alert Mood: Pleasant Affect: Appropriate to circumstance Anxiety Level: None Thought Processes: Coherent, Relevant Judgement: Unimpaired Orientation: Person, Place, Situation, Time, Appropriate for developmental age Obsessive Compulsive Thoughts/Behaviors: Minimal  Cognitive Functioning Concentration: Normal Memory: Recent Intact, Remote Intact IQ: Average Insight: Fair Impulse Control: Poor Appetite: Good Weight Loss: 0 Weight Gain: 0 Sleep: No Change Total Hours of Sleep: 8 Vegetative Symptoms: None  ADLScreening [facility] Assessment Services) Patient's cognitive ability adequate to safely  complete daily activities?: Yes Patient able to express need for assistance with ADLs?: Yes Independently performs ADLs?: Yes (appropriate for developmental age)  Prior Inpatient Therapy Prior Inpatient Therapy: Yes Prior Therapy Dates: 01/2011 Prior Therapy Facilty/Provider(s): [facility] Reason for Treatment: Depression  Prior Outpatient Therapy Prior Outpatient Therapy: Yes Prior Therapy Dates: Current Prior Therapy Facilty/Provider(s): [facility]  Reason for Treatment: Depression Does patient have an ACCT team?: No Does patient have Intensive In-House Services?  : No Does patient have [facility] services? : No Does patient have P4CC services?: No  ADL Screening (condition at time of admission) Patient's cognitive ability adequate to safely complete daily activities?: Yes Is the patient deaf or have difficulty hearing?: No Does the patient have difficulty seeing, even when wearing glasses/contacts?: No Does the patient have difficulty concentrating, remembering, or making decisions?: No Patient able to express need for assistance with ADLs?: Yes Does the patient have difficulty dressing or bathing?: No Independently performs ADLs?: Yes (appropriate for developmental age) Does the patient have difficulty walking or climbing stairs?: No Weakness of Legs: None Weakness of Arms/Hands: None  Home Assistive Devices/Equipment Home Assistive Devices/Equipment: None  Therapy Consults (therapy consults require a physician order) PT Evaluation Needed: No OT Evalulation Needed: No SLP Evaluation Needed: No Abuse/Neglect Assessment (Assessment to be complete while patient is alone) Physical Abuse: Denies Verbal Abuse: Denies Sexual Abuse: Denies Exploitation of patient/patient's resources: Denies Self-Neglect: Denies Values / Beliefs Cultural Requests During Hospitalization: None Spiritual Requests During Hospitalization: None Consults Spiritual Care Consult  Needed: No Social Work Consult Needed: No Merchant [employer] officer (For Healthcare) Does  patient have an advance directive?: No Would patient like information on creating an advanced directive?: No - patient declined information    Additional Information 1:1 In Past 12 Months?: No CIRT Risk: No Elopement Risk: No Does patient have medical clearance?: Yes  Child/Adolescent Assessment Running Away Risk: Denies (Patient is an adult)  Disposition:  Disposition Initial Assessment Completed for this Encounter: Yes Disposition of Patient: Other dispositions (ER MD ordered Psych Consult)  On Site Evaluation by:   Reviewed with Physician:    Lilyan Gilfordalvin J. Fleur Audino MS, LCAS, LPC, NCC, CCSI Therapeutic Triage Specialist 11/13/2015 11:36 AM

## 2015-11-13 NOTE — ED Notes (Signed)
Pt given breakfast tray.  Pt alert and oriented and cooperative at this time.  Pt watching TV.  Denies SI, denies HI, denies hallucinations.

## 2015-11-13 NOTE — ED Notes (Signed)
Patient states he had "a moment of panic" and took three valium.  He denies SI.  Feels that he needs f/u with counseling sooner than his scheduled appt in 2 months.  POC discussed.  Support offered.

## 2015-11-13 NOTE — Consult Note (Signed)
[facility] Face-to-Face Psychiatry Consult   Reason for Consult:  Consult for this 35 year old man who came to the emergency room last night delirious. Reevaluated today. Referring Physician:  Paduchowski Patient Identification: Ian [name] MRN:  [id number] Principal Diagnosis: Panic disorder Diagnosis:   Patient Active Problem List   Diagnosis Date Noted  . Acute respiratory failure ([place]) [J96.00] 06/19/2015  . Benzodiazepine overdose [T42.4X1A] 06/19/2015  . 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 Generalized anxiety disorder [F41.1] 03/24/2015  . Panic disorder [F41.0] 03/24/2015  . 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 Depression, major, recurrent, in complete remission ([place]) [F33.42] 02/09/2015  . Depression, major, recurrent, moderate ([place]) [F33.1] 02/09/2015    Total Time spent with patient: 1 hour  Subjective:   Ian [name] is a 35 y.o. male patient admitted with "I just took some Valium" patient interviewed. Chart reviewed. Labs and vitals reviewed. Patient familiar to me from prior encounters as well. 35 year old man with a history of anxiety disorder who came into the hospital yesterday delirious and agitated. Required restraint transiently. By the time I spoke to him yesterday afternoon he was still delirious and confused. On interview today the patient says that all he took was Valium. He says that he thinks he took approximately 60 mg of Valium total. He did this because he was having a panic attack. He claims that he has panic attacks more than once a day so why he did take really did this yesterday he is unclear about. He says that his anxiety in general has been getting worse. Feels nervous all the time. Doesn't have anything particular that he will admit to being anxious about. When he gets panicky he gets short of breath and feels like he is having chest pain. Patient says that when he is not  having a panic attack he is not necessarily depressed. He denies any suicidal ideation. Denies that he was trying to kill himself. He denies that he's been drinking or abusing any other drugs. Patient is currently getting outpatient treatment from a psychiatrist in our clinic who has been trying to taper him down off of his benzodiazepine dose. Patient claims that he has been compliant with his other medicines but that he is having trouble doing the taper and has still been taking a larger than prescribed amount of Valium although he denies that he's buying it off the street.  Social history: Patient lives with his parents. He is not employed. Hasn't worked in 52 years. He credits this to his anxiety keeping him in the house. Seems to have a very limited social life. Never been married. Not in any kind of relationship.  Substance abuse history: Patient used to have a problem with drinking but says that he quit 2 years ago. Doesn't use any alcohol now. Denies that he is using any other drugs. He says very clearly that he can't use marijuana because the last time he tried it about 10 years ago it almost made him crazy with panic attacks. Denies using any other substances except for the Valium.  Medical history: Morbidly obese but remarkably doesn't seem to have any other major medical problems. No history of diabetes high blood pressure or heart disease.Marland Kitchen  HPI:  See note above. Patient presented with delirium last night. Today he is no longer delirious. He is alert and oriented 4 and denies suicidal ideation and is physically stable.  Past Psychiatric History: Long history of anxiety  disorder symptoms. He says he's had anxiety problems since he was a child. Things got out of control about 15 years ago. He has long been treated with benzodiazepines and has had a lot of trouble with cutting the dose down. This is the third time that I am aware of just in the last 6 months that he has presented to the hospital  having overdosed on benzodiazepines. On the 2 prior occasions he wound up in the critical care unit. Denies that he is ever tried to kill himself. Doesn't appear to of ever had a psychiatric inpatient hospitalization.  Risk to Self: Suicidal Ideation: No Suicidal Intent: No Is patient at risk for suicide?: No Suicidal Plan?: No Access to Means: No What has been your use of drugs/alcohol within the last 12 months?: Reports of none How many times?: 0 Other Self Harm Risks: Reports of none Triggers for Past Attempts: None known Intentional Self Injurious Behavior: None Risk to Others: Homicidal Ideation: No Thoughts of Harm to Others: No Current Homicidal Intent: No Current Homicidal Plan: No Access to Homicidal Means: No Identified Victim: Reports of none History of harm to others?: No Assessment of Violence: None Noted Violent Behavior Description: Reports of none Does patient have access to weapons?: No Criminal Charges Pending?: No Does patient have a court date: No Prior Inpatient Therapy: Prior Inpatient Therapy: Yes Prior Therapy Dates: 01/2011 Prior Therapy Facilty/Provider(s): [facility] Reason for Treatment: Depression Prior Outpatient Therapy: Prior Outpatient Therapy: Yes Prior Therapy Dates: Current Prior Therapy Facilty/Provider(s): [facility]  Reason for Treatment: Depression Does patient have an ACCT team?: No Does patient have Intensive In-House Services?  : No Does patient have [facility] services? : No Does patient have P4CC services?: No  Past Medical History:  Past Medical History  Diagnosis Date  . Hypertension   . Hypercholesteremia   . Anxiety   . Depression   . Panic disorder 03/24/2015   History reviewed. No pertinent past surgical history. Family History:  Family History  Problem Relation Age of Onset  . Heart attack Mother   . Hypertension Mother   . Hypercholesterolemia Mother   . Heart attack Father   . Hypertension Father    . Hypercholesterolemia Father   . Depression Maternal Uncle   . Suicidality Maternal Uncle   . Depression Cousin   . Suicidality Cousin    Family Psychiatric  History: Patient reports that he had an uncle who committed suicide and there are several other people in his family with anxiety symptoms. Social History:  History  Alcohol Use No     History  Drug Use No    Social History   Social History  . Marital Status: Single    Spouse Name: N/A  . Number of Children: N/A  . Years of Education: N/A   Social History Main Topics  . Smoking status: Former Smoker -- 0.75 packs/day for 16 years    Types: Cigarettes    Quit date: 04/05/2015  . Smokeless tobacco: Never Used  . Alcohol Use: No  . Drug Use: No  . Sexual Activity: No   Other Topics Concern  . None   Social History Narrative   Additional Social History:    Allergies:  No Known Allergies  Labs:  Results for orders placed or performed during the hospital encounter of 11/12/15 (from the past 48 hour(s))  Urine Drug Screen, Qualitative ([place] only)     Status: Abnormal   Collection Time: 11/12/15  1:25 PM  Result Value Ref Range   Tricyclic, Ur Screen NONE DETECTED NONE DETECTED   Amphetamines, Ur Screen NONE DETECTED NONE DETECTED   MDMA (Ecstasy)Ur Screen NONE DETECTED NONE DETECTED   Cocaine Metabolite,Ur [place] NONE DETECTED NONE DETECTED   Opiate, Ur Screen NONE DETECTED NONE DETECTED   Phencyclidine (PCP) Ur S NONE DETECTED NONE DETECTED   Cannabinoid 50 Ng, Ur [place] NONE DETECTED NONE DETECTED   Barbiturates, Ur Screen NONE DETECTED NONE DETECTED   Benzodiazepine, Ur Scrn POSITIVE (A) NONE DETECTED   Methadone Scn, Ur NONE DETECTED NONE DETECTED    Comment: (NOTE) 756  Tricyclics, urine               Cutoff 1000 ng/mL 200  Amphetamines, urine             Cutoff 1000 ng/mL 300  MDMA (Ecstasy), urine           Cutoff 500 ng/mL 400  Cocaine Metabolite, urine       Cutoff 300 ng/mL 500  Opiate, urine                    Cutoff 300 ng/mL 600  Phencyclidine (PCP), urine      Cutoff 25 ng/mL 700  Cannabinoid, urine              Cutoff 50 ng/mL 800  Barbiturates, urine             Cutoff 200 ng/mL 900  Benzodiazepine, urine           Cutoff 200 ng/mL 1000 Methadone, urine                Cutoff 300 ng/mL 1100 1200 The urine drug screen provides only a preliminary, unconfirmed 1300 analytical test result and should not be used for non-medical 1400 purposes. Clinical consideration and professional judgment should 1500 be applied to any positive drug screen result due to possible 1600 interfering substances. A more specific alternate chemical method 1700 must be used in order to obtain a confirmed analytical result.  1800 Gas chromato graphy / mass spectrometry (GC/MS) is the preferred 1900 confirmatory method.   CBC     Status: Abnormal   Collection Time: 11/12/15  3:16 PM  Result Value Ref Range   WBC 20.0 (H) 3.8 - 10.6 K/uL   RBC 4.70 4.40 - 5.90 MIL/uL   Hemoglobin 14.7 13.0 - 18.0 g/dL   HCT 42.8 40.0 - 52.0 %   MCV 91.1 80.0 - 100.0 fL   MCH 31.4 26.0 - 34.0 pg   MCHC 34.5 32.0 - 36.0 g/dL   RDW 14.5 11.5 - 14.5 %   Platelets 290 150 - 440 K/uL  Comprehensive metabolic panel     Status: Abnormal   Collection Time: 11/12/15  3:16 PM  Result Value Ref Range   Sodium 144 135 - 145 mmol/L   Potassium 3.3 (L) 3.5 - 5.1 mmol/L   Chloride 106 101 - 111 mmol/L   CO2 24 22 - 32 mmol/L   Glucose, Bld 119 (H) 65 - 99 mg/dL   BUN 19 6 - 20 mg/dL   Creatinine, Ser 1.54 (H) 0.61 - 1.24 mg/dL   Calcium 9.7 8.9 - 10.3 mg/dL   Total Protein 7.9 6.5 - 8.1 g/dL   Albumin 4.8 3.5 - 5.0 g/dL   AST 32 15 - 41 U/L   ALT 38 17 - 63 U/L   Alkaline Phosphatase 95 38 - 126 U/L   Total  Bilirubin 0.6 0.3 - 1.2 mg/dL   GFR calc non Af Amer 57 (L) >60 mL/min   GFR calc Af Amer >60 >60 mL/min    Comment: (NOTE) The eGFR has been calculated using the CKD EPI equation. This calculation has not been validated  in all clinical situations. eGFR's persistently <60 mL/min signify possible Chronic Kidney Disease.    Anion gap 14 5 - 15  Troponin I     Status: None   Collection Time: 11/12/15  3:16 PM  Result Value Ref Range   Troponin I <0.03 <0.031 ng/mL    Comment:        NO INDICATION OF MYOCARDIAL INJURY.   Ethanol     Status: None   Collection Time: 11/12/15  3:16 PM  Result Value Ref Range   Alcohol, Ethyl (B) <5 <5 mg/dL    Comment:        LOWEST DETECTABLE LIMIT FOR SERUM ALCOHOL IS 5 mg/dL FOR MEDICAL PURPOSES ONLY   Acetaminophen level     Status: Abnormal   Collection Time: 11/12/15  3:16 PM  Result Value Ref Range   Acetaminophen (Tylenol), Serum <10 (L) 10 - 30 ug/mL    Comment:        THERAPEUTIC CONCENTRATIONS VARY SIGNIFICANTLY. A RANGE OF 10-30 ug/mL MAY BE AN EFFECTIVE CONCENTRATION FOR MANY PATIENTS. HOWEVER, SOME ARE BEST TREATED AT CONCENTRATIONS OUTSIDE THIS RANGE. ACETAMINOPHEN CONCENTRATIONS >150 ug/mL AT 4 HOURS AFTER INGESTION AND >50 ug/mL AT 12 HOURS AFTER INGESTION ARE OFTEN ASSOCIATED WITH TOXIC REACTIONS.   Salicylate level     Status: None   Collection Time: 11/12/15  3:16 PM  Result Value Ref Range   Salicylate Lvl <0.0 2.8 - 30.0 mg/dL    Current Facility-Administered Medications  Medication Dose Route Frequency Provider Last Rate Last Dose  . nicotine (NICODERM CQ - dosed in mg/24 hours) patch 21 mg  21 mg Transdermal Daily Gonzella Lex, MD       Current Outpatient Prescriptions  Medication Sig Dispense Refill  . acetaminophen (TYLENOL) 325 MG tablet Take 2 tablets (650 mg total) by mouth every 6 (six) hours as needed for mild pain (or Fever >/= 101).    Marland Kitchen atorvastatin (LIPITOR) 80 MG tablet Take 80 mg by mouth daily.    . citalopram (CELEXA) 40 MG tablet Take 1 tablet (40 mg total) by mouth daily. 30 tablet 2  . diazepam (VALIUM) 5 MG tablet Take 1 tablet (5 mg total) by mouth every 6 (six) hours as needed for anxiety. 120 tablet 4    . meloxicam (MOBIC) 15 MG tablet Take 1 tablet (15 mg total) by mouth daily. (Patient not taking: Reported on 09/02/2015) 30 tablet 1  . QUEtiapine (SEROQUEL) 100 MG tablet Take 1 tablet (100 mg total) by mouth every morning. 30 tablet 1  . QUEtiapine (SEROQUEL) 300 MG tablet Take 1 tablet (300 mg total) by mouth at bedtime. 30 tablet 1  . traZODone (DESYREL) 100 MG tablet Take 2 tablets (200 mg total) by mouth at bedtime as needed for sleep. 60 tablet 2    Musculoskeletal: Strength & Muscle Tone: within normal limits Gait & Station: normal Patient leans: N/A  Psychiatric Specialty Exam: Physical Exam  Nursing note and vitals reviewed. Constitutional: He appears well-developed and well-nourished.    HENT:  Head: Normocephalic and atraumatic.  Eyes: Conjunctivae are normal. Pupils are equal, round, and reactive to light.  Neck: Normal range of motion.  Cardiovascular: Normal heart sounds.  Respiratory: Effort normal.  GI: Soft.  Musculoskeletal: Normal range of motion.  Neurological: He is alert.  Skin: Skin is warm and dry.  Psychiatric: His mood appears anxious. His speech is rapid and/or pressured and tangential. He is agitated. He expresses impulsivity. He expresses no suicidal ideation. He exhibits abnormal recent memory.    Review of Systems  Constitutional: Negative.   HENT: Negative.   Eyes: Negative.   Respiratory: Negative.   Cardiovascular: Negative.   Gastrointestinal: Negative.   Musculoskeletal: Negative.   Skin: Negative.   Neurological: Negative.   Psychiatric/Behavioral: Positive for memory loss and substance abuse. Negative for depression, suicidal ideas and hallucinations. The patient is nervous/anxious. The patient does not have insomnia.     Blood pressure 134/86, pulse 94, temperature 98.1 F (36.7 C), temperature source Oral, resp. rate 20, weight 154.223 kg (340 lb), SpO2 97 %.Body mass index is 44.87 kg/(m^2).  General Appearance: Disheveled  Eye  Contact:  Fair  Speech:  Clear and Coherent  Volume:  Increased  Mood:  Dysphoric  Affect:  Constricted  Thought Process:  Goal Directed  Orientation:  Full (Time, Place, and Person)  Thought Content:  Rumination  Suicidal Thoughts:  No  Homicidal Thoughts:  No  Memory:  Immediate;   Fair Recent;   Poor Remote;   Fair  Judgement:  Impaired  Insight:  Shallow  Psychomotor Activity:  Decreased  Concentration:  Concentration: Fair  Recall:  Poor  Fund of Knowledge:  Fair  Language:  Fair  Akathisia:  No  Handed:  Right  AIMS (if indicated):     Assets:  Desire for Improvement Housing Physical Health Social Support  ADL's:  Intact  Cognition:  WNL  Sleep:        Treatment Plan Summary: Daily contact with patient to assess and evaluate symptoms and progress in treatment, Medication management and Plan 35 year old man who presented delirious last night. It's hard to believe that that was all a result of a benzodiazepine overdose but we don't have any evidence to the contrary. This is at least the third time that he has done this. Despite good outpatient treatment things do not seem to be getting any better. Although he is currently not showing any psychosis or delirium and he denies any suicidal ideation I think the safer thing to do is to admit him to the psychiatry ward. We can hope to taper him off his benzodiazepines and see if he can be stabilized a little bit prior to discharge. Patient took this relatively well and understands the rationale. Continue outpatient medicine. Supportive counseling completed. Labs reviewed.  Disposition: Recommend psychiatric Inpatient admission when medically cleared. Supportive therapy provided about ongoing stressors.  Alethia Berthold, MD 11/13/2015 2:18 PM

## 2015-11-13 NOTE — Progress Notes (Signed)
Patient pleasant and cooperative during admission assessment. Patient denies SI/HI at this time. Patient denies AVH. Patient informed of fall risk status, fall risk assessed "low" at this time. Patient oriented to unit/staff/room. Patient denies any questions/concerns at this time. Patient safe on unit with Q15 minute checks for safety. Skin assessment & body search done.No contraband found. 

## 2015-11-13 NOTE — ED Notes (Signed)
Parents number is [phone]

## 2015-11-13 NOTE — ED Notes (Signed)
Patient was changed out to paper scrub pants. There are no scrub shirts that will fit.

## 2015-11-14 DIAGNOSIS — F4001 Agoraphobia with panic disorder: Principal | ICD-10-CM

## 2015-11-14 MED ORDER — NICOTINE 21 MG/24HR TD PT24
21.0000 mg | MEDICATED_PATCH | Freq: Every day | TRANSDERMAL | Status: DC
  Administered 2015-11-14 – 2015-11-15 (×2): 21 mg via TRANSDERMAL
  Filled 2015-11-14 (×3): qty 1

## 2015-11-14 NOTE — Progress Notes (Signed)
D: Patient appears somewhat anxious but very pleasant. States he's here because he took too much valium but that it wasn't a SA. Denies SI/HI/AVH at this time. Does report pain though in his lower back. He's been visible in the milieu interacting with other patients.  A: Medication was given with education. Encouragement was provided. PRN tylenol was given.  R: Patient was compliant with medication. He has remained calm and cooperative. Safety maintained with 15 min checks.

## 2015-11-14 NOTE — Plan of Care (Signed)
Problem: Safety: Goal: Periods of time without injury will increase Outcome: Progressing Patient has remained free from injury during this shift.    

## 2015-11-14 NOTE — [facility] Suicide Risk Assessment (Addendum)
[facility]   Nursing information obtained from:    Demographic factors:    Current Mental Status:    Loss Factors:    Historical Factors:    Risk Reduction Factors:     Total Time spent with patient: 1 hour Principal Problem: Agoraphobia with panic disorder Diagnosis:   Patient Active Problem List   Diagnosis Date Noted  . Dyslipidemia [E78.5] 11/13/2015  . Benzodiazepine overdose [T42.4X1A] 06/19/2015  . Sedative, hypnotic or anxiolytic use disorder, severe, dependence (HCC) [F13.20] 03/24/2015  . Generalized anxiety disorder [F41.1] 03/24/2015  . Panic disorder [F41.0] 03/24/2015  . 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 Depression, major, recurrent, moderate (HCC) [F33.1] 02/09/2015   Subjective Data: Panic attacks, medication overdose.  Continued Clinical Symptoms:  Alcohol Use Disorder Identification Test Final Score (AUDIT): 0 The "Alcohol Use Disorders Identification Test", Guidelines for Use in Primary Care, Second Edition.  World Science writerHealth Organization [facility](WHO). Score between 0-7:  no or low risk or alcohol related problems. Score between 8-15:  moderate risk of alcohol related problems. Score between 16-19:  high risk of alcohol related problems. Score 20 or above:  warrants further diagnostic evaluation for alcohol dependence and treatment.   CLINICAL FACTORS:   Severe Anxiety and/or Agitation Panic Attacks   Musculoskeletal: Strength & Muscle Tone: within normal limits Gait & Station: normal Patient leans: N/A  Psychiatric Specialty Exam: Physical Exam  Nursing note and vitals reviewed.   Review of Systems  Psychiatric/Behavioral: Positive for suicidal ideas. The patient is nervous/anxious and has insomnia.   All other systems reviewed and are negative.   Blood pressure 134/86, pulse  103, temperature 97.7 F (36.5 C), temperature source Oral, resp. rate 18, height 6\' 1"  (1.854 m), weight 168.738 kg (372 lb), SpO2 97 %.Body mass index is 49.09 kg/(m^2).  General Appearance: Casual  Eye Contact:  Good  Speech:  Clear and Coherent  Volume:  Normal  Mood:  Anxious  Affect:  Appropriate  Thought Process:  Goal Directed  Orientation:  Full (Time, Place, and Person)  Thought Content:  WDL  Suicidal Thoughts:  No  Homicidal Thoughts:  No  Memory:  Immediate;   Fair Recent;   Fair Remote;   Fair  Judgement:  Poor  Insight:  Fair  Psychomotor Activity:  Normal  Concentration:  Concentration: Fair and Attention Span: Fair  Recall:  [employer]  Fund of Knowledge:  Fair  Language:  Fair  Akathisia:  No  Handed:  Right  AIMS (if indicated):     Assets:  Communication Skills Desire for Improvement Financial Resources/Insurance Housing Physical Health Resilience Social Support  ADL's:  Intact  Cognition:  WNL  Sleep:  Number of Hours: 6.75      COGNITIVE FEATURES THAT CONTRIBUTE TO RISK:  None    SUICIDE RISK:   Moderate:  Frequent suicidal ideation with limited intensity, and duration, some specificity in terms of plans, no associated intent, good self-control, limited dysphoria/symptomatology, some risk factors present, and identifiable protective factors, including available and accessible social support.  PLAN OF CARE: Hospital admission, medication management, substance abuse counseling, discharge planning.  Mr. Gwinda PasseGilliam is a 35 year old male of severe anxiety admitted after overdose on Valium.  1. Suicidal ideation. The patient is able to contract for safety in the hospital.  2. Mood. He has been maintained on Celexa and Seroquel with excellent results.  3. Anxiety. He has been maintained on Valium by his previous psychiatrist. His current provider is tapering him off. He will no longer be prescribing Valium in her office. I offered him brief Librium  taper.  4. Insomnia. He is on trazodone.  5. Dyslipidemia. He is on Lipitor.  6. Smoking. Nicotine patch is available.  7. Disposition. He will be discharged to home with his family. He will follow up with Dr. Daleen Boavi.  I certify that inpatient services furnished can reasonably be expected to improve the patient's condition.   Kristine LineaJolanta Dilraj Killgore, MD 11/14/2015, 2:15 PM

## 2015-11-14 NOTE — H&P (Signed)
Psychiatric Admission Assessment Adult  Patient Identification: Ian [name] MRN:  [id number] Date of Evaluation:  11/14/2015 Chief Complaint:  Depression Principal Diagnosis: Agoraphobia with panic disorder Diagnosis:   Patient Active Problem List   Diagnosis Date Noted  . Dyslipidemia [E78.5] 11/13/2015  . Benzodiazepine overdose [T42.4X1A] 06/19/2015  . Sedative, hypnotic or anxiolytic use disorder, severe, dependence ([place]) [F13.20] 03/24/2015  . Generalized anxiety disorder [F41.1] 03/24/2015  . Panic disorder [F41.0] 03/24/2015  . 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 Depression, major, recurrent, in complete remission ([place]) [F33.42] 02/09/2015  . Depression, major, recurrent, moderate ([place]) [F33.1] 02/09/2015   History of Present Illness:   Identifying data. Ian [name] is a 35 year old male with a history of depression and severe anxiety.  Chief complaint. "I had a bad panic attack."  History of present illness. Information was obtained from the patient and the chart. The patient has a long history of depression and anxiety. With treatment his depression resolved completely he however still suffers some level of daily anxiety compounded by frequent panic attacks. He has daily "small" panic attacks and at least 2 "big" once every week. Dr. Einar Grad who is his primary psychiatrist has been tapering him off Valium. On the day of admission he had a major panic attack and took too many Valiums. His family noticed that he was sedated and brought him to the hospital. He denies that he overdosed in a suicide attempt rather he was trying to deal with his anxiety. She denies any symptoms of depression or psychosis. He denies symptoms suggestive of bipolar mania. He denies other forms of anxiety like PTSD or OCD. He denies alcohol or illicit substance use.  Past psychiatric history. He had one overdose on Valium in the past. He  denies that it was a suicide attempt rather he was taking it to feel better. He was never in the hospital. He is now in the process of tapering off Valium.  Family psychiatric history. None reported.  Social history. He lives with his parents and his homebound. He tells me that he doesn't Get out of the house or meet people. He is afraid that he may have ADD panic attack in public.  Total Time spent with patient: 1 hour  Is the patient at risk to self? No.  Has the patient been a risk to self in the past 6 months? No.  Has the patient been a risk to self within the distant past? No.  Is the patient a risk to others? No.  Has the patient been a risk to others in the past 6 months? No.  Has the patient been a risk to others within the distant past? No.   Prior Inpatient Therapy:   Prior Outpatient Therapy:    Alcohol Screening: 1. How often do you have a drink containing alcohol?: Never 2. How many drinks containing alcohol do you have on a typical day when you are drinking?: 1 or 2 3. How often do you have six or more drinks on one occasion?: Never Preliminary Score: 0 4. How often during the last year have you found that you were not able to stop drinking once you had started?: Never 5. How often during the last year have you failed to do what was normally expected from you becasue of drinking?: Never 6. How often during the last year have you needed a first drink in the morning to get yourself going after a heavy  drinking session?: Never 7. How often during the last year have you had a feeling of guilt of remorse after drinking?: Never 8. How often during the last year have you been unable to remember what happened the night before because you had been drinking?: Never 9. Have you or someone else been injured as a result of your drinking?: No 10. Has a relative or friend or a doctor or another health worker been concerned about your drinking or suggested you cut down?: No Alcohol Use  Disorder Identification Test Final Score (AUDIT): 0 Brief Intervention: AUDIT score less than 7 or less-screening does not suggest unhealthy drinking-brief intervention not indicated Substance Abuse History in the last 12 months:  No. Consequences of Substance Abuse: NA Previous Psychotropic Medications: No  Psychological Evaluations: No  Past Medical History:  Past Medical History  Diagnosis Date  . Hypertension   . Hypercholesteremia   . Anxiety   . Depression   . Panic disorder 03/24/2015   History reviewed. No pertinent past surgical history. Family History:  Family History  Problem Relation Age of Onset  . Heart attack Mother   . Hypertension Mother   . Hypercholesterolemia Mother   . Heart attack Father   . Hypertension Father   . Hypercholesterolemia Father   . Depression Maternal Uncle   . Suicidality Maternal Uncle   . Depression Cousin   . Suicidality Cousin     Tobacco Screening: @FLOW [phone])::1)@ Social History:  History  Alcohol Use No     History  Drug Use No    Additional Social History:      History of alcohol / drug use?: No history of alcohol / drug abuse                    Allergies:  No Known Allergies Lab Results:  Results for orders placed or performed during the hospital encounter of 11/12/15 (from the past 48 hour(s))  CBC     Status: Abnormal   Collection Time: 11/12/15  3:16 PM  Result Value Ref Range   WBC 20.0 (H) 3.8 - 10.6 K/uL   RBC 4.70 4.40 - 5.90 MIL/uL   Hemoglobin 14.7 13.0 - 18.0 g/dL   HCT 42.8 40.0 - 52.0 %   MCV 91.1 80.0 - 100.0 fL   MCH 31.4 26.0 - 34.0 pg   MCHC 34.5 32.0 - 36.0 g/dL   RDW 14.5 11.5 - 14.5 %   Platelets 290 150 - 440 K/uL  Comprehensive metabolic panel     Status: Abnormal   Collection Time: 11/12/15  3:16 PM  Result Value Ref Range   Sodium 144 135 - 145 mmol/L   Potassium 3.3 (L) 3.5 - 5.1 mmol/L   Chloride 106 101 - 111 mmol/L   CO2 24 22 - 32 mmol/L   Glucose, Bld 119 (H) 65  - 99 mg/dL   BUN 19 6 - 20 mg/dL   Creatinine, Ser 1.54 (H) 0.61 - 1.24 mg/dL   Calcium 9.7 8.9 - 10.3 mg/dL   Total Protein 7.9 6.5 - 8.1 g/dL   Albumin 4.8 3.5 - 5.0 g/dL   AST 32 15 - 41 U/L   ALT 38 17 - 63 U/L   Alkaline Phosphatase 95 38 - 126 U/L   Total Bilirubin 0.6 0.3 - 1.2 mg/dL   GFR calc non Af Amer 57 (L) >60 mL/min   GFR calc Af Amer >60 >60 mL/min    Comment: (NOTE) The eGFR has been calculated  using the CKD EPI equation. This calculation has not been validated in all clinical situations. eGFR's persistently <60 mL/min signify possible Chronic Kidney Disease.    Anion gap 14 5 - 15  Troponin I     Status: None   Collection Time: 11/12/15  3:16 PM  Result Value Ref Range   Troponin I <0.03 <0.031 ng/mL    Comment:        NO INDICATION OF MYOCARDIAL INJURY.   Ethanol     Status: None   Collection Time: 11/12/15  3:16 PM  Result Value Ref Range   Alcohol, Ethyl (B) <5 <5 mg/dL    Comment:        LOWEST DETECTABLE LIMIT FOR SERUM ALCOHOL IS 5 mg/dL FOR MEDICAL PURPOSES ONLY   Acetaminophen level     Status: Abnormal   Collection Time: 11/12/15  3:16 PM  Result Value Ref Range   Acetaminophen (Tylenol), Serum <10 (L) 10 - 30 ug/mL    Comment:        THERAPEUTIC CONCENTRATIONS VARY SIGNIFICANTLY. A RANGE OF 10-30 ug/mL MAY BE AN EFFECTIVE CONCENTRATION FOR MANY PATIENTS. HOWEVER, SOME ARE BEST TREATED AT CONCENTRATIONS OUTSIDE THIS RANGE. ACETAMINOPHEN CONCENTRATIONS >150 ug/mL AT 4 HOURS AFTER INGESTION AND >50 ug/mL AT 12 HOURS AFTER INGESTION ARE OFTEN ASSOCIATED WITH TOXIC REACTIONS.   Salicylate level     Status: None   Collection Time: 11/12/15  3:16 PM  Result Value Ref Range   Salicylate Lvl <2.3 2.8 - 30.0 mg/dL    Blood Alcohol level:  Lab Results  Component Value Date   ETH <5 11/12/2015   ETH <5 30/11/6224    Metabolic Disorder Labs:  No results found for: HGBA1C, MPG Lab Results  Component Value Date   PROLACTIN 8.4  03/23/2015   Lab Results  Component Value Date   TRIG 271* 06/20/2015    Current Medications: Current Facility-Administered Medications  Medication Dose Route Frequency Provider Last Rate Last Dose  . acetaminophen (TYLENOL) tablet 650 mg  650 mg Oral Q6H PRN Gonzella Lex, MD   650 mg at 11/14/15 3335  . alum & mag hydroxide-simeth (MAALOX/MYLANTA) 200-200-20 MG/5ML suspension 30 mL  30 mL Oral Q4H PRN Gonzella Lex, MD      . atorvastatin (LIPITOR) tablet 80 mg  80 mg Oral q1800 Gonzella Lex, MD      . chlordiazePOXIDE (LIBRIUM) capsule 25 mg  25 mg Oral QID Clovis Fredrickson, MD   25 mg at 11/14/15 0908  . citalopram (CELEXA) tablet 40 mg  40 mg Oral Daily Gonzella Lex, MD   40 mg at 11/14/15 0908  . magnesium hydroxide (MILK OF MAGNESIA) suspension 30 mL  30 mL Oral Daily PRN Gonzella Lex, MD      . nicotine (NICODERM CQ - dosed in mg/24 hours) patch 21 mg  21 mg Transdermal Daily Layci Stenglein B Alizae Bechtel, MD   21 mg at 11/14/15 0949  . QUEtiapine (SEROQUEL) tablet 100 mg  100 mg Oral BH-q7a Gonzella Lex, MD   100 mg at 11/14/15 0908  . QUEtiapine (SEROQUEL) tablet 300 mg  300 mg Oral QHS Gonzella Lex, MD   300 mg at 11/13/15 2200  . traZODone (DESYREL) tablet 200 mg  200 mg Oral QHS PRN Gonzella Lex, MD   200 mg at 11/13/15 2202   PTA Medications: Prescriptions prior to admission  Medication Sig Dispense Refill Last Dose  . acetaminophen (TYLENOL) 325 MG tablet Take  2 tablets (650 mg total) by mouth every 6 (six) hours as needed for mild pain (or Fever >/= 101).   Taking  . atorvastatin (LIPITOR) 80 MG tablet Take 80 mg by mouth daily.   Taking  . citalopram (CELEXA) 40 MG tablet Take 1 tablet (40 mg total) by mouth daily. 30 tablet 2   . diazepam (VALIUM) 5 MG tablet Take 1 tablet (5 mg total) by mouth every 6 (six) hours as needed for anxiety. 120 tablet 4 Taking  . meloxicam (MOBIC) 15 MG tablet Take 1 tablet (15 mg total) by mouth daily. (Patient not taking: Reported  on 09/02/2015) 30 tablet 1 Not Taking  . QUEtiapine (SEROQUEL) 100 MG tablet Take 1 tablet (100 mg total) by mouth every morning. 30 tablet 1   . QUEtiapine (SEROQUEL) 300 MG tablet Take 1 tablet (300 mg total) by mouth at bedtime. 30 tablet 1   . traZODone (DESYREL) 100 MG tablet Take 2 tablets (200 mg total) by mouth at bedtime as needed for sleep. 60 tablet 2     Musculoskeletal: Strength & Muscle Tone: within normal limits Gait & Station: normal Patient leans: N/A  Psychiatric Specialty Exam: Physical Exam  Nursing note and vitals reviewed. Constitutional: He is oriented to person, place, and time. He appears well-developed and well-nourished.  HENT:  Head: Normocephalic and atraumatic.  Eyes: Conjunctivae and EOM are normal. Pupils are equal, round, and reactive to light.  Neck: Normal range of motion. Neck supple.  Cardiovascular: Normal rate, regular rhythm and normal heart sounds.   Respiratory: Effort normal and breath sounds normal.  GI: Soft. Bowel sounds are normal.  Musculoskeletal: Normal range of motion.  Neurological: He is alert and oriented to person, place, and time.  Skin: Skin is warm and dry.    Review of Systems  Psychiatric/Behavioral: The patient is nervous/anxious and has insomnia.   All other systems reviewed and are negative.   Blood pressure 134/86, pulse 103, temperature 97.7 F (36.5 C), temperature source Oral, resp. rate 18, height 6' 1"  (1.854 m), weight 168.738 kg (372 lb), SpO2 97 %.Body mass index is 49.09 kg/(m^2).  See SRA.                                                  Sleep:  Number of Hours: 6.75       Treatment Plan Summary: Daily contact with patient to assess and evaluate symptoms and progress in treatment and Medication management   Ian [name] is a 35 year old male of severe anxiety admitted after overdose on Valium.  1. Suicidal ideation. The patient is able to contract for safety in the  hospital.  2. Mood. He has been maintained on Celexa and Seroquel with excellent results.  3. Anxiety. He has been maintained on Valium by his previous psychiatrist. His current provider is tapering him off. He will no longer be prescribing Valium in her office. I offered him brief Librium taper.  4. Insomnia. He is on trazodone.  5. Dyslipidemia. He is on Lipitor.  6. Smoking. Nicotine patch is available.  7. Metabolic syndrome screening. Lipid panel, hemoglobin A1c, TSH and prolactin are pending.   8. Disposition. He will be discharged to home with his family. He will follow up with Dr. Einar Grad.    Observation Level/Precautions:  15 minute checks  Laboratory:  CBC Chemistry  Profile UDS UA  Psychotherapy:    Medications:    Consultations:    Discharge Concerns:    Estimated LOS:  Other:     I certify that inpatient services furnished can reasonably be expected to improve the patient's condition.    Orson Slick, MD 6/24/20172:37 PM

## 2015-11-14 NOTE — Progress Notes (Signed)
Patient pleasant and cooperative. He attended groups and was seen interacting with peers appropriately. He denied SI/AVH and contracted for safety. Medication education and co[ping skills discussed with patient and he verbalized understanding. Will continue to monitor to ensure safety.

## 2015-11-14 NOTE — [facility] Group Notes (Signed)
[facility] Group Notes:  (Nursing/MHT/Case Management/Adjunct)  Date:  11/14/2015  Time:  3:13 AM  Type of Therapy:  Group Therapy  Participation Level:  Active  Participation Quality:  Appropriate  Affect:  Appropriate  Cognitive:  Appropriate  Insight:  Appropriate  Engagement in Group:  Engaged  Modes of Intervention:  n/a  Summary of Progress/Problems:   Ian [name] 11/14/2015, 3:13 AM

## 2015-11-14 NOTE — [facility] Group Notes (Signed)
[facility] LCSW Group Therapy  11/14/2015 3:12 PM  Type of Therapy:  Group Therapy  Participation Level:  Minimal  Participation Quality:  Attentive  Affect:  Appropriate  Cognitive:  Alert  Insight:  Improving  Engagement in Therapy:  Lacking  Modes of Intervention:  Discussion, Education, Socialization and Support  Summary of Progress/Problems: Pt will identify unhealthy thoughts and how they impact their emotions and behavior. Pt will be encouraged to discuss these thoughts, emotions and behaviors with the group. Pt attended group and stayed the entire time. Pt sat quietly and listened to other group members share.   Ian [name] L Katiya Fike MSW, LCSWA  11/14/2015, 3:12 PM

## 2015-11-14 NOTE — Plan of Care (Signed)
Problem: Activity: Goal: Interest or engagement in activities will improve Outcome: Progressing Patient attended groups and interacted with peers appropriately.

## 2015-11-15 LAB — LIPID PANEL
CHOLESTEROL: 201 mg/dL — AB (ref 0–200)
HDL: 33 mg/dL — AB (ref >40)
LDL CALC: 122 mg/dL — AB (ref 0–99)
TRIGLYCERIDES: 230 mg/dL — AB (ref <150)
Total CHOL/HDL Ratio: 6.1 RATIO
VLDL: 46 mg/dL — AB (ref 0–40)

## 2015-11-15 LAB — HEMOGLOBIN A1C: Hgb A1c MFr Bld: 5.5 % (ref 4.0–6.0)

## 2015-11-15 LAB — TSH: TSH: 3.896 u[IU]/mL (ref 0.350–4.500)

## 2015-11-15 MED ORDER — QUETIAPINE FUMARATE 300 MG PO TABS: 300.0000 mg | ORAL_TABLET | Freq: Every day | ORAL | Status: DC

## 2015-11-15 MED ORDER — TRAZODONE HCL 100 MG PO TABS: 200.0000 mg | ORAL_TABLET | Freq: Every evening | ORAL | Status: AC | PRN

## 2015-11-15 MED ORDER — MELOXICAM 15 MG PO TABS: 15.0000 mg | ORAL_TABLET | Freq: Every day | ORAL | Status: DC

## 2015-11-15 MED ORDER — CITALOPRAM HYDROBROMIDE 40 MG PO TABS: 30.0000 mg | ORAL_TABLET | Freq: Every day | ORAL | Status: DC

## 2015-11-15 MED ORDER — QUETIAPINE FUMARATE 100 MG PO TABS: 100.0000 mg | ORAL_TABLET | Freq: Every morning | ORAL | Status: DC

## 2015-11-15 MED ORDER — ATORVASTATIN CALCIUM 80 MG PO TABS: 80.0000 mg | ORAL_TABLET | Freq: Every day | ORAL | Status: DC

## 2015-11-15 MED ORDER — CITALOPRAM HYDROBROMIDE 40 MG PO TABS
40.0000 mg | ORAL_TABLET | Freq: Every day | ORAL | Status: AC
Start: 2015-11-15 — End: ?

## 2015-11-15 NOTE — [facility] Group Notes (Signed)
[facility] Group Notes:  (Nursing/MHT/Case Management/Adjunct)  Date:  11/15/2015  Time:  10:22 AM  Type of Therapy:  goal setting  Participation Level:  Did Not Attend  Ian [name] Ian [name] 11/15/2015, 10:22 AM

## 2015-11-15 NOTE — [facility] Group Notes (Signed)
[facility] Group Notes:  (Nursing/MHT/Case Management/Adjunct)  Date:  11/15/2015  Time:  1:43 AM  Type of Therapy:  Group Therapy  Participation Level:  Active  Participation Quality:  Appropriate  Affect:  Appropriate  Cognitive:  Appropriate  Insight:  Appropriate  Engagement in Group:  Engaged  Modes of Intervention:  n/a  Summary of Progress/Problems:  Ian [name] 11/15/2015, 1:43 AM

## 2015-11-15 NOTE — [facility] Group Notes (Signed)
[facility] LCSW Group Therapy  11/15/2015 2:17 PM  Type of Therapy:  Group Therapy  Participation Level:  Did Not Attend  Modes of Intervention:  Discussion, Education, Socialization and Support  Summary of Progress/Problems:Todays topic: Grudges  Patients will be encouraged to discuss their thoughts, feelings, and behaviors as to why one holds on to grudges and reasons why people have grudges. Patients will process the impact of grudges on their daily lives and identify thoughts and feelings related to holding grudges. Patients will identify feelings and thoughts related to what life would look like without grudges.    Ian [name] L [name] MSW, LCSWA  11/15/2015, 2:17 PM  

## 2015-11-15 NOTE — Progress Notes (Signed)
D/C instructions/transition record/suicide risk assessment/meds/follow-up appointments reviewed and given to patient, pt verbalized understanding, pt's belongings returned to pt, denies SI/HI/AVH, 7 day supply given, patient understands that he is to make a follow up appointment with [facility] for medication management, pt to call tomorrow.

## 2015-11-15 NOTE — Discharge Summary (Signed)
Physician Discharge Summary Note  Patient:  Ian [name] is an 35 y.o., [name] MRN:  [id number] DOB:  03/29/1981 Patient phone:  [phone] (home)  Patient address:   [address] [place] [id number],  Total Time spent with patient: 30 minutes  Date of Admission:  11/13/2015 Date of Discharge: 11/15/2015  Reason for Admission:  Suicide attempt.  Identifying data. Ian [name] is a 35 year old [name] with a history of depression and severe anxiety.  Chief complaint. "I had a bad panic attack."  History of present illness. Information was obtained from the patient and the chart. The patient has a long history of depression and anxiety. With treatment his depression resolved completely he however still suffers some level of daily anxiety compounded by frequent panic attacks. He has daily "small" panic attacks and at least 2 "big" once every week. Dr. Daleen Boavi who is his primary psychiatrist has been tapering him off Valium. On the day of admission he had a major panic attack and took too many Valiums. His family noticed that he was sedated and brought him to the hospital. He denies that he overdosed in a suicide attempt rather he was trying to deal with his anxiety. She denies any symptoms of depression or psychosis. He denies symptoms suggestive of bipolar mania. He denies other forms of anxiety like PTSD or OCD. He denies alcohol or illicit substance use.  Past psychiatric history. He had one overdose on Valium in the past. He denies that it was a suicide attempt rather he was taking it to feel better. He was never in the hospital. He is now in the process of tapering off Valium.  Family psychiatric history. None reported.  Social history. He lives with his parents and his homebound. He tells me that he doesn't Get out of the house or meet people. He is afraid that he may have panic attack in public.  Principal Problem: Agoraphobia with panic disorder Discharge Diagnoses: Patient Active Problem List    Diagnosis Date Noted  . Dyslipidemia [E78.5] 11/13/2015  . Benzodiazepine overdose [T42.4X1A] 06/19/2015  . Sedative, hypnotic or anxiolytic use disorder, severe, dependence ([facility]) [F13.20] 03/24/2015  . Generalized anxiety disorder [F41.1] 03/24/2015  . 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 Depression, major, recurrent, moderate ([facility]) [F33.1] 02/09/2015   Past Medical History:  Past Medical History  Diagnosis Date  . Hypertension   . Hypercholesteremia   . Anxiety   . Depression   . Panic disorder 03/24/2015   History reviewed. No pertinent past surgical history. Family History:  Family History  Problem Relation Age of Onset  . Heart attack Mother   . Hypertension Mother   . Hypercholesterolemia Mother   . Heart attack Father   . Hypertension Father   . Hypercholesterolemia Father   . Depression Maternal Uncle   . Suicidality Maternal Uncle   . Depression Cousin   . Suicidality Cousin    Social History:  History  Alcohol Use No     History  Drug Use No    Social History   Social History  . Marital Status: Single    Spouse Name: N/A  . Number of Children: N/A  . Years of Education: N/A   Social History Main Topics  . Smoking status: Former Smoker -- 0.75 packs/day for 16 years    Types: Cigarettes    Quit date: 04/05/2015  .  Smokeless tobacco: Never Used  . Alcohol Use: No  . Drug Use: No  . Sexual Activity: No   Other Topics Concern  . None   Social History Narrative    Hospital Course:    Ian [name] is a 35 year old [name] of severe anxiety admitted after overdose on Valium.  1. Suicidal ideation. This has resolved. The patient is able to contract for safety. He is forward thinking and optimistic about the future.  2. Mood. He has been maintained on Celexa and Seroquel with excellent results.  3.  Anxiety. He has been maintained on Valium by his previous psychiatrist. His current provider is tapering him off. He will no longer be prescribed Valium in her office. We offered brief Librium taper.  4. Insomnia. He is on trazodone.  5. Dyslipidemia. He is on Lipitor.  6. Smoking. Nicotine patch is available.  7. Metabolic syndrome screening. Lipid panel, hemoglobin A1c, TSH and prolactin are pending.   8. Disposition. He was discharged to home with his family. He will follow up with Dr. Daleen Boavi.  Physical Findings: AIMS:  , ,  ,  , Dental Status Current problems with teeth and/or dentures?: Yes (upper & lower missing teeth) Does patient usually wear dentures?: Yes  CIWA:    COWS:     Musculoskeletal: Strength & Muscle Tone: within normal limits Gait & Station: normal Patient leans: N/A  Psychiatric Specialty Exam: Physical Exam  Nursing note and vitals reviewed.   Review of Systems  Psychiatric/Behavioral: The patient is nervous/anxious.   All other systems reviewed and are negative.   Blood pressure 144/94, pulse 111, temperature 97.8 F (36.6 C), temperature source Oral, resp. rate 18, height 6\' 1"  (1.854 m), weight 168.738 kg (372 lb), SpO2 97 %.Body mass index is 49.09 kg/(m^2).  See SRA.                                                  Sleep:  Number of Hours: 5     Have you used any form of tobacco in the last 30 days? (Cigarettes, Smokeless Tobacco, Cigars, and/or Pipes): No  Has this patient used any form of tobacco in the last 30 days? (Cigarettes, Smokeless Tobacco, Cigars, and/or Pipes) Yes, Yes, A prescription for an FDA-approved tobacco cessation medication was offered at discharge and the patient refused  Blood Alcohol level:  Lab Results  Component Value Date   [facility] 11/[facility]   ETH <5 06/19/2015    Metabolic Disorder Labs:  No results found for: HGBA1C, MPG Lab Results  Component Value Date   PROLACTIN 8.4 03/23/2015    Lab Results  Component Value Date   TRIG 271* 06/20/2015    See Psychiatric Specialty Exam and Suicide Risk Assessment completed by Attending Physician prior to discharge.  Discharge destination:  Home  Is patient on multiple antipsychotic therapies at discharge:  No   Has Patient had three or more failed trials of antipsychotic monotherapy by history:  No  Recommended Plan for Multiple Antipsychotic Therapies: NA  Discharge Instructions    Diet - low sodium heart healthy    Complete by:  As directed      Increase activity slowly    Complete by:  As directed             Medication List    STOP taking these medications  diazepam 5 MG tablet  Commonly known as:  VALIUM      TAKE these medications      Indication   acetaminophen 325 MG tablet  Commonly known as:  TYLENOL  Take 2 tablets (650 mg total) by mouth every 6 (six) hours as needed for mild pain (or Fever >/= 101).      atorvastatin 80 MG tablet  Commonly known as:  LIPITOR  Take 1 tablet (80 mg total) by mouth daily.   Indication:  Inherited Homozygous Hypercholesterolemia     citalopram 40 MG tablet  Commonly known as:  CELEXA  Take 1 tablet (40 mg total) by mouth daily.   Indication:  Depression     meloxicam 15 MG tablet  Commonly known as:  MOBIC  Take 1 tablet (15 mg total) by mouth daily.   Indication:  Joint Damage causing Pain and Loss of Function, joint pain     QUEtiapine 100 MG tablet  Commonly known as:  SEROQUEL  Take 1 tablet (100 mg total) by mouth every morning.   Indication:  Depressive Phase of Manic-Depression, anxiety     QUEtiapine 300 MG tablet  Commonly known as:  SEROQUEL  Take 1 tablet (300 mg total) by mouth at bedtime.   Indication:  Depressive Phase of Manic-Depression     traZODone 100 MG tablet  Commonly known as:  DESYREL  Take 2 tablets (200 mg total) by mouth at bedtime as needed for sleep.   Indication:  Trouble Sleeping         Follow-up  recommendations:  Activity:  As tolerated. Diet:  Low sodium heart healthy. Other:  keep follow-up appointments.  Comments:    Signed: Kristine Linea, MD 11/15/2015, 8:56 AM

## 2015-11-15 NOTE — Progress Notes (Signed)
  [facility] :  Will you be returning to the same living situation after discharge:  Yes,  home At discharge, do you have transportation home?: Yes,  father Do you have the ability to pay for your medications: Yes,  Parents   Release of information consent forms completed and in the chart;  Patient's signature needed at discharge.  Patient to Follow up at: Follow-up Information    Follow up with [facility] . Schedule an appointment as soon as possible for a visit in 1 day.   Why:  Please call as soon as possible for hospital follow up appointment.    Contact information:   [address], [address] [place], [place] [address] Phone: [phone] Fax: [phone]       [facility].   Why:  *Information only* Walk in hours are Monday- Friday between 8:00am and 4:00pm. Please arrive as early as possible for prompt service.    Contact information:   [address] [place], [place] [address] Phone: [phone] Fax: [phone]      Next level of care provider has access to [facility] Link:no  Safety Planning and Suicide Prevention discussed: Yes,  with patient and father   Have you used any form of tobacco in the last 30 days? (Cigarettes, Smokeless Tobacco, Cigars, and/or Pipes): No  Has patient been referred to the Quitline?: Patient refused referral  Patient has been referred for addiction treatment: Pt. refused referral  Rondall AllegraCandace L Brevon Dewald MSW, LCSWA  11/15/2015, 12:09 PM

## 2015-11-15 NOTE — BHH Suicide Risk Assessment (Signed)
[facility]   Principal Problem: Agoraphobia with panic disorder Discharge Diagnoses:  Patient Active Problem List   Diagnosis Date Noted  . Dyslipidemia [E78.5] 11/13/2015  . Benzodiazepine overdose [T42.4X1A] 06/19/2015  . Sedative, hypnotic or anxiolytic use disorder, severe, dependence (HCC) [F13.20] 03/24/2015  . Generalized anxiety disorder [F41.1] 03/24/2015  . 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 Depression, major, recurrent, moderate (HCC) [F33.1] 02/09/2015    Total Time spent with patient: 30 minutes  Musculoskeletal: Strength & Muscle Tone: within normal limits Gait & Station: normal Patient leans: N/A  Psychiatric Specialty Exam: Review of Systems  Psychiatric/Behavioral: The patient is nervous/anxious.   All other systems reviewed and are negative.   Blood pressure 144/94, pulse 111, temperature 97.8 F (36.6 C), temperature source Oral, resp. rate 18, height 6\' 1"  (1.854 m), weight 168.738 kg (372 lb), SpO2 97 %.Body mass index is 49.09 kg/(m^2).  General Appearance: Casual  Eye Contact::  Good  Speech:  Clear and Coherent409  Volume:  Normal  Mood:  Euthymic  Affect:  Appropriate  Thought Process:  Goal Directed  Orientation:  Full (Time, Place, and Person)  Thought Content:  WDL  Suicidal Thoughts:  No  Homicidal Thoughts:  No  Memory:  Immediate;   Fair Recent;   Fair Remote;   Fair  Judgement:  Impaired  Insight:  Shallow  Psychomotor Activity:  Normal  Concentration:  Fair  Recall:  Fis[employer]Fair  Fund of Knowledge:Fair  Language: Fair  Akathisia:  No  Handed:  Right  AIMS (if indicated):     Assets:  Communication Skills Desire for Improvement Housing Physical Health Resilience Social Support  Sleep:  Number of Hours: 5  Cognition: WNL  ADL's:  Intact   Mental Status  Per Nursing Assessment::   On Admission:     Demographic Factors:  Male, Caucasian, Low socioeconomic status and Unemployed  Loss Factors: Financial problems/change in socioeconomic status  Historical Factors: Family history of mental illness or substance abuse and Impulsivity  Risk Reduction Factors:   Sense of responsibility to family, Living with another person, especially a relative, Positive social support and Positive therapeutic relationship  Continued Clinical Symptoms:  Severe Anxiety and/or Agitation Panic Attacks  Cognitive Features That Contribute To Risk:  None    Suicide Risk:  Minimal: No identifiable suicidal ideation.  Patients presenting with no risk factors but with morbid ruminations; may be classified as minimal risk based on the severity of the depressive symptoms    Plan Of Care/Follow-up recommendations:  Activity:  As tolerated. Diet:  Low sodium heart healthy. Other:  Keep follow-up appointments.  Kristine LineaJolanta Suzanne Kho, MD 11/15/2015, 8:53 AM

## 2015-11-15 NOTE — BHH Counselor (Signed)
  PSA was not completed. Pt was discharged within 48 hours.   Daisy FloroCandace L Gracielynn Birkel MSW, LCSWA  11/15/2015 12:04 PM

## 2015-11-15 NOTE — B[facility] Suicide Risk Assessment (Signed)
B[facility] INPATIENT:  Family/Significant Other Suicide Prevention Education  Suicide Prevention Education:  Education Completed;Jennette BillWallace Suell (father) [phone]  has been identified by the patient as the family member/significant other with whom the patient will be residing, and identified as the person(s) who will aid the patient in the event of a mental health crisis (suicidal ideations/suicide attempt).  With written consent from the patient, the family member/significant other has been provided the following suicide prevention education, prior to the and/or following the discharge of the patient.  The suicide prevention education provided includes the following:  Suicide risk factors  Suicide prevention and interventions  National Suicide Hotline telephone number  [facility] assessment telephone number  [facility] Emergency Assistance 911  [facility] and/or Residential Mobile Crisis Unit telephone number  Request made of family/significant other to:  Remove weapons (e.g., guns, rifles, knives), all items previously/currently identified as safety concern.    Remove drugs/medications (over-the-counter, prescriptions, illicit drugs), all items previously/currently identified as a safety concern.  The family member/significant other verbalizes understanding of the suicide prevention education information provided.  The family member/significant other agrees to remove the items of safety concern listed above.  Yoshio Seliga L Saraia Platner MSW, LCSWA  11/15/2015, 11:45 AM

## 2015-11-15 NOTE — Tx Team (Signed)
Interdisciplinary Treatment Plan Update (Adult)  Date:  11/15/2015 Time Reviewed:  12:05 PM  Progress in Treatment: Attending groups: Yes. Participating in groups:  Yes. Taking medication as prescribed:  Yes. Tolerating medication:  Yes. Family/Significant othe contact made:  Yes, individual(s) contacted:  father  Patient understands diagnosis:  Yes. Discussing patient identified problems/goals with staff:  Yes. Medical problems stabilized or resolved:  Yes. Denies suicidal/homicidal ideation: Yes. Issues/concerns per patient self-inventory:  Yes. Other:  New problem(s) identified: No, Describe:  NA  Discharge Plan or Barriers: Pt plans to return home and follow up with outpatient.    Reason for Continuation of Hospitalization: Anxiety Depression Medication stabilization  CommentsThe patient has a long history of depression and anxiety. With treatment his depression resolved completely he however still suffers some level of daily anxiety compounded by frequent panic attacks. He has daily "small" panic attacks and at least 2 "big" once every week. Dr. Einar Grad who is his primary psychiatrist has been tapering him off Valium. On the day of admission he had a major panic attack and took too many Valiums. His family noticed that he was sedated and brought him to the hospital. He denies that he overdosed in a suicide attempt rather he was trying to deal with his anxiety. She denies any symptoms of depression or psychosis. He denies symptoms suggestive of bipolar mania. He denies other forms of anxiety like PTSD or OCD. He denies alcohol or illicit substance use.:  Estimated length of stay: Pt will d/c today.   New goal(s): NA  Review of initial/current patient goals per problem list:   1.  Goal(s): Patient will participate in aftercare plan * Met: Yes * Target date: at discharge * As evidenced by: Patient will participate within aftercare plan AEB aftercare provider and housing plan at  discharge being identified.   2.  Goal (s): Patient will exhibit decreased depressive symptoms and suicidal ideations. * Met: Yes *  Target date: at discharge * As evidenced by: Patient will utilize self rating of depression at 3 or below and demonstrate decreased signs of depression or be deemed stable for discharge by MD.   3.  Goal(s): Patient will demonstrate decreased signs and symptoms of anxiety. * Met: Yes * Target date: at discharge * As evidenced by: Patient will utilize self rating of anxiety at 3 or below and demonstrated decreased signs of anxiety, or be deemed stable for discharge by MD  Attendees: Patient:  Ian [name] 6/25/201712:05 PM  Family:   6/25/201712:05 PM  Physician:   Dr. Bary Leriche  6/25/201712:05 PM  Nursing:   Carolynn Sayers, RN  6/25/201712:05 PM  Case Manager:   6/25/201712:05 PM  Counselor:   6/25/201712:05 PM  Other:  Wray Kearns, [place] 6/25/201712:05 PM  Other:  Everitt Amber, [place]  6/25/201712:05 PM  Other:   6/25/201712:05 PM  Other:  6/25/201712:05 PM  Other:  6/25/201712:05 PM  Other:  6/25/201712:05 PM  Other:  6/25/201712:05 PM  Other:  6/25/201712:05 PM  Other:  6/25/201712:05 PM  Other:   6/25/201712:05 PM   Scribe for Treatment Team:   Wray Kearns MSW, [place] , 11/15/2015, 12:05 PM

## 2015-11-16 LAB — PROLACTIN: PROLACTIN: 31.9 ng/mL — AB (ref 4.0–15.2)

## 2015-11-26 ENCOUNTER — Ambulatory Visit: Payer: Self-pay | Admitting: Psychiatry

## 2016-05-28 ENCOUNTER — Ambulatory Visit
Admission: EM | Admit: 2016-05-28 | Discharge: 2016-05-28 | Disposition: A | Discharge status: Home / Self Care | Payer: Self-pay | Attending: Emergency Medicine | Admitting: Emergency Medicine

## 2016-05-28 ENCOUNTER — Encounter: Payer: Self-pay | Admitting: Emergency Medicine

## 2016-05-28 DIAGNOSIS — H6122 Impacted cerumen, left ear: Secondary | ICD-10-CM

## 2016-05-28 DIAGNOSIS — H9202 Otalgia, left ear: Secondary | ICD-10-CM

## 2016-05-28 NOTE — Discharge Instructions (Signed)
Avoid irritation to area.  Follow up with your primary care physician this week as needed. Return to Urgent care for new or worsening concerns.

## 2016-05-28 NOTE — ED Provider Notes (Signed)
[facility] ____________________________________________  Time seen: Approximately 10:13 AM  I have reviewed the triage vital signs and the nursing notes.   HISTORY  Chief Complaint Otalgia (left ear)   HPI LATAVIUS CAPIZZI is a 36 y.o. male presenting with a complaint of left ear pain for the last 3 days. Patient reports mild pain at this time describes as an aching sensation. Also reports ear feels clogged with muffled hearing and intermittent ringing in his ear. Patient reports just prior to onset of these complaints he was cleaning his ear with a Q-tip. Patient reports that he was getting out of a large amount of dark wax and extend your began to feel clogged. Patient reports that he then put peroxide in his ear and then he had the onset of hot sensation with gradual onset of muffled hearing and ringing. Patient denies fall, injury or trauma. Denies history of similar in the past. Denies right ear complaints.   Denies recent cough, congestion, headache, neck or back pain. Denies other complaints. Patient reports feels well. Denies any recent medication changes. Denies any over-the-counter medication use recently.  [facility]: PCP   Past Medical History:  Diagnosis Date  . Anxiety   . Depression   . Hypercholesteremia   . Hypertension   . Panic disorder 03/24/2015    Patient Active Problem List   Diagnosis Date Noted  . Dyslipidemia 11/13/2015  . Benzodiazepine overdose 06/19/2015  . 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 Generalized anxiety disorder 03/24/2015  . Alcohol abuse, in remission 03/24/2015  . 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 Depression, major, recurrent, in complete remission (HCC) 02/09/2015  . Depression, major, recurrent, moderate (HCC) 02/09/2015    History reviewed. No pertinent surgical history.  Current Outpatient Rx  . Order #: [id number] Class: Historical Med  .  Order #: [id number] Class: OTC  . Order #: [id number] Class: Print  . Order #: [id number] Class: Print  . Order #: [id number] Class: Print  . Order #: [id number] Class: Print    No current facility-administered medications for this encounter.   Current Outpatient Prescriptions:  .  gabapentin (NEURONTIN) 300 MG capsule, Take 300 mg by mouth 2 (two) times daily., Disp: , Rfl:  .  acetaminophen (TYLENOL) 325 MG tablet, Take 2 tablets (650 mg total) by mouth every 6 (six) hours as needed for mild pain (or Fever >/= 101)., Disp: , Rfl:  .  atorvastatin (LIPITOR) 80 MG tablet, Take 1 tablet (80 mg total) by mouth daily., Disp: 30 tablet, Rfl: 0 .  citalopram (CELEXA) 40 MG tablet, Take 1 tablet (40 mg total) by mouth daily., Disp: 30 tablet, Rfl: 2 .  meloxicam (MOBIC) 15 MG tablet, Take 1 tablet (15 mg total) by mouth daily., Disp: 30 tablet, Rfl: 1 .  traZODone (DESYREL) 100 MG tablet, Take 2 tablets (200 mg total) by mouth at bedtime as needed for sleep., Disp: 60 tablet, Rfl: 2  Allergies Patient has no known allergies.  Family History  Problem Relation Age of Onset  . Heart attack Mother   . Hypertension Mother   . Hypercholesterolemia Mother   . Heart attack Father   . Hypertension Father   . Hypercholesterolemia Father   . Depression Maternal Uncle   . Suicidality Maternal Uncle   . Depression Cousin   . Suicidality Cousin     Social History Social History  Substance Use Topics  . Smoking status: Former Smoker    Packs/day: 0.75  Years: 16.00    Types: Cigarettes    Quit date: 04/05/2015  . Smokeless tobacco: Never Used  . Alcohol use No    Review of Systems Constitutional: No fever/chills Eyes: No visual changes. ENT: No sore throat. Cardiovascular: Denies chest pain. Respiratory: Denies shortness of breath. Gastrointestinal: No abdominal pain.  No nausea, no vomiting.  No diarrhea.  No constipation. Genitourinary: Negative for dysuria. Musculoskeletal: Negative for  back pain. Skin: Negative for rash. Neurological: Negative for headaches, focal weakness or numbness.  10-point ROS otherwise negative.  ____________________________________________   PHYSICAL EXAM:  VITAL SIGNS: ED Triage Vitals  Enc Vitals Group     BP 05/28/16 0937 122/86     Pulse Rate 05/28/16 0937 86     Resp 05/28/16 0937 18     Temp 05/28/16 0937 98.8 F (37.1 C)     Temp Source 05/28/16 0937 Oral     SpO2 05/28/16 0937 97 %     Weight 05/28/16 0935 (!) 372 lb (168.7 kg)     Height 05/28/16 0935 6\' 1"  (1.854 m)     Head Circumference --      Peak Flow --      Pain Score 05/28/16 0936 5     Pain Loc --      Pain Edu? --      Excl. in GC? --     Constitutional: Alert and oriented. Well appearing and in no acute distress. Eyes: Conjunctivae are normal. PERRL. EOMI. ENT      Head: Normocephalic and atraumatic.     Ears: Right: Nontender, no erythema, normal TM. Left: Nontender, total cerumen impaction present, post irrigation and cerumen removal, canal clear, and no erythema and normal TM, mild superficial appearance abrasion noted to canal without swelling or exudate. No swelling, erythema or tenderness surrounding bilaterally.       Nose: No congestion/rhinnorhea.      Mouth/Throat: Mucous membranes are moist.Oropharynx non-erythematous. Neck: No stridor. Supple without meningismus.  Hematological/Lymphatic/Immunilogical: No cervical lymphadenopathy. Cardiovascular: Normal rate, regular rhythm. Grossly normal heart sounds.  Good peripheral circulation. Respiratory: Normal respiratory effort without tachypnea nor retractions. Breath sounds are clear and equal bilaterally. No wheezes/rales/rhonchi.. Musculoskeletal: No midline cervical, thoracic or lumbar tenderness to palpation. Neurologic:  Normal speech and language. Speech is normal. No gait instability.  Skin:  Skin is warm, dry and intact. No rash noted. Psychiatric: Mood and affect are normal. Speech and  behavior are normal. Patient exhibits appropriate insight and judgment   ___________________________________________   LABS (all labs ordered are listed, but only abnormal results are displayed)  Labs Reviewed - No data to display  PROCEDURES Procedures   Cerumen impaction present left ear.  Wax is removed by CMA with irrigation. Instructions for home care to prevent wax buildup are given.   INITIAL IMPRESSION / ASSESSMENT AND PLAN / ED COURSE  Pertinent labs & imaging results that were available during my care of the patient were reviewed by me and considered in my medical decision making (see chart for details).  well-appearing patient. No acute distress. Left total cerumen impaction present. After irrigation and cerumen removal by CMA, patient reports ear discomfort is almost fully resolved. Patient reports the muffled sensation and ringing in his ear has now fully resolved. Patient reports he is feeling much better. Ear reexamined, minimal irritation noted to ear canal, no swelling or appearance of infection. Encouraged supportive care and avoidance of irritation.  Discussed follow up with Primary care physician this week. Discussed follow  up and return parameters including no resolution or any worsening concerns. Patient verbalized understanding and agreed to plan.   ____________________________________________   FINAL CLINICAL IMPRESSION(S) / ED DIAGNOSES  Final diagnoses:  Impacted cerumen of left ear  Acute otalgia, left     Discharge Medication List as of 05/28/2016 10:32 AM      Note: This dictation was prepared with Dragon dictation along with smaller phrase technology. Any transcriptional errors that result from this process are unintentional.    Clinical Course       Renford Dills, NP 05/28/16 1047

## 2016-05-28 NOTE — ED Triage Notes (Signed)
Patient c/o left ear pain for the past 3 days.   

## 2017-07-25 ENCOUNTER — Emergency Department
Admission: EM | Admit: 2017-07-25 | Discharge: 2017-07-26 | Disposition: A | Discharge status: Home / Self Care | Payer: Self-pay | Attending: Emergency Medicine | Admitting: Emergency Medicine

## 2017-07-25 DIAGNOSIS — Z87891 Personal history of nicotine dependence: Secondary | ICD-10-CM | POA: Insufficient documentation

## 2017-07-25 DIAGNOSIS — F1011 Alcohol abuse, in remission: Secondary | ICD-10-CM | POA: Insufficient documentation

## 2017-07-25 DIAGNOSIS — F1910 Other psychoactive substance abuse, uncomplicated: Secondary | ICD-10-CM

## 2017-07-25 DIAGNOSIS — R4182 Altered mental status, unspecified: Secondary | ICD-10-CM

## 2017-07-25 DIAGNOSIS — F4001 Agoraphobia with panic disorder: Secondary | ICD-10-CM | POA: Diagnosis present

## 2017-07-25 DIAGNOSIS — F411 Generalized anxiety disorder: Secondary | ICD-10-CM | POA: Diagnosis present

## 2017-07-25 DIAGNOSIS — T4271XA Poisoning by unspecified antiepileptic and sedative-hypnotic drugs, accidental (unintentional), initial encounter: Secondary | ICD-10-CM

## 2017-07-25 DIAGNOSIS — F331 Major depressive disorder, recurrent, moderate: Secondary | ICD-10-CM | POA: Diagnosis present

## 2017-07-25 DIAGNOSIS — I10 Essential (primary) hypertension: Secondary | ICD-10-CM | POA: Insufficient documentation

## 2017-07-25 DIAGNOSIS — F329 Major depressive disorder, single episode, unspecified: Secondary | ICD-10-CM | POA: Insufficient documentation

## 2017-07-25 DIAGNOSIS — Z79899 Other long term (current) drug therapy: Secondary | ICD-10-CM | POA: Insufficient documentation

## 2017-07-25 MED ORDER — LORAZEPAM 2 MG/ML IJ SOLN
2.0000 mg | Freq: Once | INTRAMUSCULAR | Status: AC
  Administered 2017-07-26: 2 mg via INTRAVENOUS
  Filled 2017-07-25: qty 1

## 2017-07-25 MED ORDER — SODIUM CHLORIDE 0.9 % IV BOLUS (SEPSIS)
1000.0000 mL | Freq: Once | INTRAVENOUS | Status: AC
  Administered 2017-07-26: 1000 mL via INTRAVENOUS

## 2017-07-26 ENCOUNTER — Emergency Department: Payer: Self-pay

## 2017-07-26 DIAGNOSIS — T4271XA Poisoning by unspecified antiepileptic and sedative-hypnotic drugs, accidental (unintentional), initial encounter: Secondary | ICD-10-CM

## 2017-07-26 LAB — CBC
HEMATOCRIT: 46.7 % (ref 40.0–52.0)
HEMOGLOBIN: 15.5 g/dL (ref 13.0–18.0)
MCH: 30.8 pg (ref 26.0–34.0)
MCHC: 33.3 g/dL (ref 32.0–36.0)
MCV: 92.4 fL (ref 80.0–100.0)
Platelets: 306 10*3/uL (ref 150–440)
RBC: 5.05 MIL/uL (ref 4.40–5.90)
RDW: 14.1 % (ref 11.5–14.5)
WBC: 16.8 10*3/uL — ABNORMAL HIGH (ref 3.8–10.6)

## 2017-07-26 LAB — URINE DRUG SCREEN, QUALITATIVE (ARMC ONLY)
Amphetamines, Ur Screen: NOT DETECTED
BARBITURATES, UR SCREEN: NOT DETECTED
Benzodiazepine, Ur Scrn: POSITIVE — AB
Cannabinoid 50 Ng, Ur ~~LOC~~: NOT DETECTED
Cocaine Metabolite,Ur ~~LOC~~: NOT DETECTED
MDMA (Ecstasy)Ur Screen: NOT DETECTED
Methadone Scn, Ur: NOT DETECTED
OPIATE, UR SCREEN: NOT DETECTED
PHENCYCLIDINE (PCP) UR S: NOT DETECTED
Tricyclic, Ur Screen: NOT DETECTED

## 2017-07-26 LAB — COMPREHENSIVE METABOLIC PANEL
ALK PHOS: 70 U/L (ref 38–126)
ALT: 19 U/L (ref 17–63)
ANION GAP: 13 (ref 5–15)
AST: 26 U/L (ref 15–41)
Albumin: 4.5 g/dL (ref 3.5–5.0)
BILIRUBIN TOTAL: 1.4 mg/dL — AB (ref 0.3–1.2)
BUN: 12 mg/dL (ref 6–20)
CALCIUM: 9.7 mg/dL (ref 8.9–10.3)
CO2: 23 mmol/L (ref 22–32)
CREATININE: 1.26 mg/dL — AB (ref 0.61–1.24)
Chloride: 107 mmol/L (ref 101–111)
GFR calc non Af Amer: >60 mL/min (ref >60)
Glucose, Bld: 109 mg/dL — ABNORMAL HIGH (ref 65–99)
Potassium: 3.8 mmol/L (ref 3.5–5.1)
SODIUM: 143 mmol/L (ref 135–145)
TOTAL PROTEIN: 8.2 g/dL — AB (ref 6.5–8.1)

## 2017-07-26 LAB — SALICYLATE LEVEL: Salicylate Lvl: <7 mg/dL (ref 2.8–30.0)

## 2017-07-26 LAB — MAGNESIUM: Magnesium: 1.8 mg/dL (ref 1.7–2.4)

## 2017-07-26 LAB — ETHANOL: Alcohol, Ethyl (B): <10 mg/dL (ref <10)

## 2017-07-26 LAB — ACETAMINOPHEN LEVEL

## 2017-07-26 LAB — PHOSPHORUS: Phosphorus: 3.6 mg/dL (ref 2.5–4.6)

## 2017-07-26 MED ORDER — HALOPERIDOL LACTATE 5 MG/ML IJ SOLN
5.0000 mg | Freq: Once | INTRAMUSCULAR | Status: AC
  Administered 2017-07-26: 5 mg via INTRAVENOUS

## 2017-07-26 MED ORDER — SODIUM CHLORIDE 0.9 % IV BOLUS (SEPSIS)
1000.0000 mL | Freq: Once | INTRAVENOUS | Status: AC
  Administered 2017-07-26: 1000 mL via INTRAVENOUS

## 2017-07-26 MED ORDER — HALOPERIDOL LACTATE 5 MG/ML IJ SOLN
5.0000 mg | Freq: Once | INTRAMUSCULAR | Status: DC
  Filled 2017-07-26: qty 1

## 2017-07-26 NOTE — ED Notes (Signed)
Spoke with Dr. Zenda AlpersWebster and Annette StableBill, RN-charge nurse about patient under IVC. Instructed and confirmed that patient needs to be changed into maroon scrubs at this time. Felicia, ED Tech assisting patient to change into these at this time.

## 2017-07-26 NOTE — ED Notes (Signed)
Pt. Removed monitoring equipment and sat at edge of bed.  Pt. Asked if he took anything tonight or if anything different happen tonight.  Pt. Denied anything different tonight.

## 2017-07-26 NOTE — ED Notes (Signed)
Dr Toni Amendlapacs rescinded IVC papers/MD Scotty CourtStafford, RN Maryelizabeth KaufmannGigi, and [employer] security made aware.

## 2017-07-26 NOTE — BH Assessment (Signed)
Writer called and left a HIPPA Compliant message with emergency contact Earlene Plater(Wallace Nauman-[phone]), requesting a return phone call.

## 2017-07-26 NOTE — ED Triage Notes (Signed)
Pt. Here via EMS from home due to agitation.

## 2017-07-26 NOTE — ED Provider Notes (Signed)
[facility] Emergency Department Provider Note   ____________________________________________   First MD Initiated Contact with Patient 07/25/17 2340     (approximate)  I have reviewed the triage vital signs and the nursing notes.   HISTORY  Chief Complaint Altered mental status.  History is limited due to patient's altered mental status  HPI Ian [name] is a 37 y.o. male who was brought into the hospital by EMS.  According to EMS the patient is here for possible overdose.  The patient lives with his parents and they state that he has been having some twitching behavior as well as some altered mental status.  The patient had similar symptoms a few years ago and had taken his father's baclofen.  He is having again some jerking movements.  The patient told EMS that he has not mixed any of his medications that he is been taking his medications appropriately.  He was pacing back and forth and not really answering questions.  The patient just keeps saying I am fine I am fine.  He will have some moments of lucidity where he will answer questions such as his birthday but he is not exactly sure where he is or the date.  The patient is here for evaluation.   Past Medical History:  Diagnosis Date  . Anxiety   . Depression   . Hypercholesteremia   . Hypertension   . Panic disorder 03/24/2015    Patient Active Problem List   Diagnosis Date Noted  . Dyslipidemia 11/13/2015  . Benzodiazepine overdose 06/19/2015  . 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 Generalized anxiety disorder 03/24/2015  . Alcohol abuse, in remission 03/24/2015  . 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 Depression, major, recurrent, in complete remission (HCC) 02/09/2015  . Depression, major, recurrent, moderate (HCC) 02/09/2015    No past surgical history on file.  Prior to Admission medications   Medication Sig Start Date End Date  Taking? Authorizing Provider  acetaminophen (TYLENOL) 325 MG tablet Take 2 tablets (650 mg total) by mouth every 6 (six) hours as needed for mild pain (or Fever >/= 101). 06/21/15   Ramonita Lab, MD  atorvastatin (LIPITOR) 80 MG tablet Take 1 tablet (80 mg total) by mouth daily. 11/15/15   Pucilowska, Braulio Conte B, MD  citalopram (CELEXA) 40 MG tablet Take 1 tablet (40 mg total) by mouth daily. 11/15/15   Pucilowska, Jolanta B, MD  gabapentin (NEURONTIN) 300 MG capsule Take 300 mg by mouth 2 (two) times daily.    [provider]  meloxicam (MOBIC) 15 MG tablet Take 1 tablet (15 mg total) by mouth daily. 11/15/15   Pucilowska, Braulio Conte B, MD  traZODone (DESYREL) 100 MG tablet Take 2 tablets (200 mg total) by mouth at bedtime as needed for sleep. 11/15/15   Pucilowska, Ellin Goodie, MD    Allergies Patient has no known allergies.  Family History  Problem Relation Age of Onset  . Heart attack Mother   . Hypertension Mother   . Hypercholesterolemia Mother   . Heart attack Father   . Hypertension Father   . Hypercholesterolemia Father   . Depression Maternal Uncle   . Suicidality Maternal Uncle   . Depression Cousin   . Suicidality Cousin     Social History Social History   Tobacco Use  . Smoking status: Former Smoker    Packs/day: 0.75    Years: 16.00    Pack years: 12.00    Types:  Cigarettes    Last attempt to quit: 04/05/2015    Years since quitting: 2.3  . Smokeless tobacco: Never Used  Substance Use Topics  . Alcohol use: No    Alcohol/week: 0.0 oz  . Drug use: No    Review of Systems  Constitutional: No fever/chills Eyes: No visual changes. ENT: No sore throat. Cardiovascular: Denies chest pain. Respiratory: Denies shortness of breath. Gastrointestinal: No abdominal pain.  No nausea, no vomiting.  No diarrhea.  No constipation. Genitourinary: Negative for dysuria. Musculoskeletal: Negative for back pain. Skin: Negative for rash. Neurological: Altered mental status  and twitching   ____________________________________________   PHYSICAL EXAM:  VITAL SIGNS: ED Triage Vitals  Enc Vitals Group     BP 07/26/17 0007 (!) 134/116     Pulse Rate 07/26/17 0007 100     Resp 07/26/17 0007 (!) 23     Temp --      Temp src --      SpO2 07/26/17 0007 95 %     Weight 07/26/17 0009 (!) 350 lb (158.8 kg)     Height 07/26/17 0009 6\' 5"  (1.956 m)     Head Circumference --      Peak Flow --      Pain Score --      Pain Loc --      Pain Edu? --      Excl. in GC? --     Constitutional: Alert and disoriented, patient's eyes starting side to side.  He can tell his name and his birthday but does not know the location or the date..  Agitated appearing and in moderate distress. Eyes: Conjunctivae are normal. PERRL. EOMI. pupils are about 3-4 mm in size Head: Atraumatic. Nose: No congestion/rhinnorhea. Mouth/Throat: Mucous membranes are moist.  Oropharynx non-erythematous. Cardiovascular: Normal rate, regular rhythm. Grossly normal heart sounds.  Good peripheral circulation. Respiratory: Mildly tachypneic.  No retractions. Lungs CTAB. Gastrointestinal: Soft and nontender. No distention.  Positive bowel sounds Musculoskeletal: No lower extremity tenderness nor edema.   Neurologic:  Normal speech and language.  Skin: Diaphoretic and clammy Psychiatric: Patient agitated, not answering questions appropriately.  Eyes regarding side to side and he is repeating that he is sorry  ____________________________________________   LABS (all labs ordered are listed, but only abnormal results are displayed)  Labs Reviewed  CBC - Abnormal; Notable for the following components:      Result Value   WBC 16.8 (*)    All other components within normal limits  COMPREHENSIVE METABOLIC PANEL - Abnormal; Notable for the following components:   Glucose, Bld 109 (*)    Creatinine, Ser 1.26 (*)    Total Protein 8.2 (*)    Total Bilirubin 1.4 (*)    All other components within  normal limits  URINE DRUG SCREEN, QUALITATIVE ([facility] ONLY) - Abnormal; Notable for the following components:   Benzodiazepine, Ur Scrn POSITIVE (*)    All other components within normal limits  ACETAMINOPHEN LEVEL - Abnormal; Notable for the following components:   Acetaminophen (Tylenol), Serum <10 (*)    All other components within normal limits  ETHANOL  MAGNESIUM  PHOSPHORUS  SALICYLATE LEVEL   ____________________________________________  EKG  ED ECG REPORT I, Rebecka Apley, the attending physician, personally viewed and interpreted this ECG.   Date: 07/25/2017  EKG Time: 2352  Rate: 101  Rhythm: sinus tachycardia  Axis: normal  Intervals:none  ST&T Change: none  ____________________________________________  RADIOLOGY  ED MD interpretation:  CT Head:  NAD  Official radiology report(s): Ct Head Wo Contrast  Result Date: 07/26/2017 CLINICAL DATA:  Altered level of consciousness. EXAM: CT HEAD WITHOUT CONTRAST TECHNIQUE: Contiguous axial images were obtained from the base of the skull through the vertex without intravenous contrast. Imaging obtained after sedation and with restraints. COMPARISON:  Head CT 06/19/2015 FINDINGS: Brain: Despite motion reducing measures of sedation, restraints, and repeat acquisitions there is moderate patient motion artifact. Allowing for this, no gross acute hemorrhage. No hydrocephalus. No large territorial infarct. The basilar cisterns are patent. More detailed assessment is limited. Vascular: Limited assessment.  No evidence of hyperdense vessel. Skull: Grossly normal. Sinuses/Orbits: No acute finding. Other: None. IMPRESSION: Motion limited exam. Allowing for limitations, no acute abnormality. Electronically Signed   By: Rubye OaksMelanie  Ehinger M.D.   On: 07/26/2017 01:29    ____________________________________________   PROCEDURES  Procedure(s) performed: None  Procedures  Critical Care performed:  No  ____________________________________________   INITIAL IMPRESSION / ASSESSMENT AND PLAN / ED COURSE  As part of my medical decision making, I reviewed the following data within the electronic MEDICAL RECORD NUMBER Notes from prior ED visits and [place] Controlled Substance Database   Is a 37 year old male who comes into the hospital today under involuntary commitment.  The concern is that the patient may have overdosed.  My differential diagnosis includes anticholinergic overdose versus other overdose.  Since the patient was twitching mildly when he came in I will give him some Ativan.  I will also give him a liter of normal saline and check some blood work on the patient.  I will also check a CT scan to ensure that he does not have any other organic cause of his altered mental status the patient will be reassessed once I receive the results of his blood work.      ____________________________________________   FINAL CLINICAL IMPRESSION(S) / ED DIAGNOSES  Final diagnoses:  Altered mental status, unspecified altered mental status type     ED Discharge Orders    None       Note:  This document was prepared using Dragon voice recognition software and may include unintentional dictation errors.    Rebecka ApleyWebster, Lamaria Hildebrandt P, MD 07/26/17 [phone]

## 2017-07-26 NOTE — ED Notes (Signed)
Called Lab to inquire on UDS.

## 2017-07-26 NOTE — ED Notes (Signed)
Lab stated they were running results.

## 2017-07-26 NOTE — ED Notes (Signed)
EMS reports family stated patient became agitated tonight.  Pt. Family states, pt. Became paranoid.  Pt. Still presents in a paranoid state.  Pt. States "I am nervous".  Pt. Asked, Did anything different happen tonight?  Pt. Responds with "I'm sorry".

## 2017-07-26 NOTE — ED Notes (Addendum)
Patient in room talking with Dr. Pietro Cassislapacc.

## 2017-07-26 NOTE — ED Notes (Signed)
Personal belongings returned to patient.Discharge instructions explained to patient.Verbalized understanding.Patient ambulated to lobby with staff.

## 2017-07-26 NOTE — ED Notes (Signed)
Pt dressed out by this RN and Sunny SchleinFelicia, EDT. Pt cooperative with dress out process.

## 2017-07-26 NOTE — ED Notes (Signed)
Patient dressed out, and placed in our scrubs  ,patient  clothes  Pair of light blue jeans , gray underwear ,  Dark red shirt with  a fish on front placed in bag for patient's locker room.

## 2017-07-26 NOTE — Consult Note (Signed)
[facility] Consult   Reason for Consult: Consult for 37 year old man who came into the emergency room last night confused and agitated with possible overdose Referring Physician: Joni Fears Patient Identification: Ian [name] MRN:  [id number] Principal Diagnosis: Sedative overdose Diagnosis:   Patient Active Problem List   Diagnosis Date Noted  . Sedative overdose [T42.71XA] 07/26/2017  . Dyslipidemia [E78.5] 11/13/2015  . Benzodiazepine overdose [T42.4X1A] 06/19/2015  . Sedative, hypnotic or anxiolytic use disorder, severe, dependence ([place]) [F13.20] 03/24/2015  . Generalized anxiety disorder [F41.1] 03/24/2015  . 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 Depression, major, recurrent, in complete remission ([place]) [F33.42] 02/09/2015  . Depression, major, recurrent, moderate ([place]) [F33.1] 02/09/2015    Total Time spent with patient: 1 hour  Subjective:   Ian [name] is a 37 y.o. male patient admitted with "I am disappointed in myself".  HPI: Patient interviewed chart reviewed.  37 year old man came to the emergency room last night via EMS.  Parents became concerned because the patient's mental status had changed he had become confused and somewhat agitated.  In the emergency room patient was described as pacing and agitated but not aggressive or threatening.  He was able to give some information eventually about taking some muscle relaxers at home.  Consistently denied suicidal thoughts.  On interview today the patient says that he is upset with himself because he abused baclofen yesterday.  He found his father's baclofen and took 3 of them.  He denies that he took any other drugs along with it.  He says he is otherwise compliant with his regularly prescribed medicine.  He says it had been a long time since he had abused sedatives or any other drugs.  He does not have any clear insight as to why yesterday.  He  reports that prior to yesterday he felt like he was doing pretty good.  He has chronic anxiety but feels like he had been coping with it.  Sleeps and eats reasonably well.  Had not been having any psychotic symptoms.  He does get outpatient treatment at [facility] and sees a counselor and is on prescription medicine for anxiety and depression.  Social history: Patient lives with his parents.  He is constrained by his chronic anxiety and mostly stays around the house.  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  Medical history: Overweight.  High blood pressure.  No other chronic medical problems other than his chronic anxiety and depression.  No recent injury reported.  Substance abuse history: Patient had a prior admission to our hospital also from overdosing on sedatives because of his anxiety.  He has some history of occasionally misusing sedative medicines although it sounds like it has never been the primary driver but rather something that happens in response to his anxiety.  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  Past Psychiatric History: One prior psychiatric admission.  No actual suicide attempts.  No history of violence or psychosis.  Chronic anxiety with panic and generalized anxiety symptoms and mild to moderate depression.  Currently on Celexa and gabapentin primarily.  Risk to Self: Suicidal Ideation: No Suicidal Intent: No Is patient at risk for suicide?: No Suicidal Plan?: No Access to Means: No What has been your use of drugs/alcohol within the last 12 months?: Reports, last use was six years ago How many times?: 0 Other Self Harm  Risks: Substance use Triggers for Past Attempts: None known Intentional Self Injurious Behavior: None Risk to Others: Homicidal Ideation: No Thoughts of Harm to Others: No Current Homicidal Intent: No Current Homicidal Plan: No Access to Homicidal Means: No Identified  Victim: Reports of none History of harm to others?: No Assessment of Violence: None Noted Violent Behavior Description: Reports of none Does patient have access to weapons?: No Criminal Charges Pending?: No Does patient have a court date: No Prior Inpatient Therapy: Prior Inpatient Therapy: Yes Prior Therapy Dates: 10/2015 & 01/2011 Prior Therapy Facilty/Provider(s): [facility] Reason for Treatment: Agoraphobia & Depression Prior Outpatient Therapy: Prior Outpatient Therapy: Yes Prior Therapy Dates: 2017 Prior Therapy Facilty/Provider(s): [facility] Reason for Treatment: Anxiety Does patient have an ACCT team?: No Does patient have Intensive In-House Services?  : No Does patient have [facility] services? : No Does patient have P4CC services?: No  Past Medical History:  Past Medical History:  Diagnosis Date  . Anxiety   . Depression   . Hypercholesteremia   . Hypertension   . Panic disorder 03/24/2015   No past surgical history on file. Family History:  Family History  Problem Relation Age of Onset  . Heart attack Mother   . Hypertension Mother   . Hypercholesterolemia Mother   . Heart attack Father   . Hypertension Father   . Hypercholesterolemia Father   . Depression Maternal Uncle   . Suicidality Maternal Uncle   . Depression Cousin   . Suicidality Cousin    Family Psychiatric  History: He had one uncle who had severe mood problems and was suicidal Social History:  Social History   Substance and Sexual Activity  Alcohol Use No  . Alcohol/week: 0.0 oz     Social History   Substance and Sexual Activity  Drug Use No    Social History   Socioeconomic History  . Marital status: Single    Spouse name: Not on file  . Number of children: Not on file  . Years of education: Not on file  . Highest education level: Not on file  Social Needs  . Financial resource strain: Not on file  . Food insecurity - worry: Not on file  . Food insecurity -  inability: Not on file  . Transportation needs - medical: Not on file  . Transportation needs - non-medical: Not on file  Occupational History  . Not on file  Tobacco Use  . Smoking status: Former Smoker    Packs/day: 0.75    Years: 16.00    Pack years: 12.00    Types: Cigarettes    Last attempt to quit: 04/05/2015    Years since quitting: 2.3  . Smokeless tobacco: Never Used  Substance and Sexual Activity  . Alcohol use: No    Alcohol/week: 0.0 oz  . Drug use: No  . Sexual activity: No  Other Topics Concern  . Not on file  Social History Narrative  . Not on file   Additional Social History:    Allergies:  No Known Allergies  Labs:  Results for orders placed or performed during the hospital encounter of 07/25/17 (from the past 48 hour(s))  CBC     Status: Abnormal   Collection Time: 07/26/17 12:10 AM  Result Value Ref Range   WBC 16.8 (H) 3.8 - 10.6 K/uL   RBC 5.05 4.40 - 5.90 MIL/uL   Hemoglobin 15.5 13.0 - 18.0 g/dL   HCT 46.7 40.0 - 52.0 %   MCV 92.4  80.0 - 100.0 fL   MCH 30.8 26.0 - 34.0 pg   MCHC 33.3 32.0 - 36.0 g/dL   RDW 14.1 11.5 - 14.5 %   Platelets 306 150 - 440 K/uL    Comment: Performed at [facility], [address] [place], [place] [address]  Comprehensive metabolic panel     Status: Abnormal   Collection Time: 07/26/17 12:10 AM  Result Value Ref Range   Sodium 143 135 - 145 mmol/L   Potassium 3.8 3.5 - 5.1 mmol/L   Chloride 107 101 - 111 mmol/L   CO2 23 22 - 32 mmol/L   Glucose, Bld 109 (H) 65 - 99 mg/dL   BUN 12 6 - 20 mg/dL   Creatinine, Ser 1.26 (H) 0.61 - 1.24 mg/dL   Calcium 9.7 8.9 - 10.3 mg/dL   Total Protein 8.2 (H) 6.5 - 8.1 g/dL   Albumin 4.5 3.5 - 5.0 g/dL   AST 26 15 - 41 U/L   ALT 19 17 - 63 U/L   Alkaline Phosphatase 70 38 - 126 U/L   Total Bilirubin 1.4 (H) 0.3 - 1.2 mg/dL   GFR calc non Af Amer >60 >60 mL/min   GFR calc Af Amer >60 >60 mL/min    Comment: (NOTE) The eGFR has been calculated using the CKD EPI  equation. This calculation has not been validated in all clinical situations. eGFR's persistently <60 mL/min signify possible Chronic Kidney Disease.    Anion gap 13 5 - 15    Comment: Performed at [facility], [address] [place], [place] [address]  Ethanol     Status: None   Collection Time: 07/26/17 12:10 AM  Result Value Ref Range   Alcohol, Ethyl (B) <10 <10 mg/dL    Comment:        LOWEST DETECTABLE LIMIT FOR SERUM ALCOHOL IS 10 mg/dL FOR MEDICAL PURPOSES ONLY Performed at [facility], [address] [place], [place] [address]   Magnesium     Status: None   Collection Time: 07/26/17 12:10 AM  Result Value Ref Range   Magnesium 1.8 1.7 - 2.4 mg/dL    Comment: Performed at [facility], [address], [place], [place] [address]  Phosphorus     Status: None   Collection Time: 07/26/17 12:10 AM  Result Value Ref Range   Phosphorus 3.6 2.5 - 4.6 mg/dL    Comment: Performed at [facility], [address] [place], [place] [address]  Salicylate level     Status: None   Collection Time: 07/26/17 12:10 AM  Result Value Ref Range   Salicylate Lvl <4.3 2.8 - 30.0 mg/dL    Comment: Performed at [facility], [address] [place], [place] [address]  Acetaminophen level     Status: Abnormal   Collection Time: 07/26/17 12:10 AM  Result Value Ref Range   Acetaminophen (Tylenol), Serum <10 (L) 10 - 30 ug/mL    Comment:        THERAPEUTIC CONCENTRATIONS VARY SIGNIFICANTLY. A RANGE OF 10-30 ug/mL MAY BE AN EFFECTIVE CONCENTRATION FOR MANY PATIENTS. HOWEVER, SOME ARE BEST TREATED AT CONCENTRATIONS OUTSIDE THIS RANGE. ACETAMINOPHEN CONCENTRATIONS >150 ug/mL AT 4 HOURS AFTER INGESTION AND >50 ug/mL AT 12 HOURS AFTER INGESTION ARE OFTEN ASSOCIATED WITH TOXIC REACTIONS. Performed at [facility], [address], [place],  [address]   Urine Drug Screen, [facility] only)     Status: Abnormal    Collection Time: 07/26/17  4:47  AM  Result Value Ref Range   Tricyclic, Ur Screen NONE DETECTED NONE DETECTED   Amphetamines, Ur Screen NONE DETECTED NONE DETECTED   MDMA (Ecstasy)Ur Screen NONE DETECTED NONE DETECTED   Cocaine Metabolite,Ur [place] NONE DETECTED NONE DETECTED   Opiate, Ur Screen NONE DETECTED NONE DETECTED   Phencyclidine (PCP) Ur S NONE DETECTED NONE DETECTED   Cannabinoid 50 Ng, Ur [place] NONE DETECTED NONE DETECTED   Barbiturates, Ur Screen NONE DETECTED NONE DETECTED   Benzodiazepine, Ur Scrn POSITIVE (A) NONE DETECTED   Methadone Scn, Ur NONE DETECTED NONE DETECTED    Comment: (NOTE) Tricyclics + metabolites, urine    Cutoff 1000 ng/mL Amphetamines + metabolites, urine  Cutoff 1000 ng/mL MDMA (Ecstasy), urine              Cutoff 500 ng/mL Cocaine Metabolite, urine          Cutoff 300 ng/mL Opiate + metabolites, urine        Cutoff 300 ng/mL Phencyclidine (PCP), urine         Cutoff 25 ng/mL Cannabinoid, urine                 Cutoff 50 ng/mL Barbiturates + metabolites, urine  Cutoff 200 ng/mL Benzodiazepine, urine              Cutoff 200 ng/mL Methadone, urine                   Cutoff 300 ng/mL The urine drug screen provides only a preliminary, unconfirmed analytical test result and should not be used for non-medical purposes. Clinical consideration and professional judgment should be applied to any positive drug screen result due to possible interfering substances. A more specific alternate chemical method must be used in order to obtain a confirmed analytical result. Gas chromatography / mass spectrometry (GC/MS) is the preferred confirmat ory method. Performed at [facility], [address] [place], [place] [address]     No current facility-administered medications for this encounter.    Current Outpatient Medications  Medication Sig Dispense Refill  . acetaminophen (TYLENOL) 325 MG tablet Take 2 tablets (650 mg total) by mouth every 6 (six) hours  as needed for mild pain (or Fever >/= 101).    Marland Kitchen atorvastatin (LIPITOR) 80 MG tablet Take 1 tablet (80 mg total) by mouth daily. 30 tablet 0  . citalopram (CELEXA) 40 MG tablet Take 1 tablet (40 mg total) by mouth daily. 30 tablet 2  . gabapentin (NEURONTIN) 300 MG capsule Take 300 mg by mouth 2 (two) times daily.    . meloxicam (MOBIC) 15 MG tablet Take 1 tablet (15 mg total) by mouth daily. 30 tablet 1  . traZODone (DESYREL) 100 MG tablet Take 2 tablets (200 mg total) by mouth at bedtime as needed for sleep. 60 tablet 2    Musculoskeletal: Strength & Muscle Tone: within normal limits Gait & Station: normal Patient leans: N/A  Psychiatric Specialty Exam: Physical Exam  Nursing note and vitals reviewed. Constitutional: He appears well-developed and well-nourished.  HENT:  Head: Normocephalic and atraumatic.  Eyes: Conjunctivae are normal. Pupils are equal, round, and reactive to light.  Neck: Normal range of motion.  Cardiovascular: Regular rhythm and normal heart sounds.  Respiratory: Effort normal. No respiratory distress.  GI: Soft.  Musculoskeletal: Normal range of motion.  Neurological: He is alert.  Skin: Skin is warm and dry.  Psychiatric: Judgment normal. His affect is blunt. His speech is delayed. He is slowed. Thought  content is not paranoid. He expresses no homicidal and no suicidal ideation. He exhibits abnormal recent memory.    Review of Systems  Constitutional: Negative.   HENT: Negative.   Eyes: Negative.   Respiratory: Negative.   Cardiovascular: Negative.   Gastrointestinal: Negative.   Musculoskeletal: Negative.   Skin: Negative.   Neurological: Negative.   Psychiatric/Behavioral: Positive for memory loss and substance abuse. Negative for depression, hallucinations and suicidal ideas. The patient is nervous/anxious. The patient does not have insomnia.     Blood pressure (!) 147/103, pulse (!) 103, temperature 99.8 F (37.7 C), temperature source Oral,  resp. rate 18, height 6' 5"  (1.956 m), weight (!) 158.8 kg (350 lb), SpO2 98 %.Body mass index is 41.5 kg/m.  General Appearance: Disheveled  Eye Contact:  Good  Speech:  Normal Rate  Volume:  Decreased  Mood:  Dysphoric  Affect:  Full Range  Thought Process:  Goal Directed  Orientation:  Full (Time, Place, and Person)  Thought Content:  Logical  Suicidal Thoughts:  No  Homicidal Thoughts:  No  Memory:  Immediate;   Good Recent;   Fair Remote;   Poor  Judgement:  Fair  Insight:  Fair  Psychomotor Activity:  Decreased  Concentration:  Concentration: Fair  Recall:  [employer] of Knowledge:  Fair  Language:  Fair  Akathisia:  No  Handed:  Right  AIMS (if indicated):     Assets:  Communication Skills Desire for Improvement Housing Resilience Social Support  ADL's:  Intact  Cognition:  Impaired,  Mild  Sleep:        Treatment Plan Summary: Plan 37 year old man with a history of anxiety depression and sedative abuse took an overdose of baclofen yesterday not for any evidence suicidal reason but because he has a history of abusing that medicine and was feeling anxious and impulsive.  Patient does not have any suicidality evidenced now.  No need for inpatient hospitalization.  Discontinue IVC.  Did some supportive counseling and education about substance abuse and mental health treatment.  No change to current medicine.  Case reviewed with TTS and emergency room physician.  Patient can be discharged to follow-up with [facility].  He is still slightly confused on a few matters during our conversation sort of like somebody who is just waking up but that seems to just be the lingering effects of the sedative which I imagine will go away shortly.  Disposition: No evidence of imminent risk to self or others at present.   Patient does not meet criteria for psychiatric inpatient admission. Supportive therapy provided about ongoing stressors. Discussed crisis plan, support from social  network, calling 911, coming to the Emergency Department, and calling Suicide Hotline.  Alethia Berthold, MD 07/26/2017 1:45 PM

## 2017-07-26 NOTE — ED Notes (Signed)
Pt. Taken to CT without incidence, pt. Unable to stay still enough for good pictures.

## 2017-07-26 NOTE — ED Notes (Signed)
Pt. Has made no further attempts to get out of bed without asking for assistance.  Pt. Agrees to call out if he needs help or if he has question about care.

## 2017-07-26 NOTE — BH Assessment (Signed)
Assessment Note  Ian [name] is an 37 y.o. male who  presents to the ER due to taking an overdose of his father's medications. Per the report of the patient, he wasn't trying to end his life. "I messed up. I wanted to get high." He further reports, this has happened in the past but not with his father's medication.    During the interview, the patient was calm, cooperative and pleasant. He was able to answer majority of the questions. However, at times he was having thought blocking. When asked if he was having difficulty gathering and/or sharing his thoughts, he stated no and then shared it was common for him.  During the initial part of the interview, the patient was guarded and provided limited answers. After writer reiterated the purpose of TTS seeing him and his answers help guide the decision for inpatient or outpatient, he apologized and stated he was embarrassed about why he was brought to the ER.  He was inpatient with [facility] 10/2015 & 01/2011 for Agoraphobia with panic disorder and depression. Per his chart, he has visit the ER for similar presentation due to taking more medications than what was prescribed. He denies the use of any other mind-altering substance. He reports alcohol and cannabis was last used six years ago. He also denies any involvement with the legal system and no history of aggression or violence.  Throughout the interview, patient denied SI/HI and AV/H.   Diagnosis: Medication Overdose  Past Medical History:  Past Medical History:  Diagnosis Date  . Anxiety   . Depression   . Hypercholesteremia   . Hypertension   . Panic disorder 03/24/2015    No past surgical history on file.  Family History:  Family History  Problem Relation Age of Onset  . Heart attack Mother   . Hypertension Mother   . Hypercholesterolemia Mother   . Heart attack Father   . Hypertension Father   . Hypercholesterolemia Father   . Depression Maternal Uncle   . Suicidality Maternal  Uncle   . Depression Cousin   . Suicidality Cousin     Social History:  reports that he quit smoking about 2 years ago. His smoking use included cigarettes. He has a 12.00 pack-year smoking history. he has never used smokeless tobacco. He reports that he does not drink alcohol or use drugs.  Additional Social History:  Alcohol / Drug Use Pain Medications: See PTA Prescriptions: See PTA Over the Counter: See PTA History of alcohol / drug use?: Yes Longest period of sobriety (when/how long): Six years Substance #1 Name of Substance 1: Alcohol 1 - Last Use / Amount: "Six years ago." Substance #2 Name of Substance 2: Cannabis 2 - Last Use / Amount: "Six years ago."  CIWA: CIWA-Ar BP: 122/67 Pulse Rate: 81 COWS:    Allergies: No Known Allergies  Home Medications:  (Not in a hospital admission)  OB/GYN Status:  No LMP for male patient.  General Assessment Data [facility] Assessment: In system Is this a Tele or Face-to-Face Assessment?: Face-to-Face Is this an Initial Assessment or a Re-assessment for this encounter?: Initial Assessment Marital status: Single Maiden name: n/a Is patient pregnant?: No Pregnancy Status: No Living Arrangements: Parent Can pt return to current living arrangement?: Yes Admission Status: Involuntary Is patient capable of signing voluntary admission?: No(under IVC) Referral Source: Self/Family/Friend Insurance type: None  Medical Screening Exam [facility] Walk-in ONLY) Medical Exam completed: Yes  Crisis Care Plan Living Arrangements:  Parent Legal Guardian: Other:(Self, per patient) Name of Psychiatrist: Reports of none Name of Therapist: Reports of none  Education Status Is patient currently in school?: No Is the patient employed, unemployed or receiving disability?: Unemployed  Risk to self with the past 6 months Suicidal Ideation: No Has patient been a risk to self within the past 6 months prior to admission? :  No Suicidal Intent: No Has patient had any suicidal intent within the past 6 months prior to admission? : No Is patient at risk for suicide?: No Suicidal Plan?: No Has patient had any suicidal plan within the past 6 months prior to admission? : No Access to Means: No What has been your use of drugs/alcohol within the last 12 months?: Reports, last use was six years ago Previous Attempts/Gestures: No How many times?: 0 Other Self Harm Risks: Substance use Triggers for Past Attempts: None known Intentional Self Injurious Behavior: None Family Suicide History: No Recent stressful life event(s): Other (Comment) Persecutory voices/beliefs?: No Depression: No Depression Symptoms: Guilt Substance abuse history and/or treatment for substance abuse?: Yes Suicide prevention information given to non-admitted patients: Not applicable  Risk to Others within the past 6 months Homicidal Ideation: No Does patient have any lifetime risk of violence toward others beyond the six months prior to admission? : No Thoughts of Harm to Others: No Current Homicidal Intent: No Current Homicidal Plan: No Access to Homicidal Means: No Identified Victim: Reports of none History of harm to others?: No Assessment of Violence: None Noted Violent Behavior Description: Reports of none Does patient have access to weapons?: No Criminal Charges Pending?: No Does patient have a court date: No Is patient on probation?: No  Psychosis Hallucinations: None noted Delusions: None noted  Mental Status Report Appearance/Hygiene: Unremarkable, In scrubs Eye Contact: Fair Motor Activity: Freedom of movement, Unremarkable Speech: Logical/coherent, Soft Level of Consciousness: Alert Mood: Anxious, Pleasant Affect: Appropriate to circumstance Anxiety Level: Minimal Thought Processes: Relevant, Thought Blocking Judgement: Unimpaired Orientation: Person, Place, Time, Situation, Appropriate for developmental  age Obsessive Compulsive Thoughts/Behaviors: Minimal  Cognitive Functioning Concentration: Normal Memory: Recent Intact, Remote Intact Is patient IDD: No Is patient DD?: No Insight: Fair Impulse Control: Fair Appetite: Good Have you had any weight changes? : No Change Sleep: No Change Total Hours of Sleep: 0 Vegetative Symptoms: None  ADLScreening [facility] Services) Patient's cognitive ability adequate to safely complete daily activities?: Yes Patient able to express need for assistance with ADLs?: Yes Independently performs ADLs?: Yes (appropriate for developmental age)  Prior Inpatient Therapy Prior Inpatient Therapy: Yes Prior Therapy Dates: 10/2015 & 01/2011 Prior Therapy Facilty/Provider(s): [facility] Reason for Treatment: Agoraphobia & Depression  Prior Outpatient Therapy Prior Outpatient Therapy: Yes Prior Therapy Dates: 2017 Prior Therapy Facilty/Provider(s): [facility] Reason for Treatment: Anxiety Does patient have an ACCT team?: No Does patient have Intensive In-House Services?  : No Does patient have [facility] services? : No Does patient have P4CC services?: No  ADL Screening (condition at time of admission) Patient's cognitive ability adequate to safely complete daily activities?: Yes Is the patient deaf or have difficulty hearing?: No Does the patient have difficulty seeing, even when wearing glasses/contacts?: No Does the patient have difficulty concentrating, remembering, or making decisions?: No Patient able to express need for assistance with ADLs?: Yes Does the patient have difficulty dressing or bathing?: No Independently performs ADLs?: Yes (appropriate for developmental age) Does the patient have difficulty walking or climbing stairs?: No Weakness of Legs: None Weakness of Arms/Hands: None  Home Assistive Devices/Equipment Home Assistive Devices/Equipment: None  Therapy Consults (therapy consults require a physician  order) PT Evaluation Needed: No OT Evalulation Needed: No SLP Evaluation Needed: No Abuse/Neglect Assessment (Assessment to be complete while patient is alone) Abuse/Neglect Assessment Can Be Completed: Yes Physical Abuse: Denies Verbal Abuse: Denies Sexual Abuse: Denies Exploitation of patient/patient's resources: Denies Self-Neglect: Denies Values / Beliefs Cultural Requests During Hospitalization: None Spiritual Requests During Hospitalization: None Consults Spiritual Care Consult Needed: No Social Work Consult Needed: No      Additional Information 1:1 In Past 12 Months?: No CIRT Risk: No Elopement Risk: No Does patient have medical clearance?: Yes  Child/Adolescent Assessment Running Away Risk: Denies(Patient is an adult)  Disposition:  Disposition Initial Assessment Completed for this Encounter: Yes  On Site Evaluation by:   Reviewed with Physician:    Lilyan Gilford MS, LCAS, LPC, NCC, CCSI Therapeutic Triage Specialist 07/26/2017 11:36 AM

## 2019-07-02 ENCOUNTER — Emergency Department: Payer: Self-pay

## 2019-07-02 ENCOUNTER — Encounter: Payer: Self-pay | Admitting: Emergency Medicine

## 2019-07-02 ENCOUNTER — Other Ambulatory Visit: Payer: Self-pay

## 2019-07-02 ENCOUNTER — Emergency Department
Admission: EM | Admit: 2019-07-02 | Discharge: 2019-07-02 | Disposition: A | Discharge status: Home / Self Care | Payer: Self-pay | Attending: Emergency Medicine | Admitting: Emergency Medicine

## 2019-07-02 DIAGNOSIS — E871 Hypo-osmolality and hyponatremia: Secondary | ICD-10-CM

## 2019-07-02 DIAGNOSIS — Z79899 Other long term (current) drug therapy: Secondary | ICD-10-CM | POA: Insufficient documentation

## 2019-07-02 DIAGNOSIS — E876 Hypokalemia: Secondary | ICD-10-CM | POA: Insufficient documentation

## 2019-07-02 DIAGNOSIS — I10 Essential (primary) hypertension: Secondary | ICD-10-CM | POA: Insufficient documentation

## 2019-07-02 DIAGNOSIS — R079 Chest pain, unspecified: Secondary | ICD-10-CM

## 2019-07-02 DIAGNOSIS — Z87891 Personal history of nicotine dependence: Secondary | ICD-10-CM | POA: Insufficient documentation

## 2019-07-02 DIAGNOSIS — R0789 Other chest pain: Secondary | ICD-10-CM | POA: Insufficient documentation

## 2019-07-02 LAB — HEPATIC FUNCTION PANEL
ALT: 20 U/L (ref 0–44)
AST: 16 U/L (ref 15–41)
Albumin: 4.9 g/dL (ref 3.5–5.0)
Alkaline Phosphatase: 60 U/L (ref 38–126)
Bilirubin, Direct: <0.1 mg/dL (ref 0.0–0.2)
Total Bilirubin: 0.9 mg/dL (ref 0.3–1.2)
Total Protein: 7.4 g/dL (ref 6.5–8.1)

## 2019-07-02 LAB — BASIC METABOLIC PANEL
Anion gap: 11 (ref 5–15)
BUN: 17 mg/dL (ref 6–20)
CO2: 22 mmol/L (ref 22–32)
Calcium: 9.6 mg/dL (ref 8.9–10.3)
Chloride: 94 mmol/L — ABNORMAL LOW (ref 98–111)
Creatinine, Ser: 1 mg/dL (ref 0.61–1.24)
GFR calc Af Amer: >60 mL/min (ref >60)
GFR calc non Af Amer: >60 mL/min (ref >60)
Glucose, Bld: 91 mg/dL (ref 70–99)
Potassium: 4.5 mmol/L (ref 3.5–5.1)
Sodium: 127 mmol/L — ABNORMAL LOW (ref 135–145)

## 2019-07-02 LAB — CBC
HCT: 42 % (ref 39.0–52.0)
Hemoglobin: 14.9 g/dL (ref 13.0–17.0)
MCH: 31.2 pg (ref 26.0–34.0)
MCHC: 35.5 g/dL (ref 30.0–36.0)
MCV: 88.1 fL (ref 80.0–100.0)
Platelets: 233 10*3/uL (ref 150–400)
RBC: 4.77 MIL/uL (ref 4.22–5.81)
RDW: 11.9 % (ref 11.5–15.5)
WBC: 6.3 10*3/uL (ref 4.0–10.5)
nRBC: 0 % (ref 0.0–0.2)

## 2019-07-02 LAB — LIPASE, BLOOD: Lipase: 29 U/L (ref 11–51)

## 2019-07-02 LAB — TROPONIN I (HIGH SENSITIVITY)
Troponin I (High Sensitivity): 3 ng/L (ref <18)
Troponin I (High Sensitivity): 3 ng/L (ref <18)

## 2019-07-02 MED ORDER — LIDOCAINE 5 % EX PTCH
1.0000 | MEDICATED_PATCH | CUTANEOUS | Status: DC
  Administered 2019-07-02: 1 via TRANSDERMAL
  Filled 2019-07-02 (×2): qty 1

## 2019-07-02 MED ORDER — OXYCODONE HCL 5 MG PO TABS
5.0000 mg | ORAL_TABLET | Freq: Once | ORAL | Status: AC
  Administered 2019-07-02: 5 mg via ORAL
  Filled 2019-07-02: qty 1

## 2019-07-02 MED ORDER — KETOROLAC TROMETHAMINE 30 MG/ML IJ SOLN
30.0000 mg | Freq: Once | INTRAMUSCULAR | Status: AC
  Administered 2019-07-02: 15:00:00 30 mg via INTRAMUSCULAR
  Filled 2019-07-02: qty 1

## 2019-07-02 MED ORDER — SODIUM CHLORIDE 0.9 % IV BOLUS
1000.0000 mL | Freq: Once | INTRAVENOUS | Status: AC
  Administered 2019-07-02: 16:00:00 1000 mL via INTRAVENOUS

## 2019-07-02 MED ORDER — IBUPROFEN 800 MG PO TABS: 800.0000 mg | ORAL_TABLET | Freq: Three times a day (TID) | ORAL | 0 refills | Status: AC | PRN

## 2019-07-02 MED ORDER — ACETAMINOPHEN 500 MG PO TABS
1000.0000 mg | ORAL_TABLET | Freq: Once | ORAL | Status: AC
  Administered 2019-07-02: 1000 mg via ORAL
  Filled 2019-07-02: qty 2

## 2019-07-02 MED ORDER — CYCLOBENZAPRINE HCL 10 MG PO TABS
5.0000 mg | ORAL_TABLET | Freq: Once | ORAL | Status: AC
  Administered 2019-07-02: 15:00:00 5 mg via ORAL
  Filled 2019-07-02: qty 1

## 2019-07-02 MED ORDER — CYCLOBENZAPRINE HCL 5 MG PO TABS: 5.0000 mg | ORAL_TABLET | Freq: Three times a day (TID) | ORAL | 0 refills | Status: AC | PRN

## 2019-07-02 MED ORDER — GABAPENTIN 100 MG PO CAPS: 400.0000 mg | ORAL_CAPSULE | Freq: Three times a day (TID) | ORAL | 0 refills | Status: DC

## 2019-07-02 NOTE — Discharge Instructions (Addendum)
You should have your sodium level rechecked in 1 week.  It was low at 127.  You should decrease how much water you are in taking because this can cause you to dilute your sodium.  You should increase your salt in your diet.  For your pain you should take the ibuprofen with food, Tylenol 1 g every 8 hours, Flexeril as needed but do not drive while on this, increase her gabapentin.  Return to the ER for worsening pain, shortness of breath or any other concerns

## 2019-07-02 NOTE — ED Provider Notes (Signed)
[facility] Emergency Department Provider Note  ____________________________________________   First MD Initiated Contact with Patient 07/02/19 1358     (approximate)  I have reviewed the triage vital signs and the nursing notes.   HISTORY  Chief Complaint Chest Pain    HPI Ian [name] is a 39 y.o. male with anxiety, depression, hypertension, hyperlipidemia who comes in with chest pain.  Patient is having pain on his left mid axillary left chest for 1 year but worse over the past 5 days.  Patient saw his PCP yesterday was given a lidocaine patch.  Patient states he is unemployed and does not remember straining anything.  Patient states that the pain has been constantly there for a year but seems to worsen over the past few days.  The pain is located underneath his left nipple.   He states the pain is worse when he lays down or sits down and better when he stands up.  States it feels like a pinched nerve.  He states it feels better when he pushes down on it.  The pain is directly underneath his nipple but then will occasionally radiate either midsternal or a little bit towards his side of his chest.  Denies any back pain, urinary symptoms.  Denies any shortness of breath.  Patient states that his primary care doctor thought it was musculoskeletal but how could your muscles been hurting for over a year.            Past Medical History:  Diagnosis Date  . Anxiety   . Depression   . Hypercholesteremia   . Hypertension   . Panic disorder 03/24/2015    Patient Active Problem List   Diagnosis Date Noted  . Sedative overdose 07/26/2017  . Dyslipidemia 11/13/2015  . Benzodiazepine overdose 06/19/2015  . 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 Generalized anxiety disorder 03/24/2015  . Alcohol abuse, in remission 03/24/2015  . 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 Depression, major, recurrent, in  complete remission (HCC) 02/09/2015  . Depression, major, recurrent, moderate (HCC) 02/09/2015    History reviewed. No pertinent surgical history.  Prior to Admission medications   Medication Sig Start Date End Date Taking? Authorizing Provider  acetaminophen (TYLENOL) 325 MG tablet Take 2 tablets (650 mg total) by mouth every 6 (six) hours as needed for mild pain (or Fever >/= 101). 06/21/15   Ramonita Lab, MD  atorvastatin (LIPITOR) 80 MG tablet Take 1 tablet (80 mg total) by mouth daily. 11/15/15   Pucilowska, Braulio Conte B, MD  citalopram (CELEXA) 40 MG tablet Take 1 tablet (40 mg total) by mouth daily. 11/15/15   Pucilowska, Jolanta B, MD  gabapentin (NEURONTIN) 300 MG capsule Take 300 mg by mouth 2 (two) times daily.    [provider]  meloxicam (MOBIC) 15 MG tablet Take 1 tablet (15 mg total) by mouth daily. 11/15/15   Pucilowska, Braulio Conte B, MD  traZODone (DESYREL) 100 MG tablet Take 2 tablets (200 mg total) by mouth at bedtime as needed for sleep. 11/15/15   Pucilowska, Ellin Goodie, MD    Allergies Patient has no known allergies.  Family History  Problem Relation Age of Onset  . Heart attack Mother   . Hypertension Mother   . Hypercholesterolemia Mother   . Heart attack Father   . Hypertension Father   . Hypercholesterolemia Father   . Depression Maternal Uncle   . Suicidality Maternal Uncle   . Depression Cousin   .  Suicidality Cousin     Social History Social History   Tobacco Use  . Smoking status: Former Smoker    Packs/day: 0.75    Years: 16.00    Pack years: 12.00    Types: Cigarettes    Quit date: 04/05/2015    Years since quitting: 4.2  . Smokeless tobacco: Never Used  Substance Use Topics  . Alcohol use: No    Alcohol/week: 0.0 standard drinks  . Drug use: No      Review of Systems Constitutional: No fever/chills Eyes: No visual changes. ENT: No sore throat. Cardiovascular: Positive chest pain Respiratory: Denies shortness of  breath. Gastrointestinal: No abdominal pain.  No nausea, no vomiting.  No diarrhea.  No constipation. Genitourinary: Negative for dysuria. Musculoskeletal: Negative for back pain. Skin: Negative for rash. Neurological: Negative for headaches, focal weakness or numbness. All other ROS negative ____________________________________________   PHYSICAL EXAM:  VITAL SIGNS: ED Triage Vitals  Enc Vitals Group     BP 07/02/19 1345 133/88     Pulse Rate 07/02/19 1345 80     Resp 07/02/19 1345 18     Temp 07/02/19 1345 98.2 F (36.8 C)     Temp Source 07/02/19 1345 Oral     SpO2 07/02/19 1345 100 %     Weight 07/02/19 1346 252 lb (114.3 kg)     Height 07/02/19 1346 6' (1.829 m)     Head Circumference --      Peak Flow --      Pain Score --      Pain Loc --      Pain Edu? --      Excl. in [place]? --     Constitutional: Alert and oriented. Well appearing and in no acute distress. Eyes: Conjunctivae are normal. EOMI. Head: Atraumatic. Nose: No congestion/rhinnorhea. Mouth/Throat: Mucous membranes are moist.   Neck: No stridor. Trachea Midline. FROM Cardiovascular: Normal rate, regular rhythm. Grossly normal heart sounds.  Good peripheral circulation.  Mild tenderness palpation underneath the left nipple.  No rash noted Respiratory: Normal respiratory effort.  No retractions. Lungs CTAB. Gastrointestinal: Soft and nontender. No distention. No abdominal bruits.  Musculoskeletal: No lower extremity tenderness nor edema.  No joint effusions. Neurologic:  Normal speech and language. No gross focal neurologic deficits are appreciated.  Skin:  Skin is warm, dry and intact. No rash noted. Psychiatric: Mood and affect are normal. Speech and behavior are normal. GU: Deferred   ____________________________________________   LABS (all labs ordered are listed, but only abnormal results are displayed)  Labs Reviewed  BASIC METABOLIC PANEL - Abnormal; Notable for the following components:       Result Value   Sodium 127 (*)    Chloride 94 (*)    All other components within normal limits  CBC  HEPATIC FUNCTION PANEL  LIPASE, BLOOD  TROPONIN I (HIGH SENSITIVITY)  TROPONIN I (HIGH SENSITIVITY)   ____________________________________________   ED ECG REPORT I, Vanessa [place], the attending physician, personally viewed and interpreted this ECG.  EKG is normal sinus rate of 61, no ST elevation, no T wave inversions, normal intervals ____________________________________________  RADIOLOGY Robert Bellow, personally viewed and evaluated these images (plain radiographs) as part of my medical decision making, as well as reviewing the written report by the radiologist.  ED MD interpretation: No pneumonia or pneumothorax  Official radiology report(s): DG Chest 2 View  Result Date: 07/02/2019 CLINICAL DATA:  Left mid axillary and left chest pain for 1 year,  worse over the past 5 days. EXAM: CHEST - 2 VIEW COMPARISON:  Single-view of the chest 06/19/2015. FINDINGS: Lungs clear. Heart size normal. No pneumothorax or pleural fluid. No acute or focal bony abnormality. IMPRESSION: Negative chest. Electronically Signed   By: Drusilla Kanner M.D.   On: 07/02/2019 14:16    ____________________________________________   PROCEDURES  Procedure(s) performed (including Critical Care):  Procedures   ____________________________________________   INITIAL IMPRESSION / ASSESSMENT AND PLAN / ED COURSE   KAID SEEBERGER was evaluated in Emergency Department on 07/02/2019 for the symptoms described in the history of present illness. He was evaluated in the context of the global COVID-19 pandemic, which necessitated consideration that the patient might be at risk for infection with the SARS-CoV-2 virus that causes COVID-19. Institutional protocols and algorithms that pertain to the evaluation of patients at risk for COVID-19 are in a state of rapid change based on information released by regulatory  bodies including the CDC and federal and state organizations. These policies and algorithms were followed during the patient's care in the ED.    Most Likely DDx:  -MSK (atypical chest pain) but will get cardiac markers to evaluate for ACS given risk factors/age -Patient is questioning how muscle pain can last 1 year.  I explained to patient that this makes me think that it is less emergent given he is still alive after 1 year of it.  Patient states he feels like a nerve is pinched but I explained to patient that there is no test I can do to diagnose that.  We discussed symptomatic management if work-up is negative.  DDx that was also considered d/t potential to cause harm, but was found less likely based on history and physical (as detailed above): -PNA (no fevers, cough but CXR to evaluate) -PNX (reassured with equal b/l breath sounds, CXR to evaluate) -Symptomatic anemia (will get H&H) -Pulmonary embolism as no sob at rest, not pleuritic in nature, no hypoxia, PERC negative -Aortic Dissection as no tearing pain and no radiation to the mid back, pulses equal this is been going on for so long.  Patient is a hypertensive. -Pericarditis no rub on exam, EKG changes or hx to suggest dx -Tamponade (no notable SOB, tachycardic, hypotensive) -Esophageal rupture (no h/o diffuse vomitting/no crepitus)  Patient's labs are negative for ACS.  Notes anemia.  No white count to suggest infection.  His sodium is slightly low at 127.  And I do not think this is the cause of his symptoms.  Chest x-ray had no evidence of mass to suggest SIADH. I given him a liter of fluid.  Patient states his pain is now manageable after some pain medications.  We discussed increasing his gabapentin considering him home on ibuprofen, Tylenol, Flexeril.  Upon further discussion with patient he states he drinks 3 gallons of water daily.  I discussed with patient that he needs to cut down on this that could be causing his low sodium  and increase his salt intake.  I discussed with him following up with his primary doctor within 1 week for a repeat sodium to make sure that is not going lower.  Patient expressed understanding.  We also discussed following up with them if he continues have the pain and return to the ER if he develops any worsening symptoms.  I discussed the provisional nature of ED diagnosis, the treatment so far, the ongoing plan of care, follow up appointments and return precautions with the patient and any family or  support people present. They expressed understanding and agreed with the plan, discharged home.   _________________   FINAL CLINICAL IMPRESSION(S) / ED DIAGNOSES   Final diagnoses:  Chest pain, unspecified type  Hyponatremia     MEDICATIONS GIVEN DURING THIS VISIT:  Medications  lidocaine (LIDODERM) 5 % 1 patch (1 patch Transdermal Patch Applied 07/02/19 1440)  acetaminophen (TYLENOL) tablet 1,000 mg (1,000 mg Oral Given 07/02/19 1438)  ketorolac (TORADOL) 30 MG/ML injection 30 mg (30 mg Intramuscular Given 07/02/19 1439)  cyclobenzaprine (FLEXERIL) tablet 5 mg (5 mg Oral Given 07/02/19 1438)  sodium chloride 0.9 % bolus 1,000 mL (1,000 mLs Intravenous New Bag/Given 07/02/19 1540)  oxyCODONE (Oxy IR/ROXICODONE) immediate release tablet 5 mg (5 mg Oral Given 07/02/19 1536)     ED Discharge Orders         Ordered    ibuprofen (ADVIL) 800 MG tablet  Every 8 hours PRN     07/02/19 1732    cyclobenzaprine (FLEXERIL) 5 MG tablet  3 times daily PRN     07/02/19 1732    gabapentin (NEURONTIN) 100 MG capsule  3 times daily     07/02/19 1732           Note:  This document was prepared using Dragon voice recognition software and may include unintentional dictation errors.   Concha Se, MD 07/02/19 1754

## 2019-07-02 NOTE — ED Triage Notes (Signed)
Pt has had a left mid axillary and left chest pain for 1 year but worse over last 5 days. Pain improves with movement and is worse when still.  NAD. VSS. Saw pcp yesterday and given lidocaine patch.

## 2019-07-02 NOTE — ED Notes (Signed)
Patient being transported to Xray

## 2021-01-15 ENCOUNTER — Other Ambulatory Visit: Payer: Self-pay

## 2021-01-15 ENCOUNTER — Ambulatory Visit
Admission: EM | Admit: 2021-01-15 | Discharge: 2021-01-15 | Disposition: A | Discharge status: Home / Self Care | Payer: Self-pay | Attending: Family Medicine | Admitting: Family Medicine

## 2021-01-15 DIAGNOSIS — S61212A Laceration without foreign body of right middle finger without damage to nail, initial encounter: Secondary | ICD-10-CM

## 2021-01-15 MED ORDER — TETANUS-DIPHTHERIA TOXOIDS TD 5-2 LFU IM INJ
0.5000 mL | INJECTION | Freq: Once | INTRAMUSCULAR | Status: AC
  Administered 2021-01-15: 0.5 mL via INTRAMUSCULAR

## 2021-01-15 NOTE — Discharge Instructions (Addendum)
Keep the wound clean.  Topical Vaseline or antibiotic ointment daily.  Sutures in 7 days.  Take care  Dr. Adriana Simas

## 2021-01-15 NOTE — ED Triage Notes (Signed)
Pt c/o laceration to his right middle finger with an aluminum can today. Pt did put quick-clot in the wound to stop the bleeding. Pt does not recall last tetanus.

## 2021-01-17 NOTE — ED Provider Notes (Signed)
[facility]    CSN: [id number] Arrival date & time: 01/15/21  1630  History   Chief Complaint Chief Complaint  Patient presents with   Laceration    R middle finger   HPI  40 year old male presents with the above complaint.  Patient cut his right middle finger on an aluminum can today.  He reports difficulty managing the bleeding.  He used a "quick clot" which has resolved the bleeding.  He is unsure of his last tetanus.  Pain 6/10 in severity.  No other associated symptoms.  No other complaints.  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 Authorizing Provider  buPROPion (WELLBUTRIN XL) 150 MG 24 hr tablet Take 150 mg by mouth daily. 12/03/20  Yes [provider]  citalopram (CELEXA) 40 MG tablet Take 1 tablet (40 mg total) by mouth daily. 11/15/15  Yes Pucilowska, Jolanta B, MD  gabapentin (NEURONTIN) 300 MG capsule Take 300 mg by mouth 3 (three) times daily. 12/03/20  Yes [provider]  hydrOXYzine (ATARAX/VISTARIL) 50 MG tablet Take 50 mg by mouth 3 (three) times daily as needed. 09/14/20  Yes [provider]  traZODone (DESYREL) 100 MG tablet Take 2 tablets (200 mg total) by mouth at bedtime as needed for sleep. 11/15/15  Yes Pucilowska, Jolanta B, MD   acetaminophen (TYLENOL) 325 MG tablet Take 2 tablets (650 mg total) by mouth every 6 (six) hours as needed for mild pain (or Fever >/= 101). 06/21/15   Ramonita Lab, MD    Family History Family History  Problem Relation Age of Onset   Heart attack Mother    Hypertension Mother    Hypercholesterolemia Mother    Heart attack Father    Hypertension Father    Hypercholesterolemia Father    Depression Maternal Uncle    Suicidality Maternal Uncle    Depression Cousin    Suicidality Cousin     Social History Social History   Tobacco Use   Smoking status: Former    Packs/day: 0.75    Years: 16.00    Pack years: 12.00    Types: Cigarettes    Quit date: 04/05/2015    Years since quitting: 5.7   Smokeless tobacco: Never  Vaping Use   Vaping Use: Never used  Substance Use Topics   Alcohol use: No    Alcohol/week: 0.0 standard drinks   Drug use: No     Allergies   Patient has no known allergies.   Review of Systems Review of Systems  Constitutional: Negative.   Skin:  Positive for wound.   Physical Exam Triage Vital Signs ED Triage Vitals  Enc Vitals Group     BP 01/15/21 1648 (!) 135/93     Pulse Rate 01/15/21 1648 77     Resp 01/15/21 1648 18  Temp 01/15/21 1648 98.4 F (36.9 C)     Temp Source 01/15/21 1648 Oral     SpO2 01/15/21 1648 99 %     Weight 01/15/21 1645 255 lb (115.7 kg)     Height 01/15/21 1645 6' (1.829 m)     Head Circumference --      Peak Flow --      Pain Score 01/15/21 1645 6     Pain Loc --      Pain Edu? --      Excl. in GC? --    Updated Vital Signs BP (!) 135/93 (BP Location: Left Arm)   Pulse 77   Temp 98.4 F (36.9 C) (Oral)   Resp 18   Ht 6' (1.829 m)   Wt 115.7 kg   SpO2 99%   BMI 34.58 kg/m   Visual Acuity Right Eye Distance:   Left Eye Distance:   Bilateral Distance:    Right Eye Near:   Left Eye Near:    Bilateral Near:     Physical Exam Vitals and nursing note reviewed.  Constitutional:      General:  He is not in acute distress.    Appearance: Normal appearance. He is not ill-appearing.  HENT:     Head: Normocephalic and atraumatic.  Eyes:     General:        Right eye: No discharge.        Left eye: No discharge.     Conjunctiva/sclera: Conjunctivae normal.  Pulmonary:     Effort: Pulmonary effort is normal. No respiratory distress.  Skin:    Comments: Curvilinear laceration to the tip of the right middle finger.  Neurological:     Mental Status: He is alert.  Psychiatric:        Mood and Affect: Mood normal.        Behavior: Behavior normal.     UC Treatments / Results  Labs (all labs ordered are listed, but only abnormal results are displayed) Labs Reviewed - No data to display  EKG   Radiology No results found.  Procedures Laceration Repair  Date/Time: 01/17/2021 8:04 AM Performed by: Tommie Sams, DO Authorized by: Tommie Sams, DO   Consent:    Consent obtained:  Verbal   Consent given by:  Patient Laceration details:    Location:  Finger   Finger location:  R long finger   Length (cm):  2 Pre-procedure details:    Preparation:  Patient was prepped and draped in usual sterile fashion Exploration:    Hemostasis achieved with:  Tourniquet   Contaminated: no   Treatment:    Area cleansed with:  Povidone-iodine   Amount of cleaning:  Standard Skin repair:    Repair method:  Sutures   Suture size:  5-0   Suture material:  Prolene   Suture technique:  Simple interrupted   Number of sutures:  3 Approximation:    Approximation:  Close Repair type:    Repair type:  Simple Post-procedure details:    Dressing:  Antibiotic ointment and non-adherent dressing   Procedure completion:  Tolerated well, no immediate complications (including critical care time)  Medications Ordered in UC Medications  tetanus & diphtheria toxoids (adult) (TENIVAC) injection 0.5 mL (0.5 mLs Intramuscular Given 01/15/21 1741)    Initial Impression / Assessment and Plan /  UC Course  I have reviewed the triage vital signs and the nursing notes.  Pertinent labs & imaging results that were available during  my care of the patient were reviewed by me and considered in my medical decision making (see chart for details).    40 year old male presents with a laceration to his right middle finger.  Repaired as above.  Sutures out in 7 days.  Tetanus was given today.  Final Clinical Impressions(s) / UC Diagnoses   Final diagnoses:  Laceration of right middle finger without foreign body without damage to nail, initial encounter     Discharge Instructions      Keep the wound clean.  Topical Vaseline or antibiotic ointment daily.  Sutures in 7 days.  Take care  Dr. Adriana Simas    ED Prescriptions   None    PDMP not reviewed this encounter.   Everlene Other [place], [place] 01/17/21 [phone]

## 2021-11-03 IMAGING — CR DG CHEST 2V
1 series · 2 of 2 positions shown · non-contrast
Comparison: Single-view of the chest 06/19/2015.

CLINICAL DATA: Left mid axillary and left chest pain for 1 year,
worse over the past 5 days.

EXAM:
CHEST - 2 VIEW

[Series 1: dg chest 2 view · 0.14mm/px · 2 of 2 slices shown]
[im 1/2]
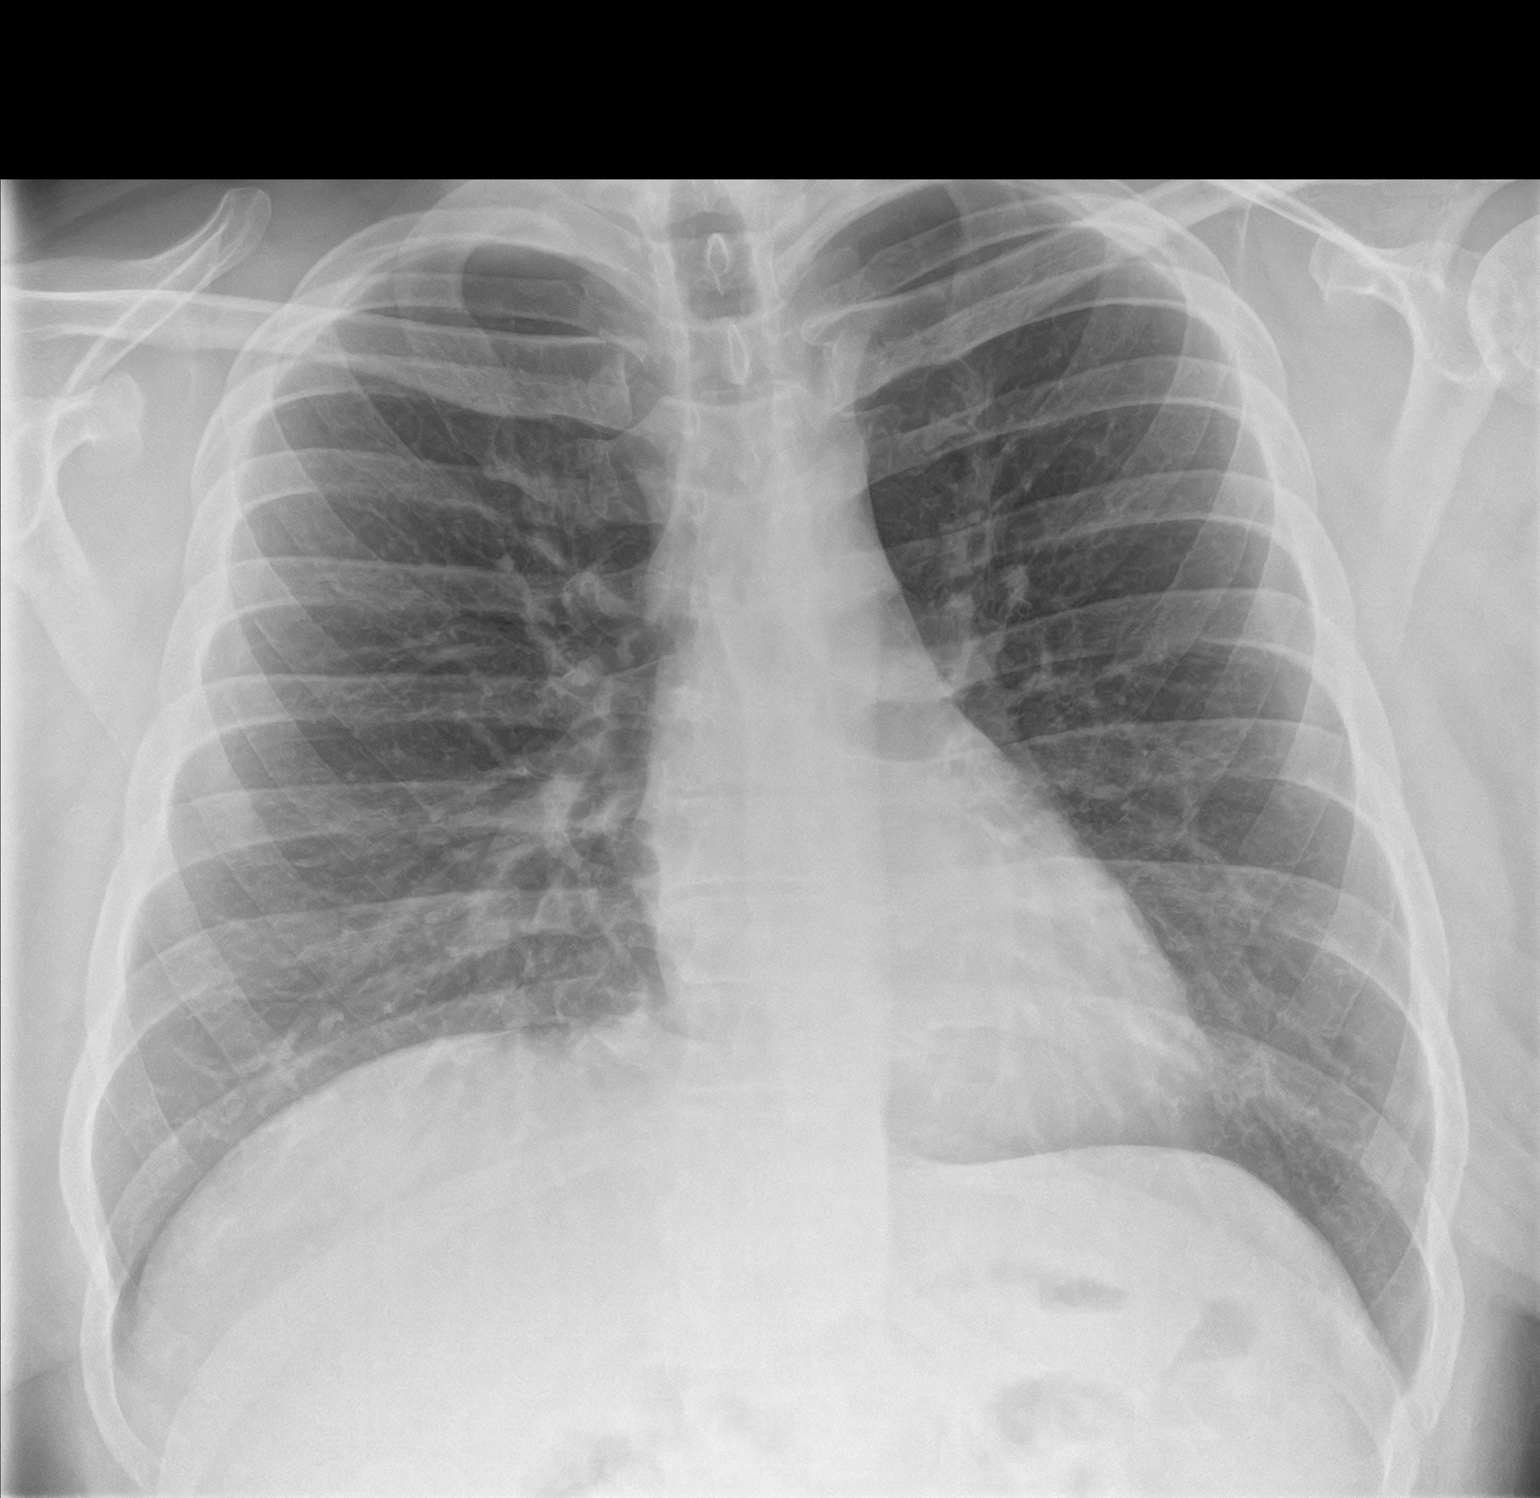
[im 2/2]
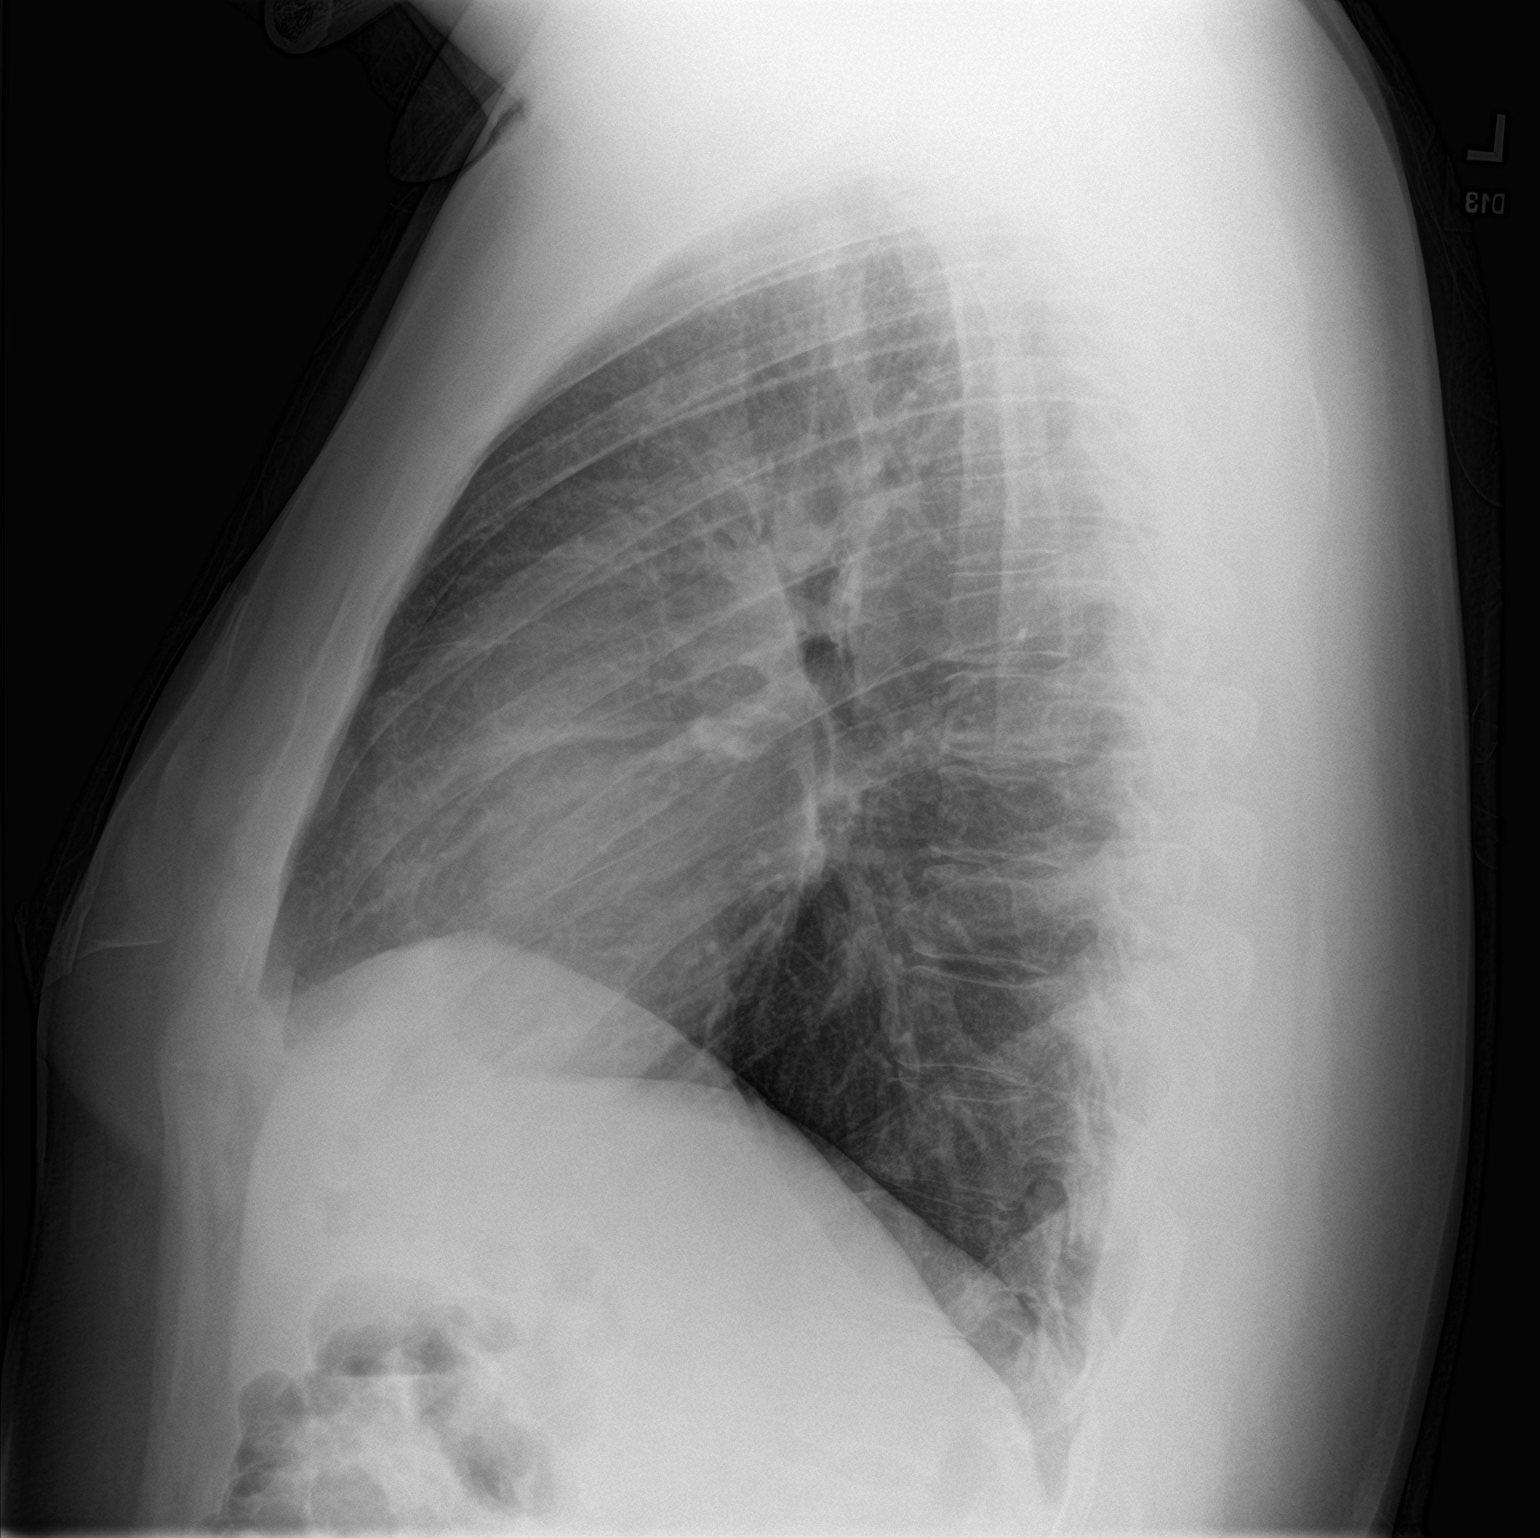

[2 of 2 positions shown; findings below may reference images not displayed]

FINDINGS: Lungs clear. Heart size normal. No pneumothorax or pleural fluid. No
acute or focal bony abnormality.
IMPRESSION: Negative chest.

## 2023-01-26 ENCOUNTER — Encounter: Payer: Self-pay | Admitting: *Deleted

## 2023-01-26 ENCOUNTER — Ambulatory Visit
Admission: EM | Admit: 2023-01-26 | Discharge: 2023-01-26 | Disposition: A | Discharge status: Home / Self Care | Payer: Medicaid Other

## 2023-01-26 DIAGNOSIS — M25512 Pain in left shoulder: Secondary | ICD-10-CM | POA: Diagnosis not present

## 2023-01-26 DIAGNOSIS — M7522 Bicipital tendinitis, left shoulder: Secondary | ICD-10-CM | POA: Diagnosis not present

## 2023-01-26 HISTORY — DX: Polyneuropathy, unspecified: G62.9

## 2023-01-26 MED ORDER — PREDNISONE 10 MG PO TABS: ORAL_TABLET | ORAL | 0 refills | Status: AC

## 2023-01-26 MED ORDER — KETOROLAC TROMETHAMINE 30 MG/ML IJ SOLN
30.0000 mg | Freq: Once | INTRAMUSCULAR | Status: AC
  Administered 2023-01-26: 30 mg via INTRAMUSCULAR

## 2023-01-26 NOTE — ED Provider Notes (Signed)
[facility]    CSN: [id number] Arrival date & time: 01/26/23  1027      History   Chief Complaint Chief Complaint  Patient presents with   Shoulder Injury    HPI Ian [name] is a 42 y.o. male presenting for 3 to 4-day history of left anterior shoulder pain.  He denies any injury.  He says he has been that heavy lifting and building a fence recently.  He also states that he has been using a chainsaw and has been doing a lot of overhead lifting and just using the shoulder a lot.  He reports that he noticed it was popping and making an abnormal sound before the pain started.  He denies any sudden onset of severe pain with any certain movement before the pain began.  He has full range of motion of the shoulder but reports a lot of pain with abduction and flexion.  He has been taking ibuprofen, meloxicam and using a sling that he bought online.  He says he has slight improvement temporarily with this.  He is afraid he may have torn his rotator cuff.  He denies history of torn rotator cuff.  He has taken 400 mg of ibuprofen about 6 to 7 hours ago.  Currently rating pain at 7 out of 10.  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 No pertinent surgical history.     Home Medications    Prior to Admission medications   Medication Sig Start Date End Date  Taking? Authorizing Provider  diazepam (VALIUM) 5 MG tablet Take 5 mg by mouth daily. 01/16/23  Yes [provider]  gabapentin (NEURONTIN) 400 MG capsule Take 400 mg by mouth 4 (four) times daily. 12/19/22  Yes [provider]  meloxicam (MOBIC) 7.5 MG tablet Take 7.5 mg by mouth daily. 12/19/22  Yes [provider]  predniSONE (DELTASONE) 10 MG tablet Take 5 tabs po x 2 days, 4 tabs x 2 days, 3  tabs x 2 days, 2 tabs x 2 days, 1 tab x 2 days 01/26/23  Yes Shirlee Latch, PA-C  acetaminophen (TYLENOL) 325 MG tablet Take 2 tablets (650 mg total) by mouth every 6 (six) hours as needed for mild pain (or Fever >/= 101). 06/21/15   Gouru, Deanna Artis, MD  buPROPion (WELLBUTRIN XL) 150 MG 24 hr tablet Take 150 mg by mouth daily. 12/03/20   [provider]  citalopram (CELEXA) 40 MG tablet Take 1 tablet (40 mg total) by mouth daily. 11/15/15   Pucilowska, Jolanta B, MD  escitalopram (LEXAPRO) 20 MG tablet Take 20 mg by mouth daily.    [provider]  gabapentin (NEURONTIN) 300 MG capsule Take 300 mg by mouth 3 (three) times daily. 12/03/20   [provider]  hydrOXYzine (ATARAX/VISTARIL) 50 MG tablet Take 50 mg by mouth 3 (three) times daily as needed.  09/14/20   [provider]  propranolol (INDERAL) 40 MG tablet Take 20 mg by mouth 2 (two) times daily.    [provider]  traZODone (DESYREL) 100 MG tablet Take 2 tablets (200 mg total) by mouth at bedtime as needed for sleep. 11/15/15   Pucilowska, Ellin Goodie, MD    Family History Family History  Problem Relation Age of Onset   Heart attack Mother    Hypertension Mother    Hypercholesterolemia Mother    Heart attack Father    Hypertension Father    Hypercholesterolemia Father    Depression Maternal Uncle    Suicidality Maternal Uncle    Depression Cousin    Suicidality Cousin     Social History Social History   Tobacco Use   Smoking status: Former    Current packs/day: 0.00     Average packs/day: 0.8 packs/day for 16.0 years (12.0 ttl pk-yrs)    Types: Cigarettes    Start date: 04/05/1999    Quit date: 04/05/2015    Years since quitting: 7.8   Smokeless tobacco: Never  Vaping Use   Vaping status: Every Day  Substance Use Topics   Alcohol use: No    Alcohol/week: 0.0 standard drinks of alcohol   Drug use: No     Allergies   Patient has no known allergies.   Review of Systems Review of Systems  Musculoskeletal:  Positive for arthralgias. Negative for back pain, joint swelling, neck pain and neck stiffness.  Skin:  Negative for color change and wound.  Neurological:  Negative for weakness and numbness.     Physical Exam Triage Vital Signs ED Triage Vitals  Encounter Vitals Group     BP 01/26/23 1049 129/89     Systolic BP Percentile --      Diastolic BP Percentile --      Pulse Rate 01/26/23 1049 78     Resp --      Temp 01/26/23 1049 98.4 F (36.9 C)     Temp Source 01/26/23 1049 Oral     SpO2 01/26/23 1049 97 %     Weight 01/26/23 1043 250 lb (113.4 kg)     Height 01/26/23 1043 6' (1.829 m)     Head Circumference --      Peak Flow --      Pain Score 01/26/23 1042 7     Pain Loc --      Pain Education --      Exclude from Growth Chart --    No data found.  Updated Vital Signs BP 129/89 (BP Location: Right Arm)   Pulse 78   Temp 98.4 F (36.9 C) (Oral)   Ht 6' (1.829 m)   Wt 250 lb (113.4 kg)   SpO2 97%   BMI 33.91 kg/m      Physical Exam Vitals and nursing note reviewed.  Constitutional:      General: He is not in acute distress.    Appearance: Normal appearance. He is well-developed. He is not ill-appearing.  HENT:     Head: Normocephalic and atraumatic.  Eyes:     General: No scleral icterus.    Conjunctiva/sclera: Conjunctivae normal.  Cardiovascular:     Rate and Rhythm: Normal rate and regular rhythm.  Pulmonary:     Effort: Pulmonary effort is normal. No respiratory distress.     Breath sounds: Normal breath  sounds.  Musculoskeletal:     Left shoulder: Tenderness (biceps groove) present. Normal range of motion (Pain  with flexion and abduction >90 degrees). Normal pulse.     Cervical back: Neck supple.     Comments: + Speed's test on left  Skin:    General: Skin is warm and dry.     Capillary Refill: Capillary refill takes less than 2 seconds.  Neurological:     General: No focal deficit present.     Mental Status: He is alert. Mental status is at baseline.     Motor: No weakness.     Gait: Gait normal.  Psychiatric:        Mood and Affect: Mood normal.        Behavior: Behavior normal.      UC Treatments / Results  Labs (all labs ordered are listed, but only abnormal results are displayed) Labs Reviewed - No data to display  EKG   Radiology No results found.  Procedures Procedures (including critical care time)  Medications Ordered in UC Medications  ketorolac (TORADOL) 30 MG/ML injection 30 mg (30 mg Intramuscular Given 01/26/23 1130)    Initial Impression / Assessment and Plan / UC Course  I have reviewed the triage vital signs and the nursing notes.  Pertinent labs & imaging results that were available during my care of the patient were reviewed by me and considered in my medical decision making (see chart for details).   42 year old male presents for left shoulder pain x 3 to 4 days.  Denies injury but has had a lot of repetitive use of the shoulder before onset of symptoms and notes a clicking and popping noise before onset and it has continued.  Increased pain with full flexion and abduction.  Has been taking ibuprofen, meloxicam and using a sling.  Temporary relief provided.  On exam he has tenderness of the biceps groove.  No bony tenderness.  Full range of motion but increased discomfort with greater than 90 degrees flexion and abduction.  Negative supraspinatus testing.  Positive speeds test.  Suspect biceps tendinitis.  Patient given 30 mg IM ketorolac in clinic.   Sent prednisone taper to pharmacy.  Advised may continue ibuprofen and Tylenol.  Advised to use sling for comfort but should try to perform gentle range of motion exercises with the shoulder is not stiff.  Explained if is not feeling better in 1 to 2 weeks or the symptoms worsen he may need to follow-up with orthopedics.   Final Clinical Impressions(s) / UC Diagnoses   Final diagnoses:  Biceps tendinitis of left shoulder  Acute pain of left shoulder     Discharge Instructions      -Likely biceps tendinitis. - I sent a corticosteroid taper to the pharmacy for you.  You may continue taking ibuprofen and Tylenol if needed.  Ice the area, apply heat, use muscle rubs and gentle range of motion exercises. - This should be any better over the next 1 to 2 weeks but if it does not or worsens you should follow-up with [facility] in [place] or [place]. May also contact [facility].    ED Prescriptions     Medication Sig Dispense Auth. Provider   predniSONE (DELTASONE) 10 MG tablet Take 5 tabs po x 2 days, 4 tabs x 2 days, 3  tabs x 2 days, 2 tabs x 2 days, 1 tab x 2 days 30 tablet Shirlee Latch, PA-C      PDMP not reviewed this encounter.   Shirlee Latch, PA-C 01/26/23 1137

## 2023-01-26 NOTE — Discharge Instructions (Addendum)
-  Likely biceps tendinitis. - I sent a corticosteroid taper to the pharmacy for you.  You may continue taking ibuprofen and Tylenol if needed.  Ice the area, apply heat, use muscle rubs and gentle range of motion exercises. - This should be any better over the next 1 to 2 weeks but if it does not or worsens you should follow-up with [facility] in [place] or [place]. May also contact [facility].

## 2023-01-26 NOTE — ED Triage Notes (Signed)
Patient states left shoulder pain x3-4 days after heavy lifting, building fence and using chain saw.  Treating pain with OTC Ibuprofen and Naproxen, Meloxicam.  Wearing a sling purchased from [employer] w/ some relief

## 2023-03-02 ENCOUNTER — Ambulatory Visit
Admission: EM | Admit: 2023-03-02 | Discharge: 2023-03-02 | Disposition: A | Discharge status: Home / Self Care | Payer: Medicaid Other | Attending: Internal Medicine | Admitting: Internal Medicine

## 2023-03-02 DIAGNOSIS — M7582 Other shoulder lesions, left shoulder: Secondary | ICD-10-CM

## 2023-03-02 MED ORDER — NAPROXEN 500 MG PO TABS: 500.0000 mg | ORAL_TABLET | Freq: Two times a day (BID) | ORAL | 0 refills | Status: AC

## 2023-03-02 MED ORDER — KETOROLAC TROMETHAMINE 30 MG/ML IJ SOLN
30.0000 mg | Freq: Once | INTRAMUSCULAR | Status: AC
  Administered 2023-03-02: 30 mg via INTRAMUSCULAR

## 2023-03-02 NOTE — ED Provider Notes (Addendum)
[facility]    CSN: [id number] Arrival date & time: 03/02/23  1436      History   Chief Complaint Chief Complaint  Patient presents with   Shoulder Pain    HPI Ian [name] is a 42 y.o. male presents for shoulder pain.  Patient was seen in urgent care on 9/5 for 3 to 4 days of left shoulder pain.  He was diagnosed with bicep tendinitis and prescribed prednisone and given Toradol in clinic.  He was advised to follow-up with orthopedics.  He states his symptoms had been improving until the past few days when he was more active at work and raising his arms above his head frequently.  He is endorsing left anterior shoulder pain that is worse with movements.  Denies any injury such as fall.  No numbness or tingling.  He has been taking 200 to 400 mg of ibuprofen 3 times a day with minimal relief.  States he tried to see orthopedics today but they did not take his insurance.  He has been using a sling he bought online intermittently.  No other concerns at this time.   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 No pertinent surgical history.     Home Medications    Prior to Admission medications   Medication Sig Start Date End Date Taking? Authorizing Provider  naproxen (NAPROSYN) 500 MG tablet Take 1 tablet (500 mg total) by mouth 2 (two)  times daily for 7 days. 03/03/23 03/10/23 Yes Radford Pax, NP  acetaminophen (TYLENOL) 325 MG tablet Take 2 tablets (650 mg total) by mouth every 6 (six) hours as needed for mild pain (or Fever >/= 101). 06/21/15   Gouru, Deanna Artis, MD  buPROPion (WELLBUTRIN XL) 150 MG 24 hr tablet Take 150 mg by mouth daily. 12/03/20   [provider]  citalopram (CELEXA) 40 MG tablet Take 1 tablet (40 mg total) by mouth daily. 11/15/15   Pucilowska, Jolanta B, MD  diazepam (VALIUM) 5 MG tablet Take 5 mg by mouth daily. 01/16/23   [provider]  escitalopram (LEXAPRO) 20 MG tablet Take 20 mg by mouth daily.    [provider]  gabapentin (NEURONTIN) 300 MG capsule Take 300 mg by mouth 3 (three) times daily. 12/03/20   [provider]  gabapentin (NEURONTIN) 400 MG capsule Take 400 mg by mouth 4 (four) times daily. 12/19/22   [provider]  hydrOXYzine (ATARAX/VISTARIL) 50 MG tablet Take 50 mg by mouth 3 (three) times daily as needed. 09/14/20   [provider]  predniSONE (DELTASONE) 10 MG tablet Take 5 tabs po x 2 days, 4 tabs x 2 days, 3  tabs x 2 days, 2 tabs x 2 days, 1 tab x 2 days 01/26/23   Shirlee Latch, PA-C  propranolol (INDERAL) 40 MG tablet Take 20 mg by mouth  2 (two) times daily.    [provider]  traZODone (DESYREL) 100 MG tablet Take 2 tablets (200 mg total) by mouth at bedtime as needed for sleep. 11/15/15   Pucilowska, Ellin Goodie, MD    Family History Family History  Problem Relation Age of Onset   Heart attack Mother    Hypertension Mother    Hypercholesterolemia Mother    Heart attack Father    Hypertension Father    Hypercholesterolemia Father    Depression Maternal Uncle    Suicidality Maternal Uncle    Depression Cousin    Suicidality Cousin     Social History Social History   Tobacco Use   Smoking status: Former    Current packs/day: 0.00    Average packs/day: 0.8 packs/day for 16.0 years (12.0 ttl pk-yrs)    Types:  Cigarettes    Start date: 04/05/1999    Quit date: 04/05/2015    Years since quitting: 7.9   Smokeless tobacco: Never  Vaping Use   Vaping status: Every Day  Substance Use Topics   Alcohol use: No    Alcohol/week: 0.0 standard drinks of alcohol   Drug use: No     Allergies   Patient has no known allergies.   Review of Systems Review of Systems  Musculoskeletal:        Left shoulder pain      Physical Exam Triage Vital Signs ED Triage Vitals  Encounter Vitals Group     BP 03/02/23 1503 (!) 138/96     Systolic BP Percentile --      Diastolic BP Percentile --      Pulse Rate 03/02/23 1503 77     Resp 03/02/23 1503 17     Temp 03/02/23 1503 98.2 F (36.8 C)     Temp Source 03/02/23 1503 Oral     SpO2 03/02/23 1503 97 %     Weight --      Height --      Head Circumference --      Peak Flow --      Pain Score 03/02/23 1502 7     Pain Loc --      Pain Education --      Exclude from Growth Chart --    No data found.  Updated Vital Signs BP (!) 138/96 (BP Location: Right Arm)   Pulse 77   Temp 98.2 F (36.8 C) (Oral)   Resp 17   SpO2 97%   Visual Acuity Right Eye Distance:   Left Eye Distance:   Bilateral Distance:    Right Eye Near:   Left Eye Near:    Bilateral Near:     Physical Exam Vitals and nursing note reviewed.  Constitutional:      General: He is not in acute distress.    Appearance: Normal appearance. He is not ill-appearing.  HENT:     Head: Normocephalic and atraumatic.  Eyes:     Pupils: Pupils are equal, round, and reactive to light.  Cardiovascular:     Rate and Rhythm: Normal rate.  Pulmonary:     Effort: Pulmonary effort is normal.  Musculoskeletal:     Left shoulder: Tenderness present. No swelling, deformity, effusion, laceration, bony tenderness or crepitus. Decreased range of motion. Normal strength. Normal pulse.       Arms:     Comments: Tender to palpation over left rotator cuff region.  Positive Leanord Asal test.   Pain with active frontal and lateral raise to  90 degrees.  Strength 5 out of 5 bilateral upper extremities.  Skin:    General: Skin is warm and dry.  Neurological:     General: No focal deficit present.     Mental Status: He is alert and oriented to person, place, and time.  Psychiatric:        Mood and Affect: Mood normal.        Behavior: Behavior normal.      UC Treatments / Results  Labs (all labs ordered are listed, but only abnormal results are displayed) Labs Reviewed - No data to display  EKG   Radiology No results found.  Procedures Procedures (including critical care time)  Medications Ordered in UC Medications  ketorolac (TORADOL) 30 MG/ML injection 30 mg (30 mg Intramuscular Given 03/02/23 1549)    Initial Impression / Assessment and Plan / UC Course  I have reviewed the triage vital signs and the nursing notes.  Pertinent labs & imaging results that were available during my care of the patient were reviewed by me and considered in my medical decision making (see chart for details).     Reviewed exam and symptoms with patient.  No red flags.  Discussed likely rotator cuff injury.  His Toradol was beneficial to last time we will do Toradol injection.  He was instructed no NSAIDs for 24 hours and verbalized understanding.  He will start naproxen twice daily as needed x 7 days tomorrow, 10/11.  He may take Tylenol today as needed.  Instructed to follow-up with [facility] for further workup and treatment of his symptoms.  ER precautions were reviewed and patient verbalized understanding. Final Clinical Impressions(s) / UC Diagnoses   Final diagnoses:  Rotator cuff tendinitis, left     Discharge Instructions      You were given a Toradol injection in clinic today. Do not take any over the counter NSAID's such as Advil, ibuprofen, Aleve, or naproxen for 24 hours. You may take tylenol if needed.  Start prescription naproxen twice daily for 7 days.  Please take  this with food.  Do not take any additional over-the-counter NSAIDs such as Advil, ibuprofen, Aleve as these are similar medications.  You may continue Tylenol if needed.  Please follow-up with [facility] for further treatment options.  Please go to the ER if you develop any worsening symptoms.  I hope you feel better soon!      ED Prescriptions     Medication Sig Dispense Auth. Provider   naproxen (NAPROSYN) 500 MG tablet Take 1 tablet (500 mg total) by mouth 2 (two) times daily for 7 days. 14 tablet Radford Pax, NP      PDMP not reviewed this encounter.   Radford Pax, NP 03/02/23 1538    Radford Pax, NP 03/02/23 [phone]

## 2023-03-02 NOTE — Discharge Instructions (Addendum)
You were given a Toradol injection in clinic today. Do not take any over the counter NSAID's such as Advil, ibuprofen, Aleve, or naproxen for 24 hours. You may take tylenol if needed.  Start prescription naproxen twice daily for 7 days.  Please take this with food.  Do not take any additional over-the-counter NSAIDs such as Advil, ibuprofen, Aleve as these are similar medications.  You may continue Tylenol if needed.  Please follow-up with [facility] for further treatment options.  Please go to the ER if you develop any worsening symptoms.  I hope you feel better soon!

## 2023-03-02 NOTE — ED Triage Notes (Signed)
Patient stated that he is still having issues with his left shoulder. He had an appointment with his specialist today but they do not take his insurance.

## 2023-03-20 ENCOUNTER — Ambulatory Visit: Payer: Medicaid Other | Attending: Orthopedic Surgery

## 2023-03-20 DIAGNOSIS — M7542 Impingement syndrome of left shoulder: Secondary | ICD-10-CM | POA: Diagnosis present

## 2023-03-20 DIAGNOSIS — M25512 Pain in left shoulder: Secondary | ICD-10-CM | POA: Diagnosis present

## 2023-03-20 DIAGNOSIS — M7522 Bicipital tendinitis, left shoulder: Secondary | ICD-10-CM | POA: Insufficient documentation

## 2023-03-20 NOTE — Therapy (Signed)
OUTPATIENT PHYSICAL THERAPY SHOULDER EVALUATION   Patient Name: Ian [name] MRN: [id number] DOB:1981-02-26, 42 y.o., male Today's Date: 03/20/2023  END OF SESSION:  PT End of Session - 03/20/23 1511     Visit Number 1    Number of Visits 7    Date for PT Re-Evaluation 05/01/23    Authorization Type 1-2x/week x 6 weeks    Authorization Time Period requested 6 visits    PT Start Time 1030    PT Stop Time 1120    PT Time Calculation (min) 50 min    Activity Tolerance Patient tolerated treatment well    Behavior During Therapy [facility] for tasks assessed/performed             Past Medical History:  Diagnosis Date   Anxiety    Depression    Hypercholesteremia    Hypertension    Neuropathy    Panic disorder 03/24/2015   History reviewed. No pertinent surgical history. 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   Depression, major, recurrent, in complete remission (HCC) 02/09/2015   Depression, major, recurrent, moderate (HCC) 02/09/2015    PCP: [facility]   REFERRING PROVIDER: Lanney Gins, PA-C  REFERRING DIAG: [phone] Pain in L shoulder  THERAPY DIAG:  Left shoulder pain, unspecified chronicity  Biceps tendonitis on left  Rationale for Evaluation and Treatment: Rehabilitation  ONSET DATE: Early summer 2024  SUBJECTIVE:                                                                                                                                                                                      SUBJECTIVE STATEMENT: Pt reports his L shoulder started bothering him this summer.  He started noticing a little catch in  his shoulder.  He worked on building a fence and he was trying to use a chainsaw and his shoulder was sore after that.  There was no traumatic mechanics, no "pop".He went to ER and got some antiinflammatory treatment prednisone- it was helpful; he then noticed sx returned after the prednisone, saw his PCP and was referred to PT.  The place he was referred did not take his insurance so he went back to ER in October bc of the pain.  They did a toradol shot and it was helpful.  He then went for a consult at [facility] in October.  Had x-rays (-) for fx.  He had a steroid shot at orthopedics- got some relief from that.  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 Yes   PRECAUTIONS: None  RED FLAGS: None   WEIGHT BEARING RESTRICTIONS: No  FALLS:  Has patient fallen in last 6 months? No  LIVING ENVIRONMENT: Lives with: lives with their family Lives in: House/apartment Stairs: yes, no issues Has following equipment at home: None  OCCUPATION: Not working  Currently helping with caregiving for his parents- has to physically lift and assist his Dad with transfers and mobility- he is the sore caregiver  He enjoys doing physical work at home   PLOF: Independent  PATIENT GOALS:to be able to perform his daily activities without being limited by L shoulder pain  NEXT MD VISIT:   OBJECTIVE:  Note: Objective measures were completed at Evaluation unless otherwise  noted.  DIAGNOSTIC FINDINGS:  X-rays of L shoulder- he states he was told he has some arthritis, maybe RTC tendonitis    PATIENT SURVEYS:  FOTO 43/63  COGNITION: Overall cognitive status: Within functional limits for tasks assessed     SENSATION: deferred  POSTURE: Forward head, rounded forward shoulders  UPPER EXTREMITY ROM:   Active ROM Right eval Left eval  Shoulder flexion 170 105 * PROM 145  Shoulder extension    Shoulder abduction    Shoulder adduction    Shoulder internal rotation HBB L  thumb to L2 HBB thumb to inf scap  Shoulder external rotation 85 45* PROM 65  Elbow flexion normal normal  Elbow extension normal normal  Wrist flexion    Wrist extension    Wrist ulnar deviation    Wrist radial deviation    Wrist pronation    Wrist supination    (Blank rows = not tested) *= pain UPPER EXTREMITY MMT:  MMT Right eval Left eval  Shoulder flexion 5/5 4+/5 *  Shoulder extension    Shoulder abduction  4+/5 *  Shoulder adduction    Shoulder internal rotation  4+/5 *  Shoulder external rotation  4+/5   Middle trapezius    Lower trapezius    Elbow flexion  4+/5 *  Elbow extension  4+/5  Wrist flexion    Wrist extension    Wrist ulnar deviation    Wrist radial deviation    Wrist pronation    Wrist supination    Grip strength (lbs)    (Blank rows = not tested)*= pain  SHOULDER SPECIAL TESTS: + painful arc L; + lateral jobe for pain and mild weakness L; (-) TTP proximal LH biceps tendon  JOINT MOBILITY TESTING:  Hypomobility L GH jt inf and AP glides  PALPATION:  Impaired scapulothoracic mm motor control- further assessment needed at next visit (+) MTP L infraspinatus, UT, pec maj  Cervical Spine screen: did not reproduce L shoulder pain with cervical spine AROM and OP and (-) Spurling's R and L; deferred neural tension and full  neuro screen    TODAY'S TREATMENT:                                                                                                                                          DATE: 03/20/27 Initial evaluation performed  HEP instruction: supine AAROM with stick- flexion and ER x10 ea; discussed purpose of PT and plan to address GH jt hypomobility, MTP, shoulder mm motor control/strength   PATIENT EDUCATION: Education details: PT POC/goals Person educated: Patient Education method: Explanation Education comprehension: verbalized understanding  HOME EXERCISE PROGRAM: See above  ASSESSMENT:  CLINICAL IMPRESSION: Patient is a 42 y.o. M who was seen today for physical therapy evaluation and treatment for  L shoulder pain.  Presentation is consistent with RTC/biceps tendinopathy. Impairments include L shoulder hypomobility, MTP, pain, GH hypomobility, decreased shoulder mm motor control and strength, and impaired posture.  OBJECTIVE IMPAIRMENTS: decreased activity tolerance, decreased ROM, decreased strength, hypomobility, improper body mechanics, postural dysfunction, and pain.   ACTIVITY LIMITATIONS: carrying, lifting, reach over head, and caring for others  PARTICIPATION LIMITATIONS: cleaning, interpersonal relationship, community activity, yard work, and caregiver duties for family  PERSONAL FACTORS: Past/current experiences and Time since onset of injury/illness/exacerbation are also affecting patient's functional outcome.   REHAB POTENTIAL: Good  CLINICAL DECISION MAKING: Stable/uncomplicated  EVALUATION COMPLEXITY: Low   GOALS: Goals reviewed with patient? Yes  SHORT TERM GOALS: Target date: 04/23/23  Pt will be able to perform HEP for shoulder ROM/strengthening 4x/week to facilitate optimal long term outcome with PT Baseline: initiated today Goal status: INITIAL    LONG TERM GOALS: Target date: 05/01/23   Improve shoulder strength (abd, IR) 1/2 MMT grade to promote improved ability to lift and perform his daily activities as a caregiver without being limited by L shoulder pain Baseline:  4+/5 Goal status: INITIAL  2.   Improve FOTO to 63 indicating pt able to perform daily activities without being limited by L shoulder pain Baseline: 43 Goal status: INITIAL  3.  Improve L shoulder AROM flexion to >150 deg to be able to perform overhead activities without being limited by L shoulder pain Baseline: AROM 105 Goal status: INITIAL    PLAN:  PT FREQUENCY: 1-2x/week  PT DURATION: 8 weeks  PLANNED INTERVENTIONS: 97110-Therapeutic exercises, 97530- Therapeutic activity, 97112- Neuromuscular re-education, 97535- Self Care, and 08657- Manual therapy  PLAN FOR NEXT SESSION: manual therapy for GH jt hypomobility, progress L shoulder AAROM/PROM as able to tol, assess scapular mm strength, L shoulder mm strengthening; also discuss body mechanics for caregiving duties to help him use his shoulder optimally (pt is caring for his Father right now and helping with transfers/mobility).  Max Fickle, PT, DPT, OCS  Ardine Bjork, PT 03/20/2023, 3:14 PM

## 2023-03-22 ENCOUNTER — Ambulatory Visit: Payer: Medicaid Other

## 2023-03-22 DIAGNOSIS — M7522 Bicipital tendinitis, left shoulder: Secondary | ICD-10-CM

## 2023-03-22 DIAGNOSIS — M25512 Pain in left shoulder: Secondary | ICD-10-CM | POA: Diagnosis not present

## 2023-03-22 DIAGNOSIS — M7542 Impingement syndrome of left shoulder: Secondary | ICD-10-CM

## 2023-03-22 NOTE — Therapy (Addendum)
OUTPATIENT PHYSICAL THERAPY SHOULDER EVALUATION   Patient Name: Ian [name] MRN: [id number] DOB:May 20, 1981, 42 y.o., male Today's Date: 03/22/2023  END OF SESSION:  PT End of Session - 03/22/23 1302     Visit Number 2    Date for PT Re-Evaluation 05/01/23    Authorization Type 1-2x/week x 6 weeks    Authorization Time Period requested 6 visits    PT Start Time 1030    PT Stop Time 1115    PT Time Calculation (min) 45 min    Activity Tolerance Patient tolerated treatment well    Behavior During Therapy [facility] for tasks assessed/performed              Past Medical History:  Diagnosis Date   Anxiety    Depression    Hypercholesteremia    Hypertension    Neuropathy    Panic disorder 03/24/2015   History reviewed. No pertinent surgical history. 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   Depression, major, recurrent, in complete remission (HCC) 02/09/2015   Depression, major, recurrent, moderate (HCC) 02/09/2015    PCP: [facility]   REFERRING PROVIDER: Lanney Gins, PA-C  REFERRING DIAG: [phone] Pain in L shoulder  THERAPY DIAG:  Left shoulder pain, unspecified chronicity  Biceps tendonitis on left  Impingement syndrome of left shoulder  Rationale for Evaluation and Treatment: Rehabilitation  ONSET DATE: Early summer 2024  SUBJECTIVE:                                                                                                                                                                                      SUBJECTIVE STATEMENT: Pt reports his L shoulder started bothering him this summer.  He started noticing  a little catch in his shoulder.  He worked on building a fence and he was trying to use a chainsaw and his shoulder was sore after that.  There was no traumatic mechanics, no "pop".He went to ER and got some antiinflammatory treatment prednisone- it was helpful; he then noticed sx returned after the prednisone, saw his PCP and was referred to PT.  The place he was referred did not take his insurance so he went back to ER in October bc of the pain.  They did a toradol shot and it was helpful.  He then went for a consult at [facility] in October.  Had x-rays (-) for fx.  He had a steroid shot at orthopedics- got some relief from that.  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 Yes   PRECAUTIONS: None  RED FLAGS: None   WEIGHT BEARING RESTRICTIONS: No  FALLS:  Has patient fallen in last 6 months? No  LIVING ENVIRONMENT: Lives with: lives with their family Lives in: House/apartment Stairs: yes, no issues Has following equipment at home: None  OCCUPATION: Not working  Currently helping with caregiving for his parents- has to physically lift and assist his Dad with transfers and mobility- he is the sore caregiver  He enjoys doing physical work at home   PLOF: Independent  PATIENT GOALS:to be able to perform his daily activities without being limited by L shoulder pain  NEXT MD VISIT:   OBJECTIVE:  Note: Objective measures were completed at Evaluation  unless otherwise noted.  DIAGNOSTIC FINDINGS:  X-rays of L shoulder- he states he was told he has some arthritis, maybe RTC tendonitis    PATIENT SURVEYS:  FOTO 43/63  COGNITION: Overall cognitive status: Within functional limits for tasks assessed     SENSATION: deferred  POSTURE: Forward head, rounded forward shoulders  UPPER EXTREMITY ROM:   Active ROM Right eval Left eval  Shoulder flexion 170 105 * PROM 145  Shoulder extension    Shoulder abduction    Shoulder adduction    Shoulder internal rotation HBB L  thumb to L2 HBB thumb to inf scap  Shoulder external rotation 85 45* PROM 65  Elbow flexion normal normal  Elbow extension normal normal  Wrist flexion    Wrist extension    Wrist ulnar deviation    Wrist radial deviation    Wrist pronation    Wrist supination    (Blank rows = not tested) *= pain UPPER EXTREMITY MMT:  MMT Right eval Left eval  Shoulder flexion 5/5 4+/5 *  Shoulder extension    Shoulder abduction  4+/5 *  Shoulder adduction    Shoulder internal rotation  4+/5 *  Shoulder external rotation  4+/5   Middle trapezius    Lower trapezius    Elbow flexion  4+/5 *  Elbow extension  4+/5  Wrist flexion    Wrist extension    Wrist ulnar deviation    Wrist radial deviation    Wrist pronation    Wrist supination    Grip strength (lbs)    (Blank rows = not tested)*= pain  SHOULDER SPECIAL TESTS: + painful arc L; + lateral jobe for pain and mild weakness L; (-) TTP proximal LH biceps tendon  JOINT MOBILITY TESTING:  Hypomobility L GH jt inf and AP glides  PALPATION:  Impaired scapulothoracic mm motor control- further assessment needed at next visit (+) MTP L infraspinatus, UT, pec maj  Cervical Spine screen: did not reproduce L shoulder pain with cervical spine AROM and OP and (-) Spurling's R and L; deferred neural tension and full  neuro screen    TODAY'S TREATMENT:    Pt reported of 6 to 7/10 pain with movement intermittently.  Otherwise 0/10.                                                                                                                                      DATE: 03/22/23  Manual: 13 mins L shldr jt mobs in all direction  PROM in all planes Kinesio Tap intrascapular region for posture correction.  TE: 32 secs UBE: 5 mins 2.5/2.34min F/B Shldr flex/ABD with Wand and #3 wts 60 secs each Serratus ant punches #3 x 60 secs Scap retraction with GTB x 60 secs Shldr shrugs with GTB x 60 secs Bicep curls GTB x 60 secs Tricep PREs with GTB x 60 secs W to Y on wall x 60 secs Lower nd Mid traps activation x 60 secs each.      Not today HEP instruction: supine AAROM with stick- flexion and ER x10 ea; discussed purpose of PT and plan to address GH jt hypomobility, MTP, shoulder mm motor control/strength   PATIENT EDUCATION: Education details: PT POC/goals Person educated: Patient Education method: Explanation Education comprehension: verbalized understanding  HOME EXERCISE PROGRAM: Access Code: [email or web address] URL: [email or web address] Date: 03/22/2023 Prepared by: Janet Berlin  Exercises - Supine Shoulder Flexion Extension AAROM with Dowel  - 1 x daily - 7 x weekly - 3 sets - 10 reps - Scapular Retraction with Resistance  - 1 x daily - 7 x weekly - 3 sets - 10 reps - Standing Shoulder Shrug with Resistance  - 1 x daily - 7 x weekly - 3 sets - 10 reps - Shoulder extension with resistance - Neutral  - 1 x daily - 7 x weekly - 3 sets - 10 reps - Standing Single Arm Elbow Flexion with Resistance  - 1 x daily - 7 x weekly - 3 sets - 10 reps - Standing Shoulder Horizontal Abduction with Resistance  - 1 x daily - 7 x weekly - 3 sets - 10 reps - Low Trap Setting at Wall  - 1 x daily - 7 x weekly - 3 sets - 10 reps  ASSESSMENT:  CLINICAL IMPRESSION: Patient presents with pain with movement which subsided with Postural corrections. Pt demonstrates rounded shoulder, forward head and winging of  the L scapula. PT present time educating pt to improve postural alignment and proper technique with each exs. Pt has weak Postural muscles contributing to shldr pain. Pt introduced to  shldr and scopula strengthening exs in proper posture. PT applied a K tape to Mid scapular area to promote good carryover of the posture. Pt has a K tape at home which he plans to apply for postural improvement. Pt tol tx well. Pt will continue with Continued PT address impairments.   OBJECTIVE IMPAIRMENTS: decreased activity tolerance, decreased ROM, decreased strength, hypomobility, improper body mechanics, postural dysfunction, and pain.  ACTIVITY LIMITATIONS: carrying, lifting, reach over head, and caring for others  PARTICIPATION LIMITATIONS: cleaning, interpersonal relationship, community activity, yard work, and caregiver duties for family  PERSONAL FACTORS: Past/current experiences and Time since onset of injury/illness/exacerbation are also affecting patient's functional outcome.   REHAB POTENTIAL: Good  CLINICAL DECISION MAKING: Stable/uncomplicated  EVALUATION COMPLEXITY: Low   GOALS: Goals reviewed with patient? Yes  SHORT TERM GOALS: Target date: 04/23/23  Pt will be able to perform HEP for shoulder ROM/strengthening 4x/week to facilitate optimal long term outcome with PT Baseline: initiated today Goal status: INITIAL    LONG TERM GOALS: Target date: 05/01/23   Improve shoulder strength (abd, IR) 1/2 MMT grade to promote improved ability to lift and perform his daily activities as a caregiver without being limited by L shoulder pain Baseline: 4+/5 Goal status: INITIAL  2.   Improve FOTO to 63 indicating pt able to perform daily activities without being limited by L shoulder pain Baseline: 43 Goal status: INITIAL  3.  Improve L shoulder AROM flexion to >150 deg to be able to perform overhead activities without being limited by L shoulder pain Baseline: AROM 105 Goal status:  INITIAL    PLAN:  PT FREQUENCY: 1-2x/week  PT DURATION: 8 weeks  PLANNED INTERVENTIONS: 97110-Therapeutic exercises, 97530- Therapeutic activity, 97112- Neuromuscular re-education, 97535- Self Care, and 81191- Manual therapy  PLAN FOR NEXT SESSION: manual therapy for GH jt hypomobility, progress L shoulder AAROM/PROM as able to tol, assess scapular mm strength, L shoulder mm strengthening; also discuss body mechanics for caregiving duties to help him use his shoulder optimally (pt is caring for his Father right now and helping with transfers/mobility).  Janet Berlin PT DPT 1:06 PM,03/22/23

## 2023-03-27 ENCOUNTER — Ambulatory Visit: Payer: Medicaid Other | Attending: Orthopedic Surgery

## 2023-03-27 DIAGNOSIS — M25512 Pain in left shoulder: Secondary | ICD-10-CM | POA: Diagnosis present

## 2023-03-27 DIAGNOSIS — M7522 Bicipital tendinitis, left shoulder: Secondary | ICD-10-CM | POA: Insufficient documentation

## 2023-03-27 DIAGNOSIS — M7542 Impingement syndrome of left shoulder: Secondary | ICD-10-CM | POA: Diagnosis present

## 2023-03-27 NOTE — Therapy (Signed)
OUTPATIENT PHYSICAL THERAPY SHOULDER TREATMENT   Patient Name: Ian [name] MRN: [id number] DOB:July 21, 1980, 42 y.o., male Today's Date: 03/27/2023  END OF SESSION:  PT End of Session - 03/27/23 1031     Visit Number 3    Number of Visits 7    Date for PT Re-Evaluation 05/01/23    Authorization Type 1-2x/week x 6 weeks    Authorization Time Period requested 6 visits    PT Start Time 1030    PT Stop Time 1115    PT Time Calculation (min) 45 min    Activity Tolerance Patient tolerated treatment well    Behavior During Therapy [facility] for tasks assessed/performed              Past Medical History:  Diagnosis Date   Anxiety    Depression    Hypercholesteremia    Hypertension    Neuropathy    Panic disorder 03/24/2015   No past surgical history on file. 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   Depression, major, recurrent, in complete remission (HCC) 02/09/2015   Depression, major, recurrent, moderate (HCC) 02/09/2015    PCP: [facility] Real [facility]   REFERRING PROVIDER: Lanney Gins, PA-C  REFERRING DIAG: [phone] Pain in L shoulder  THERAPY DIAG:  Left shoulder pain, unspecified chronicity  Biceps tendonitis on left  Impingement syndrome of left shoulder  Rationale for Evaluation and Treatment: Rehabilitation  ONSET DATE: Early summer 2024  SUBJECTIVE:                                                                                                                                                                                      SUBJECTIVE STATEMENT: Pt reports his L shoulder started bothering him this summer.  He started  noticing a little catch in his shoulder.  He worked on building a fence and he was trying to use a chainsaw and his shoulder was sore after that.  There was no traumatic mechanics, no "pop".He went to ER and got some antiinflammatory treatment prednisone- it was helpful; he then noticed sx returned after the prednisone, saw his PCP and was referred to PT.  The place he was referred did not take his insurance so he went back to ER in October bc of the pain.  They did a toradol shot and it was helpful.  He then went for a  consult at [facility] in October.  Had x-rays (-) for fx.  He had a steroid shot at orthopedics- got some relief from that.  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 Yes   PRECAUTIONS: None  RED FLAGS: None   WEIGHT BEARING RESTRICTIONS: No  FALLS:  Has patient fallen in last 6 months? No  LIVING ENVIRONMENT: Lives with: lives with their family Lives in: House/apartment Stairs: yes, no issues Has following equipment at home: None  OCCUPATION: Not working  Currently helping with caregiving for his parents- has to physically lift and assist his Dad with transfers and mobility- he is the sore caregiver  He enjoys doing physical work at home   PLOF: Independent  PATIENT GOALS:to be able to perform his daily activities without being limited by L shoulder pain  NEXT MD VISIT:   OBJECTIVE:  Note: Objective measures were completed at  Evaluation unless otherwise noted.  DIAGNOSTIC FINDINGS:  X-rays of L shoulder- he states he was told he has some arthritis, maybe RTC tendonitis    PATIENT SURVEYS:  FOTO 43/63  COGNITION: Overall cognitive status: Within functional limits for tasks assessed     SENSATION: deferred  POSTURE: Forward head, rounded forward shoulders  UPPER EXTREMITY ROM:   Active ROM Right eval Left eval  Shoulder flexion 170 105 * PROM 145  Shoulder extension    Shoulder abduction    Shoulder adduction    Shoulder internal rotation HBB L  thumb to L2 HBB thumb to inf scap  Shoulder external rotation 85 45* PROM 65  Elbow flexion normal normal  Elbow extension normal normal  Wrist flexion    Wrist extension    Wrist ulnar deviation    Wrist radial deviation    Wrist pronation    Wrist supination    (Blank rows = not tested) *= pain UPPER EXTREMITY MMT:  MMT Right eval Left eval  Shoulder flexion 5/5 4+/5 *  Shoulder extension    Shoulder abduction  4+/5 *  Shoulder adduction    Shoulder internal rotation  4+/5 *  Shoulder external rotation  4+/5   Middle trapezius    Lower trapezius    Elbow flexion  4+/5 *  Elbow extension  4+/5  Wrist flexion    Wrist extension    Wrist ulnar deviation    Wrist radial deviation    Wrist pronation    Wrist supination    Grip strength (lbs)    (Blank rows = not tested)*= pain  SHOULDER SPECIAL TESTS: + painful arc L; + lateral jobe for pain and mild weakness L; (-) TTP proximal LH biceps tendon  JOINT MOBILITY TESTING:  Hypomobility L GH jt inf and AP glides  PALPATION:  Impaired scapulothoracic mm motor control- further assessment needed at next visit (+) MTP L infraspinatus, UT, pec maj  Cervical Spine screen: did not reproduce L shoulder pain with cervical spine AROM and OP and (-) Spurling's R  and L; deferred neural tension and full neuro screen    TODAY'S TREATMENT:    Subjective: Pt reports the KT tape technique was a  good reminder for his posture; he states overall he is feeling a little better since starting the HEP last week.                                                                                                                                    DATE: 03/27/23  Pain: 0/10 at rest upon arrival  Treatment:  Manual Therapy:  15 mins L shldr jt mobs- GH A/P and inferior glides Gr III/IV 30-40 sec bouts PROM in all planes Skin inspection; held on applying KT tape as pt's skin is a little red along lateral borders where tape was left on he said for >3 days.  Instructed pt to hold off on applying to same area today and and to try applying inferior and only leaving on 1 day next time     Therapeutic Exercises: 30 minutes UBE: 5 mins 2.5/2.1min F/B-not today Shoulder 10x wand AAROM Shoulder ER 10x wand xAAROM  Serratus ant punches #3 2x12 Seated scapular retraction x15 with PT tactile cues Scap retraction with GTB x 60 secs Standing rows: GTB 2x12 Standing shoulder extension: GTB 2x12 Seated thoracic extension over chair 2x10 Bicep curls GTB x 60 secs- not today Tricep PREs with GTB x 60 secs- not today W to Y on wall x 60 secs- not today HEP Lower and Mid traps activation x 60 secs each. - not today HEP  Pt education for shoulder mechanics during shoulder height and overhead reach activities; cue to firs achieve upright posture and then "keep thumb up" with reaching  PATIENT EDUCATION: Education details: PT POC/goals/HEP Person educated: Patient Education method: Explanation Education comprehension: verbalized understanding  HOME EXERCISE PROGRAM: Access Code: [email or web address] URL: [email or web address] Date: 03/27/2023 Prepared by: Max Fickle  Exercises - Supine Shoulder Flexion Extension AAROM with Dowel  - 1 x daily - 7 x weekly - 3 sets - 10 reps - Scapular Retraction with Resistance  - 1 x daily - 7 x weekly - 3 sets - 10 reps - Standing Shoulder Shrug with  Resistance  - 1 x daily - 7 x weekly - 3 sets - 10 reps - Shoulder extension with resistance - Neutral  - 1 x daily - 7 x weekly - 3 sets - 10 reps - Standing Single Arm Elbow Flexion with Resistance  - 1 x daily - 7 x weekly - 3 sets - 10 reps - Standing Shoulder Horizontal Abduction with Resistance  - 1 x daily - 7 x weekly - 3 sets - 10 reps - Low Trap Setting at Wall  - 1 x daily - 7 x weekly - 3 sets - 10 reps - Seated Thoracic Lumbar Extension  - 3 x daily - 7 x weekly - 10 reps  ASSESSMENT:  CLINICAL  IMPRESSION: Patient demonstrates impaired ability to activate middle/lower trapezius mm; responded well to tactile cues from PT.  Overall, his ability to flex shoulder through available AROM at end of session was improved with specifically improved trunk posture, improved humerus neutral position, and with decreased pain noted during motion.  Overall, he should continue to benefit from skilled PT to address impairments listed below and to facilitate return to PLOF including being able to carry, reach, lift, and perform caregiver duties without being limited by L shoulder pain.  OBJECTIVE IMPAIRMENTS: decreased activity tolerance, decreased ROM, decreased strength, hypomobility, improper body mechanics, postural dysfunction, and pain.   ACTIVITY LIMITATIONS: carrying, lifting, reach over head, and caring for others  PARTICIPATION LIMITATIONS: cleaning, interpersonal relationship, community activity, yard work, and caregiver duties for family  PERSONAL FACTORS: Past/current experiences and Time since onset of injury/illness/exacerbation are also affecting patient's functional outcome.   REHAB POTENTIAL: Good  CLINICAL DECISION MAKING: Stable/uncomplicated  EVALUATION COMPLEXITY: Low   GOALS: Goals reviewed with patient? Yes  SHORT TERM GOALS: Target date: 04/23/23  Pt will be able to perform HEP for shoulder ROM/strengthening 4x/week to facilitate optimal long term outcome with  PT Baseline: initiated today Goal status: INITIAL    LONG TERM GOALS: Target date: 05/01/23   Improve shoulder strength (abd, IR) 1/2 MMT grade to promote improved ability to lift and perform his daily activities as a caregiver without being limited by L shoulder pain Baseline: 4+/5 Goal status: INITIAL  2.   Improve FOTO to 63 indicating pt able to perform daily activities without being limited by L shoulder pain Baseline: 43 Goal status: INITIAL  3.  Improve L shoulder AROM flexion to >150 deg to be able to perform overhead activities without being limited by L shoulder pain Baseline: AROM 105 Goal status: INITIAL    PLAN:  PT FREQUENCY: 1-2x/week  PT DURATION: 8 weeks  PLANNED INTERVENTIONS: 97110-Therapeutic exercises, 97530- Therapeutic activity, 97112- Neuromuscular re-education, 97535- Self Care, and 29562- Manual therapy  PLAN FOR NEXT SESSION: manual therapy for GH jt hypomobility, progress L shoulder AAROM/PROM as able to tol, assess scapular mm strength, L shoulder mm strengthening; also discuss body mechanics for caregiving duties to help him use his shoulder optimally (pt is caring for his Father right now and helping with transfers/mobility).  Max Fickle, PT, DPT, OCS  10:31 AM,03/27/23

## 2023-03-29 ENCOUNTER — Ambulatory Visit: Payer: Medicaid Other

## 2023-04-03 ENCOUNTER — Ambulatory Visit: Payer: Medicaid Other

## 2023-04-10 ENCOUNTER — Ambulatory Visit: Payer: Medicaid Other

## 2023-04-10 DIAGNOSIS — M25512 Pain in left shoulder: Secondary | ICD-10-CM | POA: Diagnosis not present

## 2023-04-10 DIAGNOSIS — M7522 Bicipital tendinitis, left shoulder: Secondary | ICD-10-CM

## 2023-04-10 DIAGNOSIS — M7542 Impingement syndrome of left shoulder: Secondary | ICD-10-CM

## 2023-04-10 NOTE — Therapy (Signed)
OUTPATIENT PHYSICAL THERAPY SHOULDER TREATMENT   Patient Name: Ian [name] MRN: [id number] DOB:Jul 16, 1980, 42 y.o., male Today's Date: 04/10/2023  END OF SESSION:  PT End of Session - 04/10/23 1031     Visit Number 4    Number of Visits 7    Date for PT Re-Evaluation 05/01/23    Authorization Type 1-2x/week x 6 weeks    Authorization Time Period requested 6 visits    PT Start Time 1030    PT Stop Time 1115    PT Time Calculation (min) 45 min    Activity Tolerance Patient tolerated treatment well    Behavior During Therapy [facility] for tasks assessed/performed              Past Medical History:  Diagnosis Date   Anxiety    Depression    Hypercholesteremia    Hypertension    Neuropathy    Panic disorder 03/24/2015   No past surgical history on file. 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   Depression, major, recurrent, in complete remission (HCC) 02/09/2015   Depression, major, recurrent, moderate (HCC) 02/09/2015    PCP: [facility]   REFERRING PROVIDER: Lanney Gins, PA-C  REFERRING DIAG: [phone] Pain in L shoulder  THERAPY DIAG:  Left shoulder pain, unspecified chronicity  Biceps tendonitis on left  Impingement syndrome of left shoulder  Rationale for Evaluation and Treatment: Rehabilitation  ONSET DATE: Early summer 2024  SUBJECTIVE:                                                                                                                                                                                      SUBJECTIVE STATEMENT: Pt reports his L shoulder started bothering him this summer.  He started  noticing a little catch in his shoulder.  He worked on building a fence and he was trying to use a chainsaw and his shoulder was sore after that.  There was no traumatic mechanics, no "pop".He went to ER and got some antiinflammatory treatment prednisone- it was helpful; he then noticed sx returned after the prednisone, saw his PCP and was referred to PT.  The place he was referred did not take his insurance so he went back to ER in October bc of the pain.  They did a toradol shot and it was helpful.  He then went for a  consult at [facility] in October.  Had x-rays (-) for fx.  He had a steroid shot at orthopedics- got some relief from that.  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 Yes   PRECAUTIONS: None  RED FLAGS: None   WEIGHT BEARING RESTRICTIONS: No  FALLS:  Has patient fallen in last 6 months? No  LIVING ENVIRONMENT: Lives with: lives with their family Lives in: House/apartment Stairs: yes, no issues Has following equipment at home: None  OCCUPATION: Not working  Currently helping with caregiving for his parents- has to physically lift and assist his Dad with transfers and mobility- he is the sore caregiver  He enjoys doing physical work at home   PLOF: Independent  PATIENT GOALS:to be able to perform his daily activities without being limited by L shoulder pain  NEXT MD VISIT:   OBJECTIVE:  Note: Objective measures were completed at  Evaluation unless otherwise noted.  DIAGNOSTIC FINDINGS:  X-rays of L shoulder- he states he was told he has some arthritis, maybe RTC tendonitis    PATIENT SURVEYS:  FOTO 43/63  COGNITION: Overall cognitive status: Within functional limits for tasks assessed     SENSATION: deferred  POSTURE: Forward head, rounded forward shoulders  UPPER EXTREMITY ROM:   Active ROM Right eval Left eval  Shoulder flexion 170 105 * PROM 145  Shoulder extension    Shoulder abduction    Shoulder adduction    Shoulder internal rotation HBB L  thumb to L2 HBB thumb to inf scap  Shoulder external rotation 85 45* PROM 65  Elbow flexion normal normal  Elbow extension normal normal  Wrist flexion    Wrist extension    Wrist ulnar deviation    Wrist radial deviation    Wrist pronation    Wrist supination    (Blank rows = not tested) *= pain UPPER EXTREMITY MMT:  MMT Right eval Left eval  Shoulder flexion 5/5 4+/5 *  Shoulder extension    Shoulder abduction  4+/5 *  Shoulder adduction    Shoulder internal rotation  4+/5 *  Shoulder external rotation  4+/5   Middle trapezius    Lower trapezius    Elbow flexion  4+/5 *  Elbow extension  4+/5  Wrist flexion    Wrist extension    Wrist ulnar deviation    Wrist radial deviation    Wrist pronation    Wrist supination    Grip strength (lbs)    (Blank rows = not tested)*= pain  SHOULDER SPECIAL TESTS: + painful arc L; + lateral jobe for pain and mild weakness L; (-) TTP proximal LH biceps tendon  JOINT MOBILITY TESTING:  Hypomobility L GH jt inf and AP glides  PALPATION:  Impaired scapulothoracic mm motor control- further assessment needed at next visit (+) MTP L infraspinatus, UT, pec maj  Cervical Spine screen: did not reproduce L shoulder pain with cervical spine AROM and OP and (-) Spurling's R  and L; deferred neural tension and full neuro screen    TODAY'S TREATMENT:    DATE: 04/10/23 Subjective: Pt reports overall like  he has "gone backwards" a little since his last visit.  He has been noticing an increase in pain and a little less motion in shoulder before he notices onset of pain.  He feels like the steroid shot is maybe wearing off.  But also hasn't been as consistent with HEP and has had some stressful life scenarios he's dealing with.                                                                                                                          Pain: 1-2/10 at rest upon arrival   Treatment:  Manual Therapy:  15 mins L shldr jt mobs- GH A/P and inferior glides Gr III/IV 30-40 sec bouts PROM in all planes MWM for flexion in sitting- clavicle/scapular assist x 10   Therapeutic Exercises: 30 minutes UBE: 5 mins 2.5/2.31min F/B-not today Shoulder 10x wand AAROM- not today Shoulder ER 10x wand xAAROM- not today  Serratus ant punches #3 2x12- not today Serratus/scapular mm activation at wall with red band: wall slide red band 8 reps, 3 sets Seated scapular retraction x15 with PT tactile cues Scap retraction with GTB 5 second holds x 20 Standing rows: BTB 2x15 Standing shoulder extension: BTB 2x15 Seated thoracic extension over chair 2x10- reviewed Bicep curls GTB x 60 secs- not today Tricep PREs with GTB x 60 secs- not today W to Y on wall x 60 secs- not today HEP Lower and Mid traps activation x 60 secs each. - not today HEP MWM shoulder flexion with band post/inf glide on clavicle x10 (used blue TB in door)- showed pt how to perform this for HEP  Pt education for shoulder mechanics during shoulder height and overhead reach activities; cue to firs achieve upright posture and then "keep thumb up" with reaching  PATIENT EDUCATION: Education details: PT POC/goals/HEP Person educated: Patient Education method: Explanation Education comprehension: verbalized understanding  HOME EXERCISE PROGRAM: Access Code: [email or web address] URL: [email or web address] Date: 04/10/2023 Prepared by:  Max Fickle  Exercises - Supine Shoulder Flexion Extension AAROM with Dowel  - 1 x daily - 7 x weekly - 3 sets - 10 reps - Scapular Retraction with Resistance  - 1 x daily - 7 x weekly - 3 sets - 10 reps - Standing Shoulder Shrug with Resistance  - 1 x daily - 7 x weekly - 3 sets - 10 reps - Shoulder extension with resistance - Neutral  - 1 x daily - 7 x weekly - 3 sets - 10 reps - Standing Single Arm Elbow Flexion with Resistance  - 1 x daily - 7 x weekly - 3 sets - 10 reps - Standing Shoulder Horizontal Abduction with Resistance  - 1 x daily - 7 x weekly - 3 sets - 10 reps - Low Trap Setting at Wall  - 1 x daily -  7 x weekly - 3 sets - 10 reps - Seated Thoracic Lumbar Extension  - 3 x daily - 7 x weekly - 10 reps - Serratus Activation at Wall with Foam Roll and Resistance Band  - 1 x daily - 4 x weekly - 3 sets - 8 reps  ASSESSMENT:  CLINICAL IMPRESSION: Patient responded well to manual therapy and MWM as he was able to perform active shoulder flexion AROM with less discomfort noted after inferior glides and MWM technique.  Challenged appropriately with scapular and RTC mm retraining.  Overall, he should continue to benefit from skilled PT to address impairments listed below and to facilitate return to PLOF including being able to carry, reach, lift, and perform caregiver duties without being limited by L shoulder pain.  OBJECTIVE IMPAIRMENTS: decreased activity tolerance, decreased ROM, decreased strength, hypomobility, improper body mechanics, postural dysfunction, and pain.   ACTIVITY LIMITATIONS: carrying, lifting, reach over head, and caring for others  PARTICIPATION LIMITATIONS: cleaning, interpersonal relationship, community activity, yard work, and caregiver duties for family  PERSONAL FACTORS: Past/current experiences and Time since onset of injury/illness/exacerbation are also affecting patient's functional outcome.   REHAB POTENTIAL: Good  CLINICAL DECISION MAKING:  Stable/uncomplicated  EVALUATION COMPLEXITY: Low   GOALS: Goals reviewed with patient? Yes  SHORT TERM GOALS: Target date: 04/23/23  Pt will be able to perform HEP for shoulder ROM/strengthening 4x/week to facilitate optimal long term outcome with PT Baseline: initiated today Goal status: INITIAL    LONG TERM GOALS: Target date: 05/01/23   Improve shoulder strength (abd, IR) 1/2 MMT grade to promote improved ability to lift and perform his daily activities as a caregiver without being limited by L shoulder pain Baseline: 4+/5 Goal status: INITIAL  2.   Improve FOTO to 63 indicating pt able to perform daily activities without being limited by L shoulder pain Baseline: 43 Goal status: INITIAL  3.  Improve L shoulder AROM flexion to >150 deg to be able to perform overhead activities without being limited by L shoulder pain Baseline: AROM 105 Goal status: INITIAL    PLAN:  PT FREQUENCY: 1-2x/week  PT DURATION: 8 weeks  PLANNED INTERVENTIONS: 97110-Therapeutic exercises, 97530- Therapeutic activity, 97112- Neuromuscular re-education, 97535- Self Care, and 16109- Manual therapy  PLAN FOR NEXT SESSION: manual therapy for GH jt hypomobility, progress L shoulder AAROM/PROM as able to tol, assess scapular mm strength, L shoulder mm strengthening; also discuss body mechanics for caregiving duties to help him use his shoulder optimally (pt is caring for his Father right now and helping with transfers/mobility).  Max Fickle, PT, DPT, OCS  10:31 AM,04/10/23

## 2023-04-17 ENCOUNTER — Ambulatory Visit: Payer: Medicaid Other

## 2023-04-17 DIAGNOSIS — M7542 Impingement syndrome of left shoulder: Secondary | ICD-10-CM

## 2023-04-17 DIAGNOSIS — M25512 Pain in left shoulder: Secondary | ICD-10-CM

## 2023-04-17 DIAGNOSIS — M7522 Bicipital tendinitis, left shoulder: Secondary | ICD-10-CM

## 2023-04-17 NOTE — Therapy (Signed)
OUTPATIENT PHYSICAL THERAPY SHOULDER TREATMENT   Patient Name: Ian [name] MRN: [id number] DOB:1980-11-16, 42 y.o., male Today's Date: 04/17/2023  END OF SESSION:  PT End of Session - 04/17/23 1035     Visit Number 5    Number of Visits 7    Date for PT Re-Evaluation 05/01/23    Authorization Type 1-2x/week x 6 weeks    Authorization Time Period requested 6 visits    PT Start Time 1030    PT Stop Time 1115    PT Time Calculation (min) 45 min    Activity Tolerance Patient tolerated treatment well    Behavior During Therapy [facility] for tasks assessed/performed              Past Medical History:  Diagnosis Date   Anxiety    Depression    Hypercholesteremia    Hypertension    Neuropathy    Panic disorder 03/24/2015   No past surgical history on file. 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   Depression, major, recurrent, in complete remission (HCC) 02/09/2015   Depression, major, recurrent, moderate (HCC) 02/09/2015    PCP: [facility]   REFERRING PROVIDER: Lanney Gins, PA-C  REFERRING DIAG: [phone] Pain in L shoulder  THERAPY DIAG:  Left shoulder pain, unspecified chronicity  Biceps tendonitis on left  Impingement syndrome of left shoulder  Rationale for Evaluation and Treatment: Rehabilitation  ONSET DATE: Early summer 2024  SUBJECTIVE:                                                                                                                                                                                      SUBJECTIVE STATEMENT: Pt reports his L shoulder started bothering him this summer.  He started  noticing a little catch in his shoulder.  He worked on building a fence and he was trying to use a chainsaw and his shoulder was sore after that.  There was no traumatic mechanics, no "pop".He went to ER and got some antiinflammatory treatment prednisone- it was helpful; he then noticed sx returned after the prednisone, saw his PCP and was referred to PT.  The place he was referred did not take his insurance so he went back to ER in October bc of the pain.  They did a toradol shot and it was helpful.  He then went for a  consult at [facility] in October.  Had x-rays (-) for fx.  He had a steroid shot at orthopedics- got some relief from that.  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 Yes   PRECAUTIONS: None  RED FLAGS: None   WEIGHT BEARING RESTRICTIONS: No  FALLS:  Has patient fallen in last 6 months? No  LIVING ENVIRONMENT: Lives with: lives with their family Lives in: House/apartment Stairs: yes, no issues Has following equipment at home: None  OCCUPATION: Not working  Currently helping with caregiving for his parents- has to physically lift and assist his Dad with transfers and mobility- he is the sore caregiver  He enjoys doing physical work at home   PLOF: Independent  PATIENT GOALS:to be able to perform his daily activities without being limited by L shoulder pain  NEXT MD VISIT:   OBJECTIVE:  Note: Objective measures were completed at  Evaluation unless otherwise noted.  DIAGNOSTIC FINDINGS:  X-rays of L shoulder- he states he was told he has some arthritis, maybe RTC tendonitis    PATIENT SURVEYS:  FOTO 43/63  COGNITION: Overall cognitive status: Within functional limits for tasks assessed     SENSATION: deferred  POSTURE: Forward head, rounded forward shoulders  UPPER EXTREMITY ROM:   Active ROM Right eval Left eval  Shoulder flexion 170 105 * PROM 145  Shoulder extension    Shoulder abduction    Shoulder adduction    Shoulder internal rotation HBB L  thumb to L2 HBB thumb to inf scap  Shoulder external rotation 85 45* PROM 65  Elbow flexion normal normal  Elbow extension normal normal  Wrist flexion    Wrist extension    Wrist ulnar deviation    Wrist radial deviation    Wrist pronation    Wrist supination    (Blank rows = not tested) *= pain UPPER EXTREMITY MMT:  MMT Right eval Left eval  Shoulder flexion 5/5 4+/5 *  Shoulder extension    Shoulder abduction  4+/5 *  Shoulder adduction    Shoulder internal rotation  4+/5 *  Shoulder external rotation  4+/5   Middle trapezius    Lower trapezius    Elbow flexion  4+/5 *  Elbow extension  4+/5  Wrist flexion    Wrist extension    Wrist ulnar deviation    Wrist radial deviation    Wrist pronation    Wrist supination    Grip strength (lbs)    (Blank rows = not tested)*= pain  SHOULDER SPECIAL TESTS: + painful arc L; + lateral jobe for pain and mild weakness L; (-) TTP proximal LH biceps tendon  JOINT MOBILITY TESTING:  Hypomobility L GH jt inf and AP glides  PALPATION:  Impaired scapulothoracic mm motor control- further assessment needed at next visit (+) MTP L infraspinatus, UT, pec maj  Cervical Spine screen: did not reproduce L shoulder pain with cervical spine AROM and OP and (-) Spurling's R  and L; deferred neural tension and full neuro screen    TODAY'S TREATMENT:    DATE: 04/17/23 Subjective: Pt reports overall he  feels a little better than last week.  He has been working on the new HEP; the MWM feels good.                                                                                                                          Pain: 1-2/10 at rest upon arrival   Treatment:  Manual Therapy:  15 mins L shldr jt mobs- GH A/P and inferior glides Gr III/IV 30-40 sec bouts PROM in all planes MWM for flexion in sitting- clavicle/scapular assist x 10- not today Scapulothoracic mobs- all directions x 1 min, gr IV   Therapeutic Exercises: 30 minutes UBE: 8 mins 4/4 F/B Shoulder 10x wand AAROM- not today Shoulder ER 10x wand xAAROM- not today  MRE scapular depression and retraction MRE: isometrics IR/ER at 45 deg ab 5 second holds x 15 ea, and at 90 deg abd 5 sec holds x15 ea  Serratus ant punches #3 2x12- not today Serratus/scapular mm activation at wall with red band: wall slide red band 8 reps, 3 sets Seated scapular retraction x15 with PT tactile cues Scap retraction with GTB 5 second holds x 15, 2 sets  Standing rows: BTB 2x15- reviewed for HEP Standing shoulder extension: BTB 2x15- reviewed reviewed for HEP Seated thoracic extension over chair 2x10- reviewed for HEP Bicep curls GTB x 60 secs- not today Tricep PREs with GTB x 60 secs- not today W to Y on wall x 60 secs- not today HEP Lower and Mid traps activation x 60 secs each. - not today HEP MWM shoulder flexion with band post/inf glide on clavicle x10 (used blue TB in door)- showed pt how to perform this for HEP  Pt education for shoulder mechanics during shoulder height and overhead reach activities; cue to firs achieve upright posture and then "keep thumb up" with reaching  PATIENT EDUCATION: Education details: PT POC/goals/HEP Person educated: Patient Education method: Explanation Education comprehension: verbalized understanding  HOME EXERCISE PROGRAM: Access Code: [email or web address] URL: [email or web address] Date:  04/10/2023 Prepared by: Max Fickle  Exercises - Supine Shoulder Flexion Extension AAROM with Dowel  - 1 x daily - 7 x weekly - 3 sets - 10 reps - Scapular Retraction with Resistance  - 1 x daily - 7 x weekly - 3 sets - 10 reps - Standing Shoulder Shrug with Resistance  - 1 x daily - 7 x weekly - 3 sets - 10 reps - Shoulder extension with resistance - Neutral  - 1 x daily - 7 x weekly - 3 sets - 10 reps - Standing Single Arm Elbow Flexion with Resistance  - 1 x daily - 7 x weekly - 3 sets - 10 reps - Standing Shoulder Horizontal Abduction with Resistance  - 1 x daily - 7 x weekly - 3 sets - 10 reps - Low Trap  Setting at Wall  - 1 x daily - 7 x weekly - 3 sets - 10 reps - Seated Thoracic Lumbar Extension  - 3 x daily - 7 x weekly - 10 reps - Serratus Activation at Wall with Foam Roll and Resistance Band  - 1 x daily - 4 x weekly - 3 sets - 8 reps  ASSESSMENT:  CLINICAL IMPRESSION: Posterior/inferior GH capsule mobility improved since starting PT.  Progressed with RTC and periscapular mm activation/motor control retraining today in various ranges of shoulder abd motion.  Pt tolerated well.  Still challenged appropriately with scapular and RTC mm retraining.  Reviewed what to work on for HEP between today and next session.  Overall, he should continue to benefit from skilled PT to address impairments listed below and to facilitate return to PLOF including being able to carry, reach, lift, and perform caregiver duties without being limited by L shoulder pain.  OBJECTIVE IMPAIRMENTS: decreased activity tolerance, decreased ROM, decreased strength, hypomobility, improper body mechanics, postural dysfunction, and pain.   ACTIVITY LIMITATIONS: carrying, lifting, reach over head, and caring for others  PARTICIPATION LIMITATIONS: cleaning, interpersonal relationship, community activity, yard work, and caregiver duties for family  PERSONAL FACTORS: Past/current experiences and Time since onset of  injury/illness/exacerbation are also affecting patient's functional outcome.   REHAB POTENTIAL: Good  CLINICAL DECISION MAKING: Stable/uncomplicated  EVALUATION COMPLEXITY: Low   GOALS: Goals reviewed with patient? Yes  SHORT TERM GOALS: Target date: 04/23/23  Pt will be able to perform HEP for shoulder ROM/strengthening 4x/week to facilitate optimal long term outcome with PT Baseline: initiated today Goal status: INITIAL    LONG TERM GOALS: Target date: 05/01/23   Improve shoulder strength (abd, IR) 1/2 MMT grade to promote improved ability to lift and perform his daily activities as a caregiver without being limited by L shoulder pain Baseline: 4+/5 Goal status: INITIAL  2.   Improve FOTO to 63 indicating pt able to perform daily activities without being limited by L shoulder pain Baseline: 43 Goal status: INITIAL  3.  Improve L shoulder AROM flexion to >150 deg to be able to perform overhead activities without being limited by L shoulder pain Baseline: AROM 105 Goal status: INITIAL    PLAN:  PT FREQUENCY: 1-2x/week  PT DURATION: 8 weeks  PLANNED INTERVENTIONS: 97110-Therapeutic exercises, 97530- Therapeutic activity, 97112- Neuromuscular re-education, 97535- Self Care, and 16109- Manual therapy  PLAN FOR NEXT SESSION: manual therapy for GH and ST joint as needd; progress scapular mm strength, L shoulder mm strengthening; next visit progress note/re-cert  Max Fickle, PT, DPT, OCS  11:26 AM,04/17/23

## 2023-04-24 ENCOUNTER — Ambulatory Visit: Payer: Medicaid Other | Attending: Orthopedic Surgery

## 2023-04-24 DIAGNOSIS — M7542 Impingement syndrome of left shoulder: Secondary | ICD-10-CM | POA: Diagnosis present

## 2023-04-24 DIAGNOSIS — M25512 Pain in left shoulder: Secondary | ICD-10-CM | POA: Diagnosis present

## 2023-04-24 DIAGNOSIS — M7522 Bicipital tendinitis, left shoulder: Secondary | ICD-10-CM | POA: Diagnosis present

## 2023-04-24 NOTE — Therapy (Signed)
OUTPATIENT PHYSICAL THERAPY SHOULDER TREATMENT   Patient Name: Ian [name] MRN: [id number] DOB:[birth year], 42 y.o., male Today's Date: 04/24/2023  END OF SESSION:  PT End of Session - 04/24/23 1049     Visit Number 6    Number of Visits 7    Date for PT Re-Evaluation 05/01/23    Authorization Type 1-2x/week x 6 weeks    Authorization Time Period requested 6 visits    PT Start Time 1045    PT Stop Time 1115    PT Time Calculation (min) 30 min    Activity Tolerance Patient tolerated treatment well    Behavior During Therapy [facility] for tasks assessed/performed              Past Medical History:  Diagnosis Date   Anxiety    Depression    Hypercholesteremia    Hypertension    Neuropathy    Panic disorder 03/24/2015   No past surgical history on file. 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   Depression, major, recurrent, in complete remission (HCC) 02/09/2015   Depression, major, recurrent, moderate (HCC) 02/09/2015    PCP: Phineas Real [facility]   REFERRING PROVIDER: Lanney Gins, PA-C  REFERRING DIAG: [phone] Pain in L shoulder  THERAPY DIAG:  Left shoulder pain, unspecified chronicity  Biceps tendonitis on left  Impingement syndrome of left shoulder  Rationale for Evaluation and Treatment: Rehabilitation  ONSET DATE: Early summer 2024  SUBJECTIVE:                                                                                                                                                                                      SUBJECTIVE STATEMENT: Pt reports his L shoulder started bothering him this summer.  He started  noticing a little catch in his shoulder.  He worked on building a fence and he was trying to use a chainsaw and his shoulder was sore after that.  There was no traumatic mechanics, no "pop".He went to ER and got some antiinflammatory treatment prednisone- it was helpful; he then noticed sx returned after the prednisone, saw his PCP and was referred to PT.  The place he was referred did not take his insurance so he went back to ER in October bc of the pain.  They did a toradol shot and it was helpful.  He then went for a  consult at [facility] in October.  Had x-rays (-) for fx.  He had a steroid shot at orthopedics- got some relief from that.  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 Yes   PRECAUTIONS: None  RED FLAGS: None   WEIGHT BEARING RESTRICTIONS: No  FALLS:  Has patient fallen in last 6 months? No  LIVING ENVIRONMENT: Lives with: lives with their family Lives in: House/apartment Stairs: yes, no issues Has following equipment at home: None  OCCUPATION: Not working  Currently helping with caregiving for his parents- has to physically lift and assist his Dad with transfers and mobility- he is the sore caregiver  He enjoys doing physical work at home   PLOF: Independent  PATIENT GOALS:to be able to perform his daily activities without being limited by L shoulder pain  NEXT MD VISIT:   OBJECTIVE:  Note: Objective measures were completed at  Evaluation unless otherwise noted.  DIAGNOSTIC FINDINGS:  X-rays of L shoulder- he states he was told he has some arthritis, maybe RTC tendonitis    PATIENT SURVEYS:  FOTO 43/63  COGNITION: Overall cognitive status: Within functional limits for tasks assessed     SENSATION: deferred  POSTURE: Forward head, rounded forward shoulders  UPPER EXTREMITY ROM:   Active ROM Right eval Left eval  Shoulder flexion 170 105 * PROM 145  Shoulder extension    Shoulder abduction    Shoulder adduction    Shoulder internal rotation HBB L  thumb to L2 HBB thumb to inf scap  Shoulder external rotation 85 45* PROM 65  Elbow flexion normal normal  Elbow extension normal normal  Wrist flexion    Wrist extension    Wrist ulnar deviation    Wrist radial deviation    Wrist pronation    Wrist supination    (Blank rows = not tested) *= pain UPPER EXTREMITY MMT:  MMT Right eval Left eval  Shoulder flexion 5/5 4+/5 *  Shoulder extension    Shoulder abduction  4+/5 *  Shoulder adduction    Shoulder internal rotation  4+/5 *  Shoulder external rotation  4+/5   Middle trapezius    Lower trapezius    Elbow flexion  4+/5 *  Elbow extension  4+/5  Wrist flexion    Wrist extension    Wrist ulnar deviation    Wrist radial deviation    Wrist pronation    Wrist supination    Grip strength (lbs)    (Blank rows = not tested)*= pain  SHOULDER SPECIAL TESTS: + painful arc L; + lateral jobe for pain and mild weakness L; (-) TTP proximal LH biceps tendon  JOINT MOBILITY TESTING:  Hypomobility L GH jt inf and AP glides  PALPATION:  Impaired scapulothoracic mm motor control- further assessment needed at next visit (+) MTP L infraspinatus, UT, pec maj  Cervical Spine screen: did not reproduce L shoulder pain with cervical spine AROM and OP and (-) Spurling's R  and L; deferred neural tension and full neuro screen    TODAY'S TREATMENT:    DATE: 04/24/23 Subjective: Pt reports overall he is  feeling better; his shoulder is sore                                                                                                                         Pain: 1-2/10 at rest upon arrival   Treatment:  Manual Therapy:  15 mins- not today L shldr jt mobs- GH A/P and inferior glides Gr III/IV 30-40 sec bouts PROM in all planes MWM for flexion in sitting- clavicle/scapular assist x 10- not today Scapulothoracic mobs- all directions x 1 min, gr IV  Manual Therapy: 5 minutes PROM flexion, abd, ER, IR, assessment of GH jt mobility   Therapeutic Exercises: 20 minutes UBE: 8 mins 4/4 F/B- not today Shoulder 10x wand AAROM Shoulder ER 10x wand xAAROM  MRE scapular depression and retraction MRE: isometrics IR/ER at  0 deg abd, and then 45 deg ab 5 second holds x 15 ea, and at 90 deg abd 5 sec holds x15 ea S/L ER: 3 lbs 2x15 (to fatigue) Prone row: 5 lb 2x15 (to fatigue) Prone Y: 0# 2x12 (to fatigue  Not today Serratus ant punches #3 2x12- not today Serratus/scapular mm activation at wall with red band: wall slide red band 8 reps, 3 sets Seated scapular retraction x15 with PT tactile cues- not today Scap retraction with GTB 5 second holds x 15, 2 sets Standing rows: BTB 2x15- reviewed for HEP Standing shoulder extension: BTB 2x15- reviewed reviewed for HEP Seated thoracic extension over chair 2x10- reviewed for HEP Bicep curls GTB x 60 secs- not today Tricep PREs with GTB x 60 secs- not today W to Y on wall x 60 secs- not today HEP Lower and Mid traps activation x 60 secs each. - not today HEP MWM shoulder flexion with band post/inf glide on clavicle x10 (used blue TB in door)- showed pt how to perform this for HEP  Pt education for shoulder mechanics during shoulder height and overhead reach activities; cue to first achieve upright posture and then "keep thumb up" with reaching  PATIENT EDUCATION: Education details: PT POC/goals/HEP Person educated: Patient Education method:  Explanation Education comprehension: verbalized understanding  HOME EXERCISE PROGRAM: Access Code: [email or web address] URL: [email or web address] Date: 04/10/2023 Prepared by: Max Fickle  Exercises - Supine Shoulder Flexion Extension AAROM with Dowel  - 1 x daily - 7 x weekly - 3 sets - 10 reps - Scapular Retraction with Resistance  - 1 x daily - 7 x weekly - 3 sets - 10 reps - Standing Shoulder Shrug with Resistance  - 1 x daily - 7 x weekly - 3 sets - 10 reps - Shoulder extension with resistance - Neutral  - 1 x daily - 7 x weekly - 3 sets - 10 reps - Standing Single Arm Elbow Flexion with Resistance  - 1 x daily - 7 x weekly -  3 sets - 10 reps - Standing Shoulder Horizontal Abduction with Resistance  - 1 x daily - 7 x weekly - 3 sets - 10 reps - Low Trap Setting at Wall  - 1 x daily - 7 x weekly - 3 sets - 10 reps - Seated Thoracic Lumbar Extension  - 3 x daily - 7 x weekly - 10 reps - Serratus Activation at Wall with Foam Roll and Resistance Band  - 1 x daily - 4 x weekly - 3 sets - 8 reps  ASSESSMENT:  CLINICAL IMPRESSION: Posterior/inferior GH capsule mobility improved since starting PT.  Abbreviated tx session implemented today as pt arrived and was seen at a different time than he was scheduled for originally today.  Overall, tolerated PTC and periscapular mm retraining well today with no increase in soreness noted at end of session.  Progressed with RTC and periscapular mm activation/motor control retraining today in various ranges of shoulder abd motion.  Still challenged appropriately with scapular and RTC mm retraining.  Reviewed what to work on for HEP between today and next session.  Overall, he should continue to benefit from skilled PT to address impairments listed below and to facilitate return to PLOF including being able to carry, reach, lift, and perform caregiver duties without being limited by L shoulder pain.  OBJECTIVE IMPAIRMENTS: decreased activity  tolerance, decreased ROM, decreased strength, hypomobility, improper body mechanics, postural dysfunction, and pain.   ACTIVITY LIMITATIONS: carrying, lifting, reach over head, and caring for others  PARTICIPATION LIMITATIONS: cleaning, interpersonal relationship, community activity, yard work, and caregiver duties for family  PERSONAL FACTORS: Past/current experiences and Time since onset of injury/illness/exacerbation are also affecting patient's functional outcome.   REHAB POTENTIAL: Good  CLINICAL DECISION MAKING: Stable/uncomplicated  EVALUATION COMPLEXITY: Low   GOALS: Goals reviewed with patient? Yes  SHORT TERM GOALS: Target date: 04/23/23  Pt will be able to perform HEP for shoulder ROM/strengthening 4x/week to facilitate optimal long term outcome with PT Baseline: initiated today Goal status: INITIAL    LONG TERM GOALS: Target date: 05/01/23   Improve shoulder strength (abd, IR) 1/2 MMT grade to promote improved ability to lift and perform his daily activities as a caregiver without being limited by L shoulder pain Baseline: 4+/5 Goal status: INITIAL  2.   Improve FOTO to 63 indicating pt able to perform daily activities without being limited by L shoulder pain Baseline: 43 Goal status: INITIAL  3.  Improve L shoulder AROM flexion to >150 deg to be able to perform overhead activities without being limited by L shoulder pain Baseline: AROM 105 Goal status: INITIAL    PLAN:  PT FREQUENCY: 1-2x/week  PT DURATION: 8 weeks  PLANNED INTERVENTIONS: 97110-Therapeutic exercises, 97530- Therapeutic activity, 97112- Neuromuscular re-education, 97535- Self Care, and 30865- Manual therapy  PLAN FOR NEXT SESSION: manual therapy for GH and ST joint as needd; progress scapular mm strength, L shoulder mm strengthening; next visit progress note/re-cert  Max Fickle, PT, DPT, OCS  10:49 AM,04/24/23

## 2023-05-01 ENCOUNTER — Ambulatory Visit: Payer: Medicaid Other

## 2023-05-01 DIAGNOSIS — M7542 Impingement syndrome of left shoulder: Secondary | ICD-10-CM

## 2023-05-01 DIAGNOSIS — M25512 Pain in left shoulder: Secondary | ICD-10-CM | POA: Diagnosis not present

## 2023-05-01 DIAGNOSIS — M7522 Bicipital tendinitis, left shoulder: Secondary | ICD-10-CM

## 2023-05-01 NOTE — Therapy (Signed)
OUTPATIENT PHYSICAL THERAPY SHOULDER TREATMENT/PROGRESS NOTE/RE-CERTIFICATION NOTE through 06/12/23   Patient Name: Ian [name] MRN: [id number] DOB:04/09/1981, 42 y.o., male Today's Date: 05/01/2023  END OF SESSION:  PT End of Session - 05/01/23 1555     Visit Number 7    Number of Visits 13    Date for PT Re-Evaluation 06/12/23    Authorization Type PN/recert done on 05/01/23; additional 1-2x/week x 6 weeks (through 06/12/23)    Authorization Time Period requested 6 visits    PT Start Time 1545    PT Stop Time 1630    PT Time Calculation (min) 45 min    Activity Tolerance Patient tolerated treatment well    Behavior During Therapy [facility] for tasks assessed/performed              Past Medical History:  Diagnosis Date   Anxiety    Depression    Hypercholesteremia    Hypertension    Neuropathy    Panic disorder 03/24/2015   No past surgical history on file. 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   Depression, major, recurrent, in complete remission (HCC) 02/09/2015   Depression, major, recurrent, moderate (HCC) 02/09/2015    PCP: Phineas Real [facility]   REFERRING PROVIDER: Lanney Gins, PA-C  REFERRING DIAG: [phone] Pain in L shoulder  THERAPY DIAG:  Left shoulder pain, unspecified chronicity  Biceps tendonitis on left  Impingement syndrome of left shoulder  Rationale for Evaluation and Treatment: Rehabilitation  ONSET DATE: Early summer 2024  SUBJECTIVE:                                                                                                                                                                                       SUBJECTIVE STATEMENT: Pt reports his L shoulder started bothering him this summer.  He started noticing a little catch in his shoulder.  He worked on building a fence and he was trying to use a chainsaw and his shoulder was sore after that.  There was no traumatic mechanics, no "pop".He went to ER and got some antiinflammatory treatment prednisone- it was helpful; he then noticed sx returned after the prednisone, saw his PCP and was referred to PT.  The place he was referred did not take his insurance so he went back to ER in October bc of the pain.  They did a toradol  shot and it was helpful.  He then went for a consult at [facility] in October.  Had x-rays (-) for fx.  He had a steroid shot at orthopedics- got some relief from that.  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 Yes   PRECAUTIONS: None  RED FLAGS: None   WEIGHT BEARING RESTRICTIONS: No  FALLS:  Has patient fallen in last 6 months? No  LIVING ENVIRONMENT: Lives with: lives with their family Lives in: House/apartment Stairs: yes, no issues Has following equipment at home: None  OCCUPATION: Not working  Currently helping with caregiving for his parents- has to physically lift and assist his Dad with transfers and mobility- he is the sore caregiver  He enjoys doing physical work at home   PLOF: Independent  PATIENT GOALS:to be able to perform his daily activities without being  limited by L shoulder pain  NEXT MD VISIT:   OBJECTIVE:  Note: Objective measures were completed at Evaluation unless otherwise noted.  DIAGNOSTIC FINDINGS:  X-rays of L shoulder- he states he was told he has some arthritis, maybe RTC tendonitis    PATIENT SURVEYS:  FOTO 43/63  COGNITION: Overall cognitive status: Within functional limits for tasks assessed     SENSATION: deferred  POSTURE: Forward head, rounded forward shoulders  UPPER EXTREMITY ROM:   Active ROM Right eval Left eval  Shoulder flexion 170 105 * PROM 145  Shoulder extension    Shoulder abduction    Shoulder adduction    Shoulder internal rotation HBB L  thumb to L2 HBB thumb to inf scap  Shoulder external rotation 85 45* PROM 65  Elbow flexion normal normal  Elbow extension normal normal  Wrist flexion    Wrist extension    Wrist ulnar deviation    Wrist radial deviation    Wrist pronation    Wrist supination    (Blank rows = not tested) *= pain UPPER EXTREMITY MMT:  MMT Right eval Left eval  Shoulder flexion 5/5 4+/5 *  Shoulder extension    Shoulder abduction  4+/5 *  Shoulder adduction    Shoulder internal rotation  4+/5 *  Shoulder external rotation  4+/5   Middle trapezius    Lower trapezius    Elbow flexion  4+/5 *  Elbow extension  4+/5  Wrist flexion    Wrist extension    Wrist ulnar deviation    Wrist radial deviation    Wrist pronation    Wrist supination    Grip strength (lbs)    (Blank rows = not tested)*= pain  SHOULDER SPECIAL TESTS: + painful arc L; + lateral jobe for pain and mild weakness L; (-) TTP proximal LH biceps tendon  JOINT MOBILITY TESTING:  Hypomobility L GH jt inf and AP glides  PALPATION:  Impaired scapulothoracic mm motor control- further assessment needed at next visit (+) MTP L infraspinatus, UT, pec maj  Cervical Spine screen: did not reproduce L shoulder  pain with cervical spine AROM and OP and (-) Spurling's R and L; deferred neural  tension and full neuro screen    TODAY'S TREATMENT:    DATE: 04/24/23 Subjective: Pt reports overall he is feeling better; his shoulder is sore     Objective: Progress Note                                                                                                                   Pain: 1-2/10 at rest upon arrival FOTO: 59/63 (was 43 at initial evaluation)  L shoulder AROM:  L flexion 150, pain with lowering (was 105* at initial eval) L abd 160   HBB to T12 ER 55 deg (was 45* at initial eval) *= pain   L shoulder strength: Flexion 4+/5 Abd 4+/5 ER 4+/5 IR 4+/5 * pt notes sensation of "instability" *= pain  Treatment:  Manual Therapy:  15 mins- not today L shldr jt mobs- GH A/P and inferior glides Gr III/IV 30-40 sec bouts PROM in all planes MWM for flexion in sitting- clavicle/scapular assist x 10- not today Scapulothoracic mobs- all directions x 1 min, gr IV PROM flexion, abd, ER, IR, assessment of GH jt mobility   Therapeutic Exercises:  UBE: 8 mins 4/4 F/B Shoulder 10x wand AAROM Shoulder ER 10x wand xAAROM MRE: isometrics IR/ER at  0 deg abd, and then 45 deg ab 5 second holds x 15 ea, and at 90 deg abd 5 sec holds x15 ea Rhythmic stab at 90 deg flexion supine: 30 seconds x 3 Plank on elevated table (counter height): 30 second intervals x 3; added weight shifts x10; added serratus push up 2x10- pt reports "instability feeling"   Not today MRE scapular depression and retraction S/L ER: 3 lbs 2x15 (to fatigue) Prone row: 5 lb 2x15 (to fatigue) Prone Y: 0# 2x12 (to fatigue Serratus ant punches #3 2x12- not today Serratus/scapular mm activation at wall with red band: wall slide red band 8 reps, 3 sets Seated scapular retraction x15 with PT tactile cues- not today Scap retraction with GTB 5 second holds x 15, 2 sets Standing rows: BTB 2x15- reviewed for HEP Standing shoulder extension: BTB 2x15- reviewed reviewed for HEP Seated thoracic extension over chair  2x10- reviewed for HEP Bicep curls GTB x 60 secs- not today Tricep PREs with GTB x 60 secs- not today W to Y on wall x 60 secs- not today HEP Lower and Mid traps activation x 60 secs each. - not today HEP MWM shoulder flexion with band post/inf glide on clavicle x10 (used blue TB in door)- showed pt how to perform this for HEP  Pt education for shoulder mechanics during shoulder height and overhead reach activities; cue to first achieve upright posture and then "keep thumb up" with reaching  PATIENT EDUCATION: Education details: PT POC/goals/HEP Person educated: Patient Education method: Explanation Education comprehension: verbalized understanding  HOME EXERCISE PROGRAM: Access Code: [email or web address] URL: [email or web address] Date: 05/01/2023 Prepared by: Max Fickle  Exercises - Supine Shoulder Flexion  Extension AAROM with Dowel  - 1 x daily - 7 x weekly - 3 sets - 10 reps - Scapular Retraction with Resistance  - 1 x daily - 7 x weekly - 3 sets - 10 reps - Standing Shoulder Shrug with Resistance  - 1 x daily - 7 x weekly - 3 sets - 10 reps - Shoulder extension with resistance - Neutral  - 1 x daily - 7 x weekly - 3 sets - 10 reps - Standing Single Arm Elbow Flexion with Resistance  - 1 x daily - 7 x weekly - 3 sets - 10 reps - Standing Shoulder Horizontal Abduction with Resistance  - 1 x daily - 7 x weekly - 3 sets - 10 reps - Low Trap Setting at Wall  - 1 x daily - 7 x weekly - 3 sets - 10 reps - Seated Thoracic Lumbar Extension  - 3 x daily - 7 x weekly - 10 reps - Serratus Activation at Wall with Foam Roll and Resistance Band  - 1 x daily - 4 x weekly - 3 sets - 8 reps - Plank with Hands on Table  - 1 x daily - 3 x weekly - 3 sets - 30 hold  ASSESSMENT:  CLINICAL IMPRESSION: Pt is making progress towards PT goals with current with physical therapy tx plan.  Shoulder AROM is improving and pt notes less pain with AROM testing.  FOTO also is showing improvement.   Overall functionally he still has difficulty with overhead movements and would benefit from continuing PT to focus on strength/dynamic stabilization required for overhead activities.  Still challenged appropriately with scapular and RTC mm retraining.  He should continue to benefit from skilled PT to address impairments listed below and to facilitate return to PLOF including being able to carry, reach, lift, and perform caregiver duties without being limited by L shoulder pain.  Likely has not met maximum benefit from skilled PT; recommend continued tx at this time.   OBJECTIVE IMPAIRMENTS: decreased activity tolerance, decreased ROM, decreased strength, hypomobility, improper body mechanics, postural dysfunction, and pain.   ACTIVITY LIMITATIONS: carrying, lifting, reach over head, and caring for others  PARTICIPATION LIMITATIONS: cleaning, interpersonal relationship, community activity, yard work, and caregiver duties for family  PERSONAL FACTORS: Past/current experiences and Time since onset of injury/illness/exacerbation are also affecting patient's functional outcome.   REHAB POTENTIAL: Good  CLINICAL DECISION MAKING: Stable/uncomplicated  EVALUATION COMPLEXITY: Low   GOALS: Goals reviewed with patient? Yes  SHORT TERM GOALS: Target date: 04/23/23  Pt will be able to perform HEP for shoulder ROM/strengthening 4x/week to facilitate optimal long term outcome with PT Baseline: initiated today Goal status: INITIAL   LONG TERM GOALS: Target date: 06/12/23  Improve shoulder strength (abd, IR) 1/2 MMT grade to promote improved ability to lift and perform his daily activities as a caregiver without being limited by L shoulder pain Baseline: 4+/5 05/01/23: 4+/5 Goal status: In progress  2.   Improve FOTO to 63 indicating pt able to perform daily activities without being limited by L shoulder pain Baseline: 43; 05/01/23: 59 Goal status: In progress  3.  Improve L shoulder AROM flexion to  >150 deg to be able to perform overhead activities without being limited by L shoulder pain Baseline: AROM 105, 05/01/23 150 deg Goal status: In progress  4.  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

## 2023-05-08 ENCOUNTER — Ambulatory Visit: Payer: Medicaid Other

## 2023-05-08 DIAGNOSIS — M7522 Bicipital tendinitis, left shoulder: Secondary | ICD-10-CM

## 2023-05-08 DIAGNOSIS — M25512 Pain in left shoulder: Secondary | ICD-10-CM

## 2023-05-08 DIAGNOSIS — M7542 Impingement syndrome of left shoulder: Secondary | ICD-10-CM

## 2023-05-08 NOTE — Therapy (Signed)
OUTPATIENT PHYSICAL THERAPY SHOULDER TREATMENT/PROGRESS NOTE/RE-CERTIFICATION NOTE through 06/12/23   Patient Name: Ian [name] MRN: [id number] DOB:1980/12/17, 42 y.o., male Today's Date: 05/08/2023  END OF SESSION:  PT End of Session - 05/08/23 1026     Visit Number 8    Number of Visits 13    Date for PT Re-Evaluation 06/12/23    Authorization Type PN/recert done on 05/01/23; additional 1-2x/week x 6 weeks (through 06/12/23)    Authorization Time Period requested 6 visits    PT Start Time 1030    PT Stop Time 1115    PT Time Calculation (min) 45 min    Activity Tolerance Patient tolerated treatment well    Behavior During Therapy [facility] for tasks assessed/performed              Past Medical History:  Diagnosis Date   Anxiety    Depression    Hypercholesteremia    Hypertension    Neuropathy    Panic disorder 03/24/2015   No past surgical history on file. 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   Depression, major, recurrent, in complete remission (HCC) 02/09/2015   Depression, major, recurrent, moderate (HCC) 02/09/2015    PCP: [facility]   REFERRING PROVIDER: Lanney Gins, PA-C  REFERRING DIAG: M25.512 Pain in L shoulder  THERAPY DIAG:  No diagnosis found.  Rationale for Evaluation and Treatment: Rehabilitation  ONSET DATE: Early summer 2024  SUBJECTIVE:                                                                                                                                                                                      SUBJECTIVE STATEMENT: Pt reports his L shoulder started bothering him this  summer.  He started noticing a little catch in his shoulder.  He worked on building a fence and he was trying to use a chainsaw and his shoulder was sore after that.  There was no traumatic mechanics, no "pop".He went to ER and got some antiinflammatory treatment prednisone- it was helpful; he then noticed sx returned after the prednisone, saw his PCP and was referred to PT.  The place he was referred did not take his insurance so he went back to ER in October bc of the pain.  They did a toradol shot and it was helpful.  He then went for a consult at  [facility] in October.  Had x-rays (-) for fx.  He had a steroid shot at orthopedics- got some relief from that.  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 Yes   PRECAUTIONS: None  RED FLAGS: None   WEIGHT BEARING RESTRICTIONS: No  FALLS:  Has patient fallen in last 6 months? No  LIVING ENVIRONMENT: Lives with: lives with their family Lives in: House/apartment Stairs: yes, no issues Has following equipment at home: None  OCCUPATION: Not working  Currently helping with caregiving for his parents- has to physically lift and assist his Dad with transfers and mobility- he is the sore caregiver  He enjoys doing physical work at home   PLOF: Independent  PATIENT GOALS:to be able to perform his daily activities without being limited by L shoulder pain  NEXT MD VISIT:   OBJECTIVE:  Note: Objective measures  were completed at Evaluation unless otherwise noted.  DIAGNOSTIC FINDINGS:  X-rays of L shoulder- he states he was told he has some arthritis, maybe RTC tendonitis    PATIENT SURVEYS:  FOTO 43/63  COGNITION: Overall cognitive status: Within functional limits for tasks assessed     SENSATION: deferred  POSTURE: Forward head, rounded forward shoulders  UPPER EXTREMITY ROM:   Active ROM Right eval Left eval  Shoulder flexion 170 105 * PROM 145  Shoulder extension    Shoulder abduction    Shoulder adduction    Shoulder internal rotation HBB L  thumb to L2 HBB thumb to inf scap  Shoulder external rotation 85 45* PROM 65  Elbow flexion normal normal  Elbow extension normal normal  Wrist flexion    Wrist extension    Wrist ulnar deviation    Wrist radial deviation    Wrist pronation    Wrist supination    (Blank rows = not tested) *= pain UPPER EXTREMITY MMT:  MMT Right eval Left eval  Shoulder flexion 5/5 4+/5 *  Shoulder extension    Shoulder abduction  4+/5 *  Shoulder adduction    Shoulder internal rotation  4+/5 *  Shoulder external rotation  4+/5   Middle trapezius    Lower trapezius    Elbow flexion  4+/5 *  Elbow extension  4+/5  Wrist flexion    Wrist extension    Wrist ulnar deviation    Wrist radial deviation    Wrist pronation    Wrist supination    Grip strength (lbs)    (Blank rows = not tested)*= pain  SHOULDER SPECIAL TESTS: + painful arc L; + lateral jobe for pain and mild weakness L; (-) TTP proximal LH biceps tendon  JOINT MOBILITY TESTING:  Hypomobility L GH jt inf and AP glides  PALPATION:  Impaired scapulothoracic mm motor control- further assessment needed at next visit (+) MTP L infraspinatus, UT, pec maj  Cervical Spine screen: did not reproduce L shoulder pain with cervical spine AROM and OP and (-) Spurling's R and L;  deferred neural tension and full neuro screen    TODAY'S TREATMENT:    DATE: 05/08/23 Subjective: Pt  reports he has no new complaints since last PT session; during his daily routine he notices shoulder pain with reaching out/up and having L arm on steering wheel on top.      Objective: Progress Note from 05/01/23                                                                                                                 Pain: 1-2/10 at rest upon arrival FOTO: 59/63 (was 43 at initial evaluation)  L shoulder AROM:  L flexion 150, pain with lowering (was 105* at initial eval) L abd 160   HBB to T12 ER 55 deg (was 45* at initial eval) *= pain   L shoulder strength: Flexion 4+/5 Abd 4+/5 ER 4+/5 IR 4+/5 * pt notes sensation of "instability" *= pain  Treatment today:  (+) pain with resisted L IR  (+) pain with overhead reach (+) biceps II load L (+) crank L (+) "catch"/"click" with L shoulder AROM at end ranges flexion, ER, overhead reach  Therapeutic Exercises:  UBE: 8 mins 4/4 F/B Shoulder 10x wand AAROM- not today Shoulder ER 10x wand xAAROM- not today MRE: isometrics IR/ER at  0 deg abd, and then 45 deg ab 5 second holds x 15 ea, and at 90 deg abd 5 sec holds x15 ea Rhythmic stab at 90 deg flexion supine: 30 seconds x 3 Plank on elevated table (counter height): 30 second intervals x 3 Standing rows: BTB 2x15 Standing shoulder extension: BTB 2x15 Step out RTC isometrics: green band 5 second holds 2x6 ea direction Serratus/scapular mm activation at wall with red band: wall slide red band 8 reps, 3 sets  Manual Therapy:  15 mins- not today L shldr jt mobs- GH A/P and inferior glides Gr III/IV 30-40 sec bouts PROM in all planes MWM for flexion in sitting- clavicle/scapular assist x 10- not today Scapulothoracic mobs- all directions x 1 min, gr IV PROM flexion, abd, ER, IR, assessment of GH jt mobility  Not today MRE scapular depression and retraction S/L ER: 3 lbs 2x15 (to fatigue) Prone row: 5 lb 2x15 (to fatigue) Prone Y: 0# 2x12 (to fatigue Serratus ant punches  #3 2x12- not today Seated scapular retraction x15 with PT tactile cues- not today Scap retraction with GTB 5 second holds x 15, 2 sets   Seated thoracic extension over chair 2x10- reviewed for HEP Bicep curls GTB x 60 secs- not today Tricep PREs with GTB x 60 secs- not today W to Y on wall x 60 secs- not today HEP Lower and Mid traps activation x 60 secs each. - not today HEP MWM shoulder flexion with band post/inf glide on clavicle x10 (used blue TB in door)- showed pt how to perform this for HEP  Pt education for shoulder mechanics during shoulder height and overhead reach activities; cue to first achieve upright posture and then "keep thumb up" with  reaching  PATIENT EDUCATION: Education details: PT POC/goals/HEP Person educated: Patient Education method: Explanation Education comprehension: verbalized understanding  HOME EXERCISE PROGRAM: Access Code: [email or web address] URL: [email or web address] Date: 05/01/2023 Prepared by: Max Fickle  Exercises - Supine Shoulder Flexion Extension AAROM with Dowel  - 1 x daily - 7 x weekly - 3 sets - 10 reps - Scapular Retraction with Resistance  - 1 x daily - 7 x weekly - 3 sets - 10 reps - Standing Shoulder Shrug with Resistance  - 1 x daily - 7 x weekly - 3 sets - 10 reps - Shoulder extension with resistance - Neutral  - 1 x daily - 7 x weekly - 3 sets - 10 reps - Standing Single Arm Elbow Flexion with Resistance  - 1 x daily - 7 x weekly - 3 sets - 10 reps - Standing Shoulder Horizontal Abduction with Resistance  - 1 x daily - 7 x weekly - 3 sets - 10 reps - Low Trap Setting at Wall  - 1 x daily - 7 x weekly - 3 sets - 10 reps - Seated Thoracic Lumbar Extension  - 3 x daily - 7 x weekly - 10 reps - Serratus Activation at Wall with Foam Roll and Resistance Band  - 1 x daily - 4 x weekly - 3 sets - 8 reps - Plank with Hands on Table  - 1 x daily - 3 x weekly - 3 sets - 30 hold  ASSESSMENT:  CLINICAL IMPRESSION: Pt with (+)  signs consistent with suspected labral pathology.  Overall functionally he still has difficulty with overhead movements and would benefit from continuing PT to focus on strength/dynamic stabilization required for overhead activities.  Still challenged appropriately with scapular and RTC mm retraining.  He should continue to benefit from skilled PT to address impairments listed below and to facilitate return to PLOF including being able to carry, reach, lift, and perform caregiver duties without being limited by L shoulder pain.  Likely has not met maximum benefit from skilled PT; recommend continued tx at this time.   OBJECTIVE IMPAIRMENTS: decreased activity tolerance, decreased ROM, decreased strength, hypomobility, improper body mechanics, postural dysfunction, and pain.   ACTIVITY LIMITATIONS: carrying, lifting, reach over head, and caring for others  PARTICIPATION LIMITATIONS: cleaning, interpersonal relationship, community activity, yard work, and caregiver duties for family  PERSONAL FACTORS: Past/current experiences and Time since onset of injury/illness/exacerbation are also affecting patient's functional outcome.   REHAB POTENTIAL: Good  CLINICAL DECISION MAKING: Stable/uncomplicated  EVALUATION COMPLEXITY: Low   GOALS: Goals reviewed with patient? Yes  SHORT TERM GOALS: Target date: 04/23/23  Pt will be able to perform HEP for shoulder ROM/strengthening 4x/week to facilitate optimal long term outcome with PT Baseline: initiated today Goal status: INITIAL   LONG TERM GOALS: Target date: 06/12/23  Improve shoulder strength (abd, IR) 1/2 MMT grade to promote improved ability to lift and perform his daily activities as a caregiver without being limited by L shoulder pain Baseline: 4+/5 05/01/23: 4+/5 Goal status: In progress  2.   Improve FOTO to 63 indicating pt able to perform daily activities without being limited by L shoulder pain Baseline: 43; 05/01/23: 59 Goal status: In  progress  3.  Improve L shoulder AROM flexion to >150 deg to be able to perform overhead activities without being limited by L shoulder pain Baseline: AROM 105, 05/01/23 150 deg Goal status: In progress  4.  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

## 2023-05-15 ENCOUNTER — Ambulatory Visit: Payer: Medicaid Other

## 2023-05-15 DIAGNOSIS — M7522 Bicipital tendinitis, left shoulder: Secondary | ICD-10-CM

## 2023-05-15 DIAGNOSIS — M25512 Pain in left shoulder: Secondary | ICD-10-CM

## 2023-05-15 NOTE — Therapy (Signed)
OUTPATIENT PHYSICAL THERAPY SHOULDER TREATMENT/PROGRESS NOTE/RE-CERTIFICATION NOTE through 06/12/23   Patient Name: Ian [name] MRN: [id number] DOB:12-31-1980, 42 y.o., male Today's Date: 05/15/2023  END OF SESSION:  PT End of Session - 05/15/23 1053     Visit Number 9    Number of Visits 13    Date for PT Re-Evaluation 06/12/23    Authorization Type PN/recert done on 05/01/23; additional 1-2x/week x 6 weeks (through 06/12/23)    Authorization Time Period requested 6 visits    PT Start Time 1030    PT Stop Time 1115    PT Time Calculation (min) 45 min    Activity Tolerance Patient tolerated treatment well    Behavior During Therapy [facility] for tasks assessed/performed              Past Medical History:  Diagnosis Date   Anxiety    Depression    Hypercholesteremia    Hypertension    Neuropathy    Panic disorder 03/24/2015   No past surgical history on file. 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   Depression, major, recurrent, in complete remission (HCC) 02/09/2015   Depression, major, recurrent, moderate (HCC) 02/09/2015    PCP: [facility]   REFERRING PROVIDER: Lanney Gins, PA-C  REFERRING DIAG: [phone] Pain in L shoulder  THERAPY DIAG:  Left shoulder pain, unspecified chronicity  Biceps tendonitis on left  Rationale for Evaluation and Treatment: Rehabilitation  ONSET DATE: Early summer 2024  SUBJECTIVE:                                                                                                                                                                                      SUBJECTIVE STATEMENT: Pt  reports his L shoulder started bothering him this summer.  He started noticing a little catch in his shoulder.  He worked on building a fence and he was trying to use a chainsaw and his shoulder was sore after that.  There was no traumatic mechanics, no "pop".He went to ER and got some antiinflammatory treatment prednisone- it was helpful; he then noticed sx returned after the prednisone, saw his PCP and was referred to PT.  The place he was referred did not take his insurance so he went back to ER in October bc of the pain.  They did a toradol shot and it was helpful.  He then went for a consult at [facility] in October.  Had x-rays (-) for fx.  He had a steroid shot at orthopedics- got some relief from that.  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 Yes   PRECAUTIONS: None  RED FLAGS: None   WEIGHT BEARING RESTRICTIONS: No  FALLS:  Has patient fallen in last 6 months? No  LIVING ENVIRONMENT: Lives with: lives with their family Lives in: House/apartment Stairs: yes, no issues Has following equipment at home: None  OCCUPATION: Not working  Currently helping with caregiving for his parents- has to physically lift and assist his Dad with transfers and mobility- he is the sore caregiver  He enjoys doing physical work at home   PLOF: Independent  PATIENT GOALS:to be able to perform his daily activities without being limited by L shoulder pain  NEXT MD  VISIT:   OBJECTIVE:  Note: Objective measures were completed at Evaluation unless otherwise noted.  DIAGNOSTIC FINDINGS:  X-rays of L shoulder- he states he was told he has some arthritis, maybe RTC tendonitis    PATIENT SURVEYS:  FOTO 43/63  COGNITION: Overall cognitive status: Within functional limits for tasks assessed     SENSATION: deferred  POSTURE: Forward head, rounded forward shoulders  UPPER EXTREMITY ROM:   Active ROM Right eval Left eval  Shoulder flexion 170 105 * PROM 145  Shoulder extension    Shoulder abduction    Shoulder adduction    Shoulder internal rotation HBB L  thumb to L2 HBB thumb to inf scap  Shoulder external rotation 85 45* PROM 65  Elbow flexion normal normal  Elbow extension normal normal  Wrist flexion    Wrist extension    Wrist ulnar deviation    Wrist radial deviation    Wrist pronation    Wrist supination    (Blank rows = not tested) *= pain UPPER EXTREMITY MMT:  MMT Right eval Left eval  Shoulder flexion 5/5 4+/5 *  Shoulder extension    Shoulder abduction  4+/5 *  Shoulder adduction    Shoulder internal rotation  4+/5 *  Shoulder external rotation  4+/5   Middle trapezius    Lower trapezius    Elbow flexion  4+/5 *  Elbow extension  4+/5  Wrist flexion    Wrist extension    Wrist ulnar deviation    Wrist radial deviation    Wrist pronation    Wrist supination    Grip strength (lbs)    (Blank rows = not tested)*= pain  SHOULDER SPECIAL TESTS: + painful arc L; + lateral jobe for pain and mild weakness L; (-) TTP proximal LH biceps tendon  JOINT MOBILITY TESTING:  Hypomobility L GH jt inf and AP glides  PALPATION:  Impaired scapulothoracic mm motor control- further assessment needed at next visit (+) MTP L infraspinatus, UT, pec maj  Cervical Spine screen: did not reproduce L shoulder pain with cervical spine AROM and  OP and (-) Spurling's R and L; deferred neural tension and full neuro screen    TODAY'S  TREATMENT:    DATE: 05/15/23 Subjective: Pt reports his shoulder feels a little better than last week.  He has been working on his HEP and focusing on strengthening.      Objective: Progress Note from 05/01/23                                                                                                                 Pain: 1-2/10 at rest upon arrival FOTO: 59/63 (was 43 at initial evaluation)  L shoulder AROM:  L flexion 150, pain with lowering (was 105* at initial eval) L abd 160   HBB to T12 ER 55 deg (was 45* at initial eval) *= pain   L shoulder strength: Flexion 4+/5 Abd 4+/5 ER 4+/5 IR 4+/5 * pt notes sensation of "instability" *= pain  Treatment today:  (+) pain with resisted L IR  (+) pain with overhead reach (+) biceps II load L (+) crank L (+) "catch"/"click" with L shoulder AROM at end ranges flexion, ER, overhead reach  Therapeutic Exercises:  UBE: 8 mins 4/4 F/B Shoulder 10x wand AAROM- not today Shoulder ER 10x wand xAAROM- not today MRE: isometrics IR/ER at  0 deg abd, and then 45 deg ab 5 second holds x 15 ea, and at 90 deg abd 5 sec holds x15 ea Rhythmic stab at 90 deg flexion supine: 30 seconds x 3 Plank on elevated table (counter height): 30 second intervals x 3 Standing rows: 50# nautilis 2x12  Seated lat pull down: 60# 2x15  Standing triceps pull down: 40# 2x15 Standing shoulder extension: BTB 2x15- not today Step out RTC isometrics: green band 5 second holds 2x6 ea direction Serratus/scapular mm activation at wall with red band: wall slide red band 8 reps, 3 sets   Manual Therapy:  15 mins- not today L shldr jt mobs- GH A/P and inferior glides Gr III/IV 30-40 sec bouts PROM in all planes MWM for flexion in sitting- clavicle/scapular assist x 10- not today Scapulothoracic mobs- all directions x 1 min, gr IV PROM flexion, abd, ER, IR, assessment of GH jt mobility  Not today MRE scapular depression and retraction S/L ER: 3 lbs 2x15 (to  fatigue) Prone row: 5 lb 2x15 (to fatigue) Prone Y: 0# 2x12 (to fatigue Serratus ant punches #3 2x12- not today Seated scapular retraction x15 with PT tactile cues- not today Scap retraction with GTB 5 second holds x 15, 2 sets   Seated thoracic extension over chair 2x10- reviewed for HEP Bicep curls GTB x 60 secs- not today Tricep PREs with GTB x 60 secs- not today W to Y on wall x 60 secs- not today HEP Lower and Mid traps activation x 60 secs each. - not today HEP MWM shoulder flexion with band post/inf glide on clavicle x10 (used blue TB in door)- showed pt how to perform this for HEP  Pt education for shoulder mechanics during shoulder  height and overhead reach activities; cue to first achieve upright posture and then "keep thumb up" with reaching  PATIENT EDUCATION: Education details: PT POC/goals/HEP Person educated: Patient Education method: Explanation Education comprehension: verbalized understanding  HOME EXERCISE PROGRAM: Access Code: [email or web address] URL: [email or web address] Date: 05/01/2023 Prepared by: Max Fickle  Exercises - Supine Shoulder Flexion Extension AAROM with Dowel  - 1 x daily - 7 x weekly - 3 sets - 10 reps - Scapular Retraction with Resistance  - 1 x daily - 7 x weekly - 3 sets - 10 reps - Standing Shoulder Shrug with Resistance  - 1 x daily - 7 x weekly - 3 sets - 10 reps - Shoulder extension with resistance - Neutral  - 1 x daily - 7 x weekly - 3 sets - 10 reps - Standing Single Arm Elbow Flexion with Resistance  - 1 x daily - 7 x weekly - 3 sets - 10 reps - Standing Shoulder Horizontal Abduction with Resistance  - 1 x daily - 7 x weekly - 3 sets - 10 reps - Low Trap Setting at Wall  - 1 x daily - 7 x weekly - 3 sets - 10 reps - Seated Thoracic Lumbar Extension  - 3 x daily - 7 x weekly - 10 reps - Serratus Activation at Wall with Foam Roll and Resistance Band  - 1 x daily - 4 x weekly - 3 sets - 8 reps - Plank with Hands on Table  -  1 x daily - 3 x weekly - 3 sets - 30 hold  ASSESSMENT:  CLINICAL IMPRESSION: Pt was challenged appropriately with shoulder mm strengthening today using the nautilus machine.  Incorporated the use of nautilus into PT tx plan as one of his goals is to continue with strengthening at the gym independently after PT. He should continue to benefit from skilled PT to address impairments listed below and to facilitate return to PLOF including being able to carry, reach, lift, and perform caregiver duties without being limited by L shoulder pain.  Likely has not met maximum benefit from skilled PT; recommend continued tx at this time.   OBJECTIVE IMPAIRMENTS: decreased activity tolerance, decreased ROM, decreased strength, hypomobility, improper body mechanics, postural dysfunction, and pain.   ACTIVITY LIMITATIONS: carrying, lifting, reach over head, and caring for others  PARTICIPATION LIMITATIONS: cleaning, interpersonal relationship, community activity, yard work, and caregiver duties for family  PERSONAL FACTORS: Past/current experiences and Time since onset of injury/illness/exacerbation are also affecting patient's functional outcome.   REHAB POTENTIAL: Good  CLINICAL DECISION MAKING: Stable/uncomplicated  EVALUATION COMPLEXITY: Low   GOALS: Goals reviewed with patient? Yes  SHORT TERM GOALS: Target date: 04/23/23  Pt will be able to perform HEP for shoulder ROM/strengthening 4x/week to facilitate optimal long term outcome with PT Baseline: initiated today Goal status: INITIAL   LONG TERM GOALS: Target date: 06/12/23  Improve shoulder strength (abd, IR) 1/2 MMT grade to promote improved ability to lift and perform his daily activities as a caregiver without being limited by L shoulder pain Baseline: 4+/5 05/01/23: 4+/5 Goal status: In progress  2.   Improve FOTO to 63 indicating pt able to perform daily activities without being limited by L shoulder pain Baseline: 43; 05/01/23:  59 Goal status: In progress  3.  Improve L shoulder AROM flexion to >150 deg to be able to perform overhead activities without being limited by L shoulder pain Baseline: AROM 105, 05/01/23 150 deg Goal status: In progress  4.  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

## 2023-05-22 ENCOUNTER — Ambulatory Visit: Payer: Medicaid Other

## 2023-05-29 ENCOUNTER — Ambulatory Visit: Payer: Medicaid Other | Attending: Orthopedic Surgery

## 2023-05-29 DIAGNOSIS — M7542 Impingement syndrome of left shoulder: Secondary | ICD-10-CM | POA: Diagnosis present

## 2023-05-29 DIAGNOSIS — M7522 Bicipital tendinitis, left shoulder: Secondary | ICD-10-CM | POA: Insufficient documentation

## 2023-05-29 DIAGNOSIS — M25512 Pain in left shoulder: Secondary | ICD-10-CM | POA: Insufficient documentation

## 2023-05-29 NOTE — Therapy (Signed)
 OUTPATIENT PHYSICAL THERAPY SHOULDER TREATMENT/PROGRESS NOTE/RE-CERTIFICATION NOTE through 06/12/23   Patient Name: Ian [name] MRN: [id number] DOB:1980-07-19, 43 y.o., male Today's Date: 05/29/2023  END OF SESSION:  PT End of Session - 05/29/23 1033     Visit Number 10    Number of Visits 13    Date for PT Re-Evaluation 06/12/23    Authorization Type PN/recert done on 05/01/23; additional 1-2x/week x 6 weeks (through 06/12/23)    Authorization Time Period requested 6 visits    PT Start Time 1030    PT Stop Time 1115    PT Time Calculation (min) 45 min    Activity Tolerance Patient tolerated treatment well    Behavior During Therapy [facility] for tasks assessed/performed              Past Medical History:  Diagnosis Date   Anxiety    Depression    Hypercholesteremia    Hypertension    Neuropathy    Panic disorder 03/24/2015   No past surgical history on file. 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   Depression, major, recurrent, in complete remission (HCC) 02/09/2015   Depression, major, recurrent, moderate (HCC) 02/09/2015    PCP: Carlin Blamer [facility]   REFERRING PROVIDER: Donnice Escort, PA-C  REFERRING DIAG: [id number] Pain in L shoulder  THERAPY DIAG:  Left shoulder pain, unspecified chronicity  Biceps tendonitis on left  Rationale for Evaluation and Treatment: Rehabilitation  ONSET DATE: Early summer 2024  SUBJECTIVE:                                                                                                                                                                                      SUBJECTIVE STATEMENT: Pt  reports his L shoulder started bothering him this summer.  He started noticing a little catch in his shoulder.  He worked on building a fence and he was trying to use a chainsaw and his shoulder was sore after that.  There was no traumatic mechanics, no pop.He went to ER and got some antiinflammatory treatment prednisone - it was helpful; he then noticed sx returned after the prednisone , saw his PCP and was referred to PT.  The place he was referred did not take his insurance so he went back to ER in October bc of the pain.  They did a toradol  shot and it was helpful.  He then went for a consult at [facility] in October.  Had x-rays (-) for fx.  He had a steroid shot at orthopedics- got some relief from that.  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 Yes   PRECAUTIONS: None  RED FLAGS: None   WEIGHT BEARING RESTRICTIONS: No  FALLS:  Has patient fallen in last 6 months? No  LIVING ENVIRONMENT: Lives with: lives with their family Lives in: House/apartment Stairs: yes, no issues Has following equipment at home: None  OCCUPATION: Not working  Currently helping with caregiving for his parents- has to physically lift and assist his Dad with transfers and mobility- he is the sore caregiver  He enjoys doing physical work at home   PLOF: Independent  PATIENT GOALS:to be able to perform his daily activities without being limited by L shoulder pain  NEXT MD  VISIT:   OBJECTIVE:  Note: Objective measures were completed at Evaluation unless otherwise noted.  DIAGNOSTIC FINDINGS:  X-rays of L shoulder- he states he was told he has some arthritis, maybe RTC tendonitis    PATIENT SURVEYS:  FOTO 43/63  COGNITION: Overall cognitive status: Within functional limits for tasks assessed     SENSATION: deferred  POSTURE: Forward head, rounded forward shoulders  UPPER EXTREMITY ROM:   Active ROM Right eval Left eval  Shoulder flexion 170 105 * PROM 145  Shoulder extension    Shoulder abduction    Shoulder adduction    Shoulder internal rotation HBB L  thumb to L2 HBB thumb to inf scap  Shoulder external rotation 85 45* PROM 65  Elbow flexion normal normal  Elbow extension normal normal  Wrist flexion    Wrist extension    Wrist ulnar deviation    Wrist radial deviation    Wrist pronation    Wrist supination    (Blank rows = not tested) *= pain UPPER EXTREMITY MMT:  MMT Right eval Left eval  Shoulder flexion 5/5 4+/5 *  Shoulder extension    Shoulder abduction  4+/5 *  Shoulder adduction    Shoulder internal rotation  4+/5 *  Shoulder external rotation  4+/5   Middle trapezius    Lower trapezius    Elbow flexion  4+/5 *  Elbow extension  4+/5  Wrist flexion    Wrist extension    Wrist ulnar deviation    Wrist radial deviation    Wrist pronation    Wrist supination    Grip strength (lbs)    (Blank rows = not tested)*= pain  SHOULDER SPECIAL TESTS: + painful arc L; + lateral jobe for pain and mild weakness L; (-) TTP proximal LH biceps tendon  JOINT MOBILITY TESTING:  Hypomobility L GH jt inf and AP glides  PALPATION:  Impaired scapulothoracic mm motor control- further assessment needed at next visit (+) MTP L infraspinatus, UT, pec maj  Cervical Spine screen: did not reproduce L shoulder pain with cervical spine AROM and  OP and (-) Spurling's R and L; deferred neural tension and full neuro screen    TODAY'S  TREATMENT:    DATE: 05/29/23 Subjective: Pt reports his shoulder is feeling overall better since his last session; taking less medication for sx management    Objective: Progress Note from 05/01/23                                                                                                                 Pain: 1-2/10 at rest upon arrival FOTO: 59/63 (was 43 at initial evaluation)  L shoulder AROM:  L flexion 150, pain with lowering (was 105* at initial eval) L abd 160   HBB to T12 ER 55 deg (was 45* at initial eval) *= pain   L shoulder strength: Flexion 4+/5 Abd 4+/5 ER 4+/5 IR 4+/5 * pt notes sensation of instability *= pain  Treatment today:  (+) pain with resisted L IR  (+) pain with overhead reach (+) biceps II load L (+) crank L (+) catch/click with L shoulder AROM at end ranges flexion, ER, overhead reach  Therapeutic Exercises:  UBE: 8 mins 4/4 F/B Scapular retraction with ER: green TB x 20 Shoulder 10x wand AAROM- not today Shoulder ER 10x wand xAAROM- not today MRE: isometrics IR/ER at  0 deg abd, and then 45 deg ab 5 second holds x 15 ea, and at 90 deg abd 5 sec holds x15 ea Rhythmic stab at 90 deg-120 deg flexion supine: 30 seconds x 3 Plank on elevated table (counter height): 30 second intervals x 3 Standing rows: 50# nautilis x12, 60# x12  Seated lat pull down: 60# 2x15 Standing triceps pull down: 40# 2x15 Standing shoulder extension: BTB 2x15- not today Step out RTC isometrics: green band 5 second holds 2x6 ea direction Serratus/scapular mm activation on foam roller at wall with red band: wall slide red band 8 reps, 3 sets   Manual Therapy:  15 mins- not today L shldr jt mobs- GH A/P and inferior glides Gr III/IV 30-40 sec bouts PROM in all planes MWM for flexion in sitting- clavicle/scapular assist x 10- not today Scapulothoracic mobs- all directions x 1 min, gr IV PROM flexion, abd, ER, IR, assessment of GH jt mobility  Not today MRE  scapular depression and retraction S/L ER: 3 lbs 2x15 (to fatigue) Prone row: 5 lb 2x15 (to fatigue) Prone Y: 0# 2x12 (to fatigue Serratus ant punches #3 2x12- not today Seated scapular retraction x15 with PT tactile cues- not today Scap retraction with GTB 5 second holds x 15, 2 sets   Seated thoracic extension over chair 2x10- reviewed for HEP Bicep curls GTB x 60 secs- not today W to Y on wall x 60 secs- not today HEP Lower and Mid traps activation x 60 secs each. - not today HEP MWM shoulder flexion with band post/inf glide on clavicle x10 (used blue TB in door)- showed pt how to perform this for HEP  Pt education for shoulder mechanics during shoulder height and overhead  reach activities; cue to first achieve upright posture and then keep thumb up with reaching  PATIENT EDUCATION: Education details: PT POC/goals/HEP Person educated: Patient Education method: Explanation Education comprehension: verbalized understanding  HOME EXERCISE PROGRAM: Access Code: [email or web address] URL: [email or web address] Date: 05/01/2023 Prepared by: Vernell Reges  Exercises - Supine Shoulder Flexion Extension AAROM with Dowel  - 1 x daily - 7 x weekly - 3 sets - 10 reps - Scapular Retraction with Resistance  - 1 x daily - 7 x weekly - 3 sets - 10 reps - Standing Shoulder Shrug with Resistance  - 1 x daily - 7 x weekly - 3 sets - 10 reps - Shoulder extension with resistance - Neutral  - 1 x daily - 7 x weekly - 3 sets - 10 reps - Standing Single Arm Elbow Flexion with Resistance  - 1 x daily - 7 x weekly - 3 sets - 10 reps - Standing Shoulder Horizontal Abduction with Resistance  - 1 x daily - 7 x weekly - 3 sets - 10 reps - Low Trap Setting at Wall  - 1 x daily - 7 x weekly - 3 sets - 10 reps - Seated Thoracic Lumbar Extension  - 3 x daily - 7 x weekly - 10 reps - Serratus Activation at Wall with Foam Roll and Resistance Band  - 1 x daily - 4 x weekly - 3 sets - 8 reps - Plank with  Hands on Table  - 1 x daily - 3 x weekly - 3 sets - 30 hold  ASSESSMENT:  CLINICAL IMPRESSION: Pt was challenged appropriately with shoulder mm strengthening today using the nautilus machine.  Progressed scapular mm retraining using foam roller on wall; and with increased resistance on nautilus.  Progressed rhythmic stabilization through further shoulder flexion ROM positions.  He should continue to benefit from skilled PT to address impairments listed below and to facilitate return to PLOF including being able to carry, reach, lift, and perform caregiver duties without being limited by L shoulder pain.  Likely has not met maximum benefit from skilled PT; recommend continued tx at this time.   OBJECTIVE IMPAIRMENTS: decreased activity tolerance, decreased ROM, decreased strength, hypomobility, improper body mechanics, postural dysfunction, and pain.   ACTIVITY LIMITATIONS: carrying, lifting, reach over head, and caring for others  PARTICIPATION LIMITATIONS: cleaning, interpersonal relationship, community activity, yard work, and caregiver duties for family  PERSONAL FACTORS: Past/current experiences and Time since onset of injury/illness/exacerbation are also affecting patient's functional outcome.   REHAB POTENTIAL: Good  CLINICAL DECISION MAKING: Stable/uncomplicated  EVALUATION COMPLEXITY: Low   GOALS: Goals reviewed with patient? Yes  SHORT TERM GOALS: Target date: 04/23/23  Pt will be able to perform HEP for shoulder ROM/strengthening 4x/week to facilitate optimal long term outcome with PT Baseline: initiated today Goal status: INITIAL   LONG TERM GOALS: Target date: 06/12/23  Improve shoulder strength (abd, IR) 1/2 MMT grade to promote improved ability to lift and perform his daily activities as a caregiver without being limited by L shoulder pain Baseline: 4+/5 05/01/23: 4+/5 Goal status: In progress  2.   Improve FOTO to 63 indicating pt able to perform daily activities  without being limited by L shoulder pain Baseline: 43; 05/01/23: 59 Goal status: In progress  3.  Improve L shoulder AROM flexion to >150 deg to be able to perform overhead activities without being limited by L shoulder pain Baseline: AROM 105, 05/01/23 150 deg Goal status: In progress  4.  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

## 2023-06-07 ENCOUNTER — Ambulatory Visit: Payer: Medicaid Other

## 2023-06-07 DIAGNOSIS — M25512 Pain in left shoulder: Secondary | ICD-10-CM | POA: Diagnosis not present

## 2023-06-07 DIAGNOSIS — M7522 Bicipital tendinitis, left shoulder: Secondary | ICD-10-CM

## 2023-06-07 NOTE — Therapy (Signed)
OUTPATIENT PHYSICAL THERAPY SHOULDER TREATMENT/PROGRESS NOTE/RE-CERTIFICATION NOTE through 06/12/23   Patient Name: Ian [name] MRN: [id number] DOB:12/27/1980, 43 y.o., male Today's Date: 06/07/2023  END OF SESSION:  PT End of Session - 06/07/23 0947     Visit Number 11    Number of Visits 13    Date for PT Re-Evaluation 06/12/23    Authorization Type PN/recert done on 05/01/23; additional 1-2x/week x 6 weeks (through 06/12/23)    Authorization Time Period requested 6 visits    PT Start Time 0945    PT Stop Time 1030    PT Time Calculation (min) 45 min    Activity Tolerance Patient tolerated treatment well    Behavior During Therapy [facility] for tasks assessed/performed              Past Medical History:  Diagnosis Date   Anxiety    Depression    Hypercholesteremia    Hypertension    Neuropathy    Panic disorder 03/24/2015   No past surgical history on file. 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   Depression, major, recurrent, in complete remission (HCC) 02/09/2015   Depression, major, recurrent, moderate (HCC) 02/09/2015    PCP: [facility]   REFERRING PROVIDER: Lanney Gins, PA-C  REFERRING DIAG: [phone] Pain in L shoulder  THERAPY DIAG:  Left shoulder pain, unspecified chronicity  Biceps tendonitis on left  Rationale for Evaluation and Treatment: Rehabilitation  ONSET DATE: Early summer 2024  SUBJECTIVE:                                                                                                                                                                                      SUBJECTIVE STATEMENT: Pt  reports his L shoulder started bothering him this summer.  He started noticing a little catch in his shoulder.  He worked on building a fence and he was trying to use a chainsaw and his shoulder was sore after that.  There was no traumatic mechanics, no "pop".He went to ER and got some antiinflammatory treatment prednisone- it was helpful; he then noticed sx returned after the prednisone, saw his PCP and was referred to PT.  The place he was referred did not take his insurance so he went back to ER in October bc of the pain.  They did a toradol shot and it was helpful.  He then went for a consult at [facility] in October.  Had x-rays (-) for fx.  He had a steroid shot at orthopedics- got some relief from that.  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 Yes   PRECAUTIONS: None  RED FLAGS: None   WEIGHT BEARING RESTRICTIONS: No  FALLS:  Has patient fallen in last 6 months? No  LIVING ENVIRONMENT: Lives with: lives with their family Lives in: House/apartment Stairs: yes, no issues Has following equipment at home: None  OCCUPATION: Not working  Currently helping with caregiving for his parents- has to physically lift and assist his Dad with transfers and mobility- he is the sore caregiver  He enjoys doing physical work at home   PLOF: Independent  PATIENT GOALS:to be able to perform his daily activities without being limited by L shoulder pain  NEXT MD  VISIT:   OBJECTIVE:  Note: Objective measures were completed at Evaluation unless otherwise noted.  DIAGNOSTIC FINDINGS:  X-rays of L shoulder- he states he was told he has some arthritis, maybe RTC tendonitis    PATIENT SURVEYS:  FOTO 43/63  COGNITION: Overall cognitive status: Within functional limits for tasks assessed     SENSATION: deferred  POSTURE: Forward head, rounded forward shoulders  UPPER EXTREMITY ROM:   Active ROM Right eval Left eval  Shoulder flexion 170 105 * PROM 145  Shoulder extension    Shoulder abduction    Shoulder adduction    Shoulder internal rotation HBB L  thumb to L2 HBB thumb to inf scap  Shoulder external rotation 85 45* PROM 65  Elbow flexion normal normal  Elbow extension normal normal  Wrist flexion    Wrist extension    Wrist ulnar deviation    Wrist radial deviation    Wrist pronation    Wrist supination    (Blank rows = not tested) *= pain UPPER EXTREMITY MMT:  MMT Right eval Left eval  Shoulder flexion 5/5 4+/5 *  Shoulder extension    Shoulder abduction  4+/5 *  Shoulder adduction    Shoulder internal rotation  4+/5 *  Shoulder external rotation  4+/5   Middle trapezius    Lower trapezius    Elbow flexion  4+/5 *  Elbow extension  4+/5  Wrist flexion    Wrist extension    Wrist ulnar deviation    Wrist radial deviation    Wrist pronation    Wrist supination    Grip strength (lbs)    (Blank rows = not tested)*= pain  SHOULDER SPECIAL TESTS: + painful arc L; + lateral jobe for pain and mild weakness L; (-) TTP proximal LH biceps tendon  JOINT MOBILITY TESTING:  Hypomobility L GH jt inf and AP glides  PALPATION:  Impaired scapulothoracic mm motor control- further assessment needed at next visit (+) MTP L infraspinatus, UT, pec maj  Cervical Spine screen: did not reproduce L shoulder pain with cervical spine AROM and  OP and (-) Spurling's R and L; deferred neural tension and full neuro screen    TODAY'S  TREATMENT:    DATE: 06/07/23 Subjective: Pt reports overall seeing some improvement in shoulder sx with PT    Objective: Progress Note from 05/01/23                                                                                                                 Pain: 1-2/10 at rest upon arrival FOTO: 59/63 (was 43 at initial evaluation)  L shoulder AROM:  L flexion 150, pain with lowering (was 105* at initial eval) L abd 160   HBB to T12 ER 55 deg (was 45* at initial eval) *= pain   L shoulder strength: Flexion 4+/5 Abd 4+/5 ER 4+/5 IR 4+/5 * pt notes sensation of "instability" *= pain  Treatment today:   Therapeutic Exercises:  UBE: 8 mins 4/4 F/B Scapular retraction with ER: green TB x 20 Shoulder 10x wand AAROM- not today Shoulder ER 10x wand xAAROM- not today MRE: isometrics IR/ER at  0 deg abd, and then 45 deg ab 5 second holds x 15 ea, and at 90 deg abd 5 sec holds x15 ea Rhythmic stab at 90 deg-120 deg flexion supine: 30 seconds x 3 Plank on elevated table (counter height): 30 second intervals x 3 Standing rows: 50# nautilis x12, 60# x12  Seated lat pull down: 60# 2x15 Standing triceps pull down: 40# 2x15 Standing shoulder extension: BTB 2x15- not today Step out RTC isometrics: green band 5 second holds 2x6 ea direction Serratus/scapular mm activation on foam roller at wall with red band: wall slide red band 8 reps, 3 sets Body blade: elbow at side, f/b 30 second intervals x 3, IR/ER 30 second intervals x3  Manual Therapy: 3 min, MWM for shoulder flexion technique 3 rounds x 10 during session followed by pt active flexion x 5 each round   Body mechanics training for caregiver duties: sit to stand transfers, focused on scapular mm activation, neutral spine, standing on L side vs R side, also discussed use of a gait belt to assist  Manual Therapy:  15 mins- not today L shldr jt mobs- GH A/P and inferior glides Gr III/IV 30-40 sec bouts PROM in all planes MWM for  flexion in sitting- clavicle/scapular assist x 10- not today Scapulothoracic mobs- all directions x 1 min, gr IV PROM flexion, abd, ER, IR, assessment of GH jt mobility  Not today MRE scapular depression and retraction S/L ER: 3 lbs 2x15 (to fatigue) Prone row: 5 lb 2x15 (to fatigue) Prone Y: 0# 2x12 (to fatigue Serratus ant punches #3 2x12- not today Seated scapular retraction x15 with PT tactile cues- not today Scap retraction with GTB 5 second holds x 15, 2 sets   Seated thoracic extension over chair 2x10- reviewed for HEP Bicep curls GTB x 60 secs- not today W to Y on wall x 60 secs- not today HEP Lower and Mid traps activation x 60 secs each. - not today  HEP MWM shoulder flexion with band post/inf glide on clavicle x10 (used blue TB in door)- showed pt how to perform this for HEP  Pt education for shoulder mechanics during shoulder height and overhead reach activities; cue to first achieve upright posture and then "keep thumb up" with reaching  PATIENT EDUCATION: Education details: PT POC/goals/HEP; Civil engineer, contracting for caregiver role Person educated: Patient Education method: Explanation Education comprehension: verbalized understanding  HOME EXERCISE PROGRAM: Access Code: [email or web address] URL: [email or web address] Date: 05/01/2023 Prepared by: Max Fickle  Exercises - Supine Shoulder Flexion Extension AAROM with Dowel  - 1 x daily - 7 x weekly - 3 sets - 10 reps - Scapular Retraction with Resistance  - 1 x daily - 7 x weekly - 3 sets - 10 reps - Standing Shoulder Shrug with Resistance  - 1 x daily - 7 x weekly - 3 sets - 10 reps - Shoulder extension with resistance - Neutral  - 1 x daily - 7 x weekly - 3 sets - 10 reps - Standing Single Arm Elbow Flexion with Resistance  - 1 x daily - 7 x weekly - 3 sets - 10 reps - Standing Shoulder Horizontal Abduction with Resistance  - 1 x daily - 7 x weekly - 3 sets - 10 reps - Low Trap Setting at Wall  - 1 x  daily - 7 x weekly - 3 sets - 10 reps - Seated Thoracic Lumbar Extension  - 3 x daily - 7 x weekly - 10 reps - Serratus Activation at Wall with Foam Roll and Resistance Band  - 1 x daily - 4 x weekly - 3 sets - 8 reps - Plank with Hands on Table  - 1 x daily - 3 x weekly - 3 sets - 30 hold  ASSESSMENT:  CLINICAL IMPRESSION: Pt responded well to MWM technique for shoulder flexion; improved ability to perform AROM through available ROM with less discomfort noted after technique.  Tolerated strengthening exercises well.  Spent time focused today on body mechanics training for upper quarter for caregiver role with family member to promote improved ability to assist with sit to stand transfers without further straining his own L shoulder.  He should continue to benefit from skilled PT to address impairments listed below and to facilitate return to PLOF including being able to carry, reach, lift, and perform caregiver duties without being limited by L shoulder pain.  Likely has not met maximum benefit from skilled PT; recommend continued tx at this time.   OBJECTIVE IMPAIRMENTS: decreased activity tolerance, decreased ROM, decreased strength, hypomobility, improper body mechanics, postural dysfunction, and pain.   ACTIVITY LIMITATIONS: carrying, lifting, reach over head, and caring for others  PARTICIPATION LIMITATIONS: cleaning, interpersonal relationship, community activity, yard work, and caregiver duties for family  PERSONAL FACTORS: Past/current experiences and Time since onset of injury/illness/exacerbation are also affecting patient's functional outcome.   REHAB POTENTIAL: Good  CLINICAL DECISION MAKING: Stable/uncomplicated  EVALUATION COMPLEXITY: Low   GOALS: Goals reviewed with patient? Yes  SHORT TERM GOALS: Target date: 04/23/23  Pt will be able to perform HEP for shoulder ROM/strengthening 4x/week to facilitate optimal long term outcome with PT Baseline: initiated today Goal  status: INITIAL   LONG TERM GOALS: Target date: 06/12/23  Improve shoulder strength (abd, IR) 1/2 MMT grade to promote improved ability to lift and perform his daily activities as a caregiver without being limited by L shoulder pain Baseline: 4+/5 05/01/23: 4+/5 Goal status: In progress  2.  Improve FOTO to 63 indicating pt able to perform daily activities without being limited by L shoulder pain Baseline: 43; 05/01/23: 59 Goal status: In progress  3.  Improve L shoulder AROM flexion to >150 deg to be able to perform overhead activities without being limited by L shoulder pain Baseline: AROM 105, 05/01/23 150 deg Goal status: In progress  4.  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

## 2023-06-12 ENCOUNTER — Ambulatory Visit: Payer: Medicaid Other

## 2023-06-12 DIAGNOSIS — M7522 Bicipital tendinitis, left shoulder: Secondary | ICD-10-CM

## 2023-06-12 DIAGNOSIS — M25512 Pain in left shoulder: Secondary | ICD-10-CM

## 2023-06-12 NOTE — Therapy (Signed)
OUTPATIENT PHYSICAL THERAPY SHOULDER TREATMENT/PROGRESS NOTE/RE-CERTIFICATION NOTE through 07/10/23   Patient Name: Ian [name] MRN: [id number] DOB:June 24, 1980, 43 y.o., male Today's Date: 06/12/2023  END OF SESSION:  PT End of Session - 06/12/23 1121     Visit Number 12    Number of Visits 19    Date for PT Re-Evaluation 07/10/23    Authorization Type PN/recert done on 06/12/23; additional 1-2x/week x 64 weeks (through 07/10/23)    Authorization Time Period requested 6 visits    PT Start Time 1115    PT Stop Time 1200    PT Time Calculation (min) 45 min    Activity Tolerance Patient tolerated treatment well    Behavior During Therapy [facility] for tasks assessed/performed              Past Medical History:  Diagnosis Date   Anxiety    Depression    Hypercholesteremia    Hypertension    Neuropathy    Panic disorder 03/24/2015   No past surgical history on file. 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   Depression, major, recurrent, in complete remission (HCC) 02/09/2015   Depression, major, recurrent, moderate (HCC) 02/09/2015    PCP: [facility] [place]   REFERRING PROVIDER: Lanney Gins, PA-C  REFERRING DIAG: [phone] Pain in L shoulder  THERAPY DIAG:  Left shoulder pain, unspecified chronicity  Biceps tendonitis on left  Rationale for Evaluation and Treatment: Rehabilitation  ONSET DATE: Early summer 2024  SUBJECTIVE:                                                                                                                                                                                      SUBJECTIVE STATEMENT: Pt  reports his L shoulder started bothering him this summer.  He started noticing a little catch in his shoulder.  He worked on building a fence and he was trying to use a chainsaw and his shoulder was sore after that.  There was no traumatic mechanics, no "pop".He went to ER and got some antiinflammatory treatment prednisone- it was helpful; he then noticed sx returned after the prednisone, saw his PCP and was referred to PT.  The place he was referred did not take his insurance so he went back to ER in October bc of the pain.  They did a toradol shot and it was helpful.  He then went for a consult at [facility] in October.  Had x-rays (-) for fx.  He had a steroid shot at orthopedics- got some relief from that.  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 Yes   PRECAUTIONS: None  RED FLAGS: None   WEIGHT BEARING RESTRICTIONS: No  FALLS:  Has patient fallen in last 6 months? No  LIVING ENVIRONMENT: Lives with: lives with their family Lives in: House/apartment Stairs: yes, no issues Has following equipment at home: None  OCCUPATION: Not working  Currently helping with caregiving for his parents- has to physically lift and assist his Dad with transfers and mobility- he is the sore caregiver  He enjoys doing physical work at home   PLOF: Independent  PATIENT GOALS:to be able to perform his daily activities without being limited by L shoulder pain  NEXT MD  VISIT:   OBJECTIVE:  Note: Objective measures were completed at Evaluation unless otherwise noted.  DIAGNOSTIC FINDINGS:  X-rays of L shoulder- he states he was told he has some arthritis, maybe RTC tendonitis    PATIENT SURVEYS:  FOTO 43/63  COGNITION: Overall cognitive status: Within functional limits for tasks assessed     SENSATION: deferred  POSTURE: Forward head, rounded forward shoulders  UPPER EXTREMITY ROM:   Active ROM Right eval Left eval  Shoulder flexion 170 105 * PROM 145  Shoulder extension    Shoulder abduction    Shoulder adduction    Shoulder internal rotation HBB L  thumb to L2 HBB thumb to inf scap  Shoulder external rotation 85 45* PROM 65  Elbow flexion normal normal  Elbow extension normal normal  Wrist flexion    Wrist extension    Wrist ulnar deviation    Wrist radial deviation    Wrist pronation    Wrist supination    (Blank rows = not tested) *= pain UPPER EXTREMITY MMT:  MMT Right eval Left eval  Shoulder flexion 5/5 4+/5 *  Shoulder extension    Shoulder abduction  4+/5 *  Shoulder adduction    Shoulder internal rotation  4+/5 *  Shoulder external rotation  4+/5   Middle trapezius    Lower trapezius    Elbow flexion  4+/5 *  Elbow extension  4+/5  Wrist flexion    Wrist extension    Wrist ulnar deviation    Wrist radial deviation    Wrist pronation    Wrist supination    Grip strength (lbs)    (Blank rows = not tested)*= pain  SHOULDER SPECIAL TESTS: + painful arc L; + lateral jobe for pain and mild weakness L; (-) TTP proximal LH biceps tendon  JOINT MOBILITY TESTING:  Hypomobility L GH jt inf and AP glides  PALPATION:  Impaired scapulothoracic mm motor control- further assessment needed at next visit (+) MTP L infraspinatus, UT, pec maj  Cervical Spine screen: did not reproduce L shoulder pain with cervical spine AROM and  OP and (-) Spurling's R and L; deferred neural tension and full neuro screen    TODAY'S  TREATMENT:    DATE: 06/12/23 Subjective: Pt reports overall seeing some improvement in shoulder sx with PT; he tried some of the new body mechanics for caregiver duties with his family member at home and it feels like this was helpful    Objective: Progress Note from 05/01/23                                                                                                                 Pain: 1-2/10 at rest upon arrival FOTO: 59/63 (was 43 at initial evaluation)  L shoulder AROM:  L flexion 150, pain with lowering (was 105* at initial eval) L abd 160   HBB to T12 ER 55 deg (was 45* at initial eval) *= pain   L shoulder strength: Flexion 4+/5 Abd 4+/5 ER 4+/5 IR 4+/5 * pt notes sensation of "instability" *= pain  Treatment today:  L Flexion 175 (pain 150 and above) L abd 180 (pain 170 and above) HBB L T9 ER 67 deg with elbow at neutral and also shoulder at 90 deg abd  MMT: IR (at elbow neutral) 4+/5, (at 90 deg abd) 4-/5 ER (at 90 deg abd) 4-/5 Flex 4+/5 Abd-4+/5  Therapeutic Exercises:  UBE: 8 mins 4/4 F/B Scapular retraction with ER: green TB x 20 Shoulder 10x wand AAROM- not today Shoulder ER 10x wand xAAROM- not today MRE: isometrics IR/ER at  0 deg abd, and then 45 deg ab 5 second holds x 15 ea, and at 90 deg abd 5 sec holds x15 ea Rhythmic stab at 90 deg-120 deg flexion supine: 30 seconds x 3 Plank on elevated table (counter height): 30 second intervals x 3 Standing rows: 50# nautilis x12, 60# x12  Seated lat pull down: 60# 2x15 Standing triceps pull down: 40# 2x15 Standing shoulder extension: BTB 2x15- not today Step out RTC isometrics: green band 5 second holds 2x6 ea direction Serratus/scapular mm activation on foam roller at wall with red band: wall slide red band 8 reps, 3 sets Body blade: elbow at side, f/b 30 second intervals x 3, IR/ER 30 second intervals x3  Manual Therapy: 3 min, MWM for shoulder flexion technique 3 rounds x 10 during session followed  by pt active flexion x 5 each round   Body mechanics training for caregiver duties: sit to stand transfers, focused on scapular mm activation, neutral spine, standing on L side vs R side, also discussed use of a gait belt to assist  Manual Therapy:  15 mins- not today L shldr jt mobs- GH A/P and inferior glides Gr III/IV 30-40 sec bouts PROM in all planes MWM for flexion in sitting- clavicle/scapular assist x 10- not today Scapulothoracic mobs- all directions x 1 min, gr IV PROM flexion, abd, ER, IR, assessment of GH jt mobility  Not today MRE scapular depression and retraction S/L ER: 3 lbs 2x15 (to fatigue) Prone row: 5 lb 2x15 (to fatigue)  Prone Y: 0# 2x12 (to fatigue Serratus ant punches #3 2x12- not today Seated scapular retraction x15 with PT tactile cues- not today Scap retraction with GTB 5 second holds x 15, 2 sets   Seated thoracic extension over chair 2x10- reviewed for HEP Bicep curls GTB x 60 secs- not today W to Y on wall x 60 secs- not today HEP Lower and Mid traps activation x 60 secs each. - not today HEP MWM shoulder flexion with band post/inf glide on clavicle x10 (used blue TB in door)- showed pt how to perform this for HEP  Pt education for shoulder mechanics during shoulder height and overhead reach activities; cue to first achieve upright posture and then "keep thumb up" with reaching  PATIENT EDUCATION: Education details: PT POC/goals/HEP; Civil engineer, contracting for caregiver role Person educated: Patient Education method: Explanation Education comprehension: verbalized understanding  HOME EXERCISE PROGRAM: Access Code: [email or web address] URL: [email or web address] Date: 05/01/2023 Prepared by: Max Fickle  Exercises - Supine Shoulder Flexion Extension AAROM with Dowel  - 1 x daily - 7 x weekly - 3 sets - 10 reps - Scapular Retraction with Resistance  - 1 x daily - 7 x weekly - 3 sets - 10 reps - Standing Shoulder Shrug with Resistance  - 1  x daily - 7 x weekly - 3 sets - 10 reps - Shoulder extension with resistance - Neutral  - 1 x daily - 7 x weekly - 3 sets - 10 reps - Standing Single Arm Elbow Flexion with Resistance  - 1 x daily - 7 x weekly - 3 sets - 10 reps - Standing Shoulder Horizontal Abduction with Resistance  - 1 x daily - 7 x weekly - 3 sets - 10 reps - Low Trap Setting at Wall  - 1 x daily - 7 x weekly - 3 sets - 10 reps - Seated Thoracic Lumbar Extension  - 3 x daily - 7 x weekly - 10 reps - Serratus Activation at Wall with Foam Roll and Resistance Band  - 1 x daily - 4 x weekly - 3 sets - 8 reps - Plank with Hands on Table  - 1 x daily - 3 x weekly - 3 sets - 30 hold  ASSESSMENT:  CLINICAL IMPRESSION: Pt is making progress towards outpatient physical therapy goals for his L shoulder.  Overall ROM has improved; he lacks normal strength/power in L shoulder to perform sustained UE activities at and above shoulder height.  Recommend continuing PT to further improve strength  to facilitate return to PLOF including being able to carry, reach, lift, and perform caregiver duties without being limited by L shoulder pain.  Likely has not met maximum benefit from skilled PT; recommend continued tx at this time.   OBJECTIVE IMPAIRMENTS: decreased activity tolerance, decreased ROM, decreased strength, hypomobility, improper body mechanics, postural dysfunction, and pain.   ACTIVITY LIMITATIONS: carrying, lifting, reach over head, and caring for others  PARTICIPATION LIMITATIONS: cleaning, interpersonal relationship, community activity, yard work, and caregiver duties for family  PERSONAL FACTORS: Past/current experiences and Time since onset of injury/illness/exacerbation are also affecting patient's functional outcome.   REHAB POTENTIAL: Good  CLINICAL DECISION MAKING: Stable/uncomplicated  EVALUATION COMPLEXITY: Low   GOALS: Goals reviewed with patient? Yes  SHORT TERM GOALS: Target date: 04/23/23  Pt will be able  to perform HEP for shoulder ROM/strengthening 4x/week to facilitate optimal long term outcome with PT Baseline: initiated today; 06/12/23: pt reports working on HEP daily Goal status: MET  LONG TERM GOALS: Target date: 06/12/23  Improve shoulder strength (abd, IR) 1/2 MMT grade to promote improved ability to lift and perform his daily activities as a caregiver without being limited by L shoulder pain Baseline: 4+/5 05/01/23: 4+/5; 06/12/23: 4+/5 Goal status: In progress  2.   Improve FOTO to 63 indicating pt able to perform daily activities without being limited by L shoulder pain Baseline: 43; 05/01/23: 59;  Goal status: In progress  3.  Improve L shoulder AROM flexion to >150 deg to be able to perform overhead activities without being limited by L shoulder pain Baseline: AROM 105, 05/01/23 150 deg; 06/12/23: 175 deg Goal status:met  4.  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

## 2023-06-19 ENCOUNTER — Ambulatory Visit: Payer: Medicaid Other

## 2023-06-19 DIAGNOSIS — M7522 Bicipital tendinitis, left shoulder: Secondary | ICD-10-CM

## 2023-06-19 DIAGNOSIS — M7542 Impingement syndrome of left shoulder: Secondary | ICD-10-CM

## 2023-06-19 DIAGNOSIS — M25512 Pain in left shoulder: Secondary | ICD-10-CM | POA: Diagnosis not present

## 2023-06-19 NOTE — Therapy (Signed)
OUTPATIENT PHYSICAL THERAPY SHOULDER TREATMENT/PROGRESS NOTE/RE-CERTIFICATION NOTE through 07/10/23   Patient Name: Ian [name] MRN: [id number] DOB:1980-11-23, 43 y.o., male Today's Date: 06/19/2023  END OF SESSION:  PT End of Session - 06/19/23 1028     Visit Number 13    Number of Visits 19    Date for PT Re-Evaluation 07/10/23    Authorization Type PN/recert done on 06/12/23; additional 1-2x/week x 64 weeks (through 07/10/23)    Authorization Time Period requested 6 visits    PT Start Time 1030    PT Stop Time 1115    PT Time Calculation (min) 45 min    Activity Tolerance Patient tolerated treatment well    Behavior During Therapy [facility] for tasks assessed/performed              Past Medical History:  Diagnosis Date   Anxiety    Depression    Hypercholesteremia    Hypertension    Neuropathy    Panic disorder 03/24/2015   No past surgical history on file. 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   Depression, major, recurrent, in complete remission (HCC) 02/09/2015   Depression, major, recurrent, moderate (HCC) 02/09/2015    PCP: [facility]   REFERRING PROVIDER: Lanney Gins, PA-C  REFERRING DIAG: [phone] Pain in L shoulder  THERAPY DIAG:  Left shoulder pain, unspecified chronicity  Biceps tendonitis on left  Impingement syndrome of left shoulder  Rationale for Evaluation and Treatment: Rehabilitation  ONSET DATE: Early summer 2024  SUBJECTIVE:                                                                                                                                                                                       SUBJECTIVE STATEMENT: Pt reports his L shoulder started bothering him this summer.  He started noticing a little catch in his shoulder.  He worked on building a fence and he was trying to use a chainsaw and his shoulder was sore after that.  There was no traumatic mechanics, no "pop".He went to ER and got some antiinflammatory treatment prednisone- it was helpful; he then noticed sx returned after the prednisone, saw his PCP and was referred to PT.  The place he was referred did not take his insurance so he went back to ER in October bc of the pain.  They did a toradol  shot and it was helpful.  He then went for a consult at [facility] in October.  Had x-rays (-) for fx.  He had a steroid shot at orthopedics- got some relief from that.  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 Yes   PRECAUTIONS: None  RED FLAGS: None   WEIGHT BEARING RESTRICTIONS: No  FALLS:  Has patient fallen in last 6 months? No  LIVING ENVIRONMENT: Lives with: lives with their family Lives in: House/apartment Stairs: yes, no issues Has following equipment at home: None  OCCUPATION: Not working  Currently helping with caregiving for his parents- has to physically lift and assist his Dad with transfers and mobility- he is the sore caregiver  He enjoys doing physical work at home   PLOF: Independent  PATIENT GOALS:to be able to perform his daily activities without being  limited by L shoulder pain  NEXT MD VISIT:   OBJECTIVE:  Note: Objective measures were completed at Evaluation unless otherwise noted.  DIAGNOSTIC FINDINGS:  X-rays of L shoulder- he states he was told he has some arthritis, maybe RTC tendonitis    PATIENT SURVEYS:  FOTO 43/63  COGNITION: Overall cognitive status: Within functional limits for tasks assessed     SENSATION: deferred  POSTURE: Forward head, rounded forward shoulders  UPPER EXTREMITY ROM:   Active ROM Right eval Left eval  Shoulder flexion 170 105 * PROM 145  Shoulder extension    Shoulder abduction    Shoulder adduction    Shoulder internal rotation HBB L  thumb to L2 HBB thumb to inf scap  Shoulder external rotation 85 45* PROM 65  Elbow flexion normal normal  Elbow extension normal normal  Wrist flexion    Wrist extension    Wrist ulnar deviation    Wrist radial deviation    Wrist pronation    Wrist supination    (Blank rows = not tested) *= pain UPPER EXTREMITY MMT:  MMT Right eval Left eval  Shoulder flexion 5/5 4+/5 *  Shoulder extension    Shoulder abduction  4+/5 *  Shoulder adduction    Shoulder internal rotation  4+/5 *  Shoulder external rotation  4+/5   Middle trapezius    Lower trapezius    Elbow flexion  4+/5 *  Elbow extension  4+/5  Wrist flexion    Wrist extension    Wrist ulnar deviation    Wrist radial deviation    Wrist pronation    Wrist supination    Grip strength (lbs)    (Blank rows = not tested)*= pain  SHOULDER SPECIAL TESTS: + painful arc L; + lateral jobe for pain and mild weakness L; (-) TTP proximal LH biceps tendon  JOINT MOBILITY TESTING:  Hypomobility L GH jt inf and AP glides  PALPATION:  Impaired scapulothoracic mm motor control- further assessment needed at next visit (+) MTP L infraspinatus, UT, pec maj  Cervical Spine screen: did not reproduce L shoulder  pain with cervical spine AROM and OP and (-) Spurling's R and L; deferred neural  tension and full neuro screen    TODAY'S TREATMENT:    DATE: 06/19/23 Subjective: Pt reports overall his shoulder is feeling better with PT; today he has a slight migraine upon arrival- he reports he gets them occasionally and took medication before session today; no new shoulder complaints upon arrival    Objective: Progress Note from 05/01/23                                                                                                                 Pain: 1-2/10 at rest upon arrival FOTO: 59/63 (was 43 at initial evaluation)  L shoulder AROM:  L flexion 150, pain with lowering (was 105* at initial eval) L abd 160   HBB to T12 ER 55 deg (was 45* at initial eval) *= pain   L shoulder strength: Flexion 4+/5 Abd 4+/5 ER 4+/5 IR 4+/5 * pt notes sensation of "instability" *= pain  Treatment today:  L Flexion 175 (pain 150 and above) L abd 180 (pain 170 and above) HBB L T9 ER 67 deg with elbow at neutral and also shoulder at 90 deg abd  MMT: IR (at elbow neutral) 4+/5, (at 90 deg abd) 4-/5 ER (at 90 deg abd) 4-/5 Flex 4+/5 Abd-4+/5  Therapeutic Exercises:  UBE: 8 mins 4/4 F/B Scapular retraction with ER: green TB x 20 Shoulder 10x wand AAROM- not today Shoulder ER 10x wand xAAROM- not today MRE: isometrics IR/ER at  0 deg abd, and then 45 deg ab 5 second holds x 15 ea, and at 90 deg abd 5 sec holds x15 ea Rhythmic stab at 90 deg-120 deg flexion supine: 30 seconds x 3 Plank on elevated table (counter height): 30 second intervals x 3 Standing rows: 50# nautilis x12, 60# x12  Seated lat pull down: 60# 2x15 Standing triceps pull down: 40# 2x15 Standing shoulder extension: BTB 2x15 Standing rows: BTB 2x15 Step out RTC isometrics: green band 5 second holds 2x12 ea direction Serratus/scapular mm activation on foam roller at wall with red band: wall slide red band 8 reps, 3 sets Body blade: elbow at side, f/b 30 second intervals x 3, IR/ER 30 second intervals x3  Manual  Therapy: 3 min, MWM for shoulder flexion technique 3 rounds x 10 during session followed by pt active flexion x 5 each round- not today  Body mechanics training for caregiver duties: sit to stand transfers, focused on scapular mm activation, neutral spine, standing on L side vs R side, also discussed use of a gait belt to assist  Manual Therapy:  15 mins- not today L shldr jt mobs- GH A/P and inferior glides Gr III/IV 30-40 sec bouts PROM in all planes MWM for flexion in sitting- clavicle/scapular assist x 10- not today Scapulothoracic mobs- all directions x 1 min, gr IV PROM flexion, abd, ER, IR, assessment of GH jt mobility  Not today MRE scapular depression and retraction S/L ER: 3 lbs  2x15 (to fatigue) Prone row: 5 lb 2x15 (to fatigue) Prone Y: 0# 2x12 (to fatigue Serratus ant punches #3 2x12- not today Seated scapular retraction x15 with PT tactile cues- not today Scap retraction with GTB 5 second holds x 15, 2 sets   Seated thoracic extension over chair 2x10- reviewed for HEP Bicep curls GTB x 60 secs- not today W to Y on wall x 60 secs- not today HEP Lower and Mid traps activation x 60 secs each. - not today HEP MWM shoulder flexion with band post/inf glide on clavicle x10 (used blue TB in door)- showed pt how to perform this for HEP  Pt education for shoulder mechanics during shoulder height and overhead reach activities; cue to first achieve upright posture and then "keep thumb up" with reaching  PATIENT EDUCATION: Education details: PT POC/goals/HEP; Civil engineer, contracting for caregiver role Person educated: Patient Education method: Explanation Education comprehension: verbalized understanding  HOME EXERCISE PROGRAM: Access Code: [email or web address] URL: [email or web address] Date: 05/01/2023 Prepared by: Max Fickle  Exercises - Supine Shoulder Flexion Extension AAROM with Dowel  - 1 x daily - 7 x weekly - 3 sets - 10 reps - Scapular Retraction with  Resistance  - 1 x daily - 7 x weekly - 3 sets - 10 reps - Standing Shoulder Shrug with Resistance  - 1 x daily - 7 x weekly - 3 sets - 10 reps - Shoulder extension with resistance - Neutral  - 1 x daily - 7 x weekly - 3 sets - 10 reps - Standing Single Arm Elbow Flexion with Resistance  - 1 x daily - 7 x weekly - 3 sets - 10 reps - Standing Shoulder Horizontal Abduction with Resistance  - 1 x daily - 7 x weekly - 3 sets - 10 reps - Low Trap Setting at Wall  - 1 x daily - 7 x weekly - 3 sets - 10 reps - Seated Thoracic Lumbar Extension  - 3 x daily - 7 x weekly - 10 reps - Serratus Activation at Wall with Foam Roll and Resistance Band  - 1 x daily - 4 x weekly - 3 sets - 8 reps - Plank with Hands on Table  - 1 x daily - 3 x weekly - 3 sets - 30 hold  ASSESSMENT:  CLINICAL IMPRESSION: Pt is making progress towards outpatient physical therapy goals for his L shoulder.  Did not progress intensity as pt was not feeling well due to migraine during session today. Overall ROM has improved; he lacks normal strength/power in L shoulder to perform sustained UE activities at and above shoulder height.  Recommend continuing PT to further improve strength  to facilitate return to PLOF including being able to carry, reach, lift, and perform caregiver duties without being limited by L shoulder pain.  Likely has not met maximum benefit from skilled PT; recommend continued tx at this time.   OBJECTIVE IMPAIRMENTS: decreased activity tolerance, decreased ROM, decreased strength, hypomobility, improper body mechanics, postural dysfunction, and pain.   ACTIVITY LIMITATIONS: carrying, lifting, reach over head, and caring for others  PARTICIPATION LIMITATIONS: cleaning, interpersonal relationship, community activity, yard work, and caregiver duties for family  PERSONAL FACTORS: Past/current experiences and Time since onset of injury/illness/exacerbation are also affecting patient's functional outcome.   REHAB  POTENTIAL: Good  CLINICAL DECISION MAKING: Stable/uncomplicated  EVALUATION COMPLEXITY: Low   GOALS: Goals reviewed with patient? Yes  SHORT TERM GOALS: Target date: 04/23/23  Pt will be able to perform HEP for  shoulder ROM/strengthening 4x/week to facilitate optimal long term outcome with PT Baseline: initiated today; 06/12/23: pt reports working on HEP daily Goal status: MET   LONG TERM GOALS: Target date: 06/12/23  Improve shoulder strength (abd, IR) 1/2 MMT grade to promote improved ability to lift and perform his daily activities as a caregiver without being limited by L shoulder pain Baseline: 4+/5 05/01/23: 4+/5; 06/12/23: 4+/5 Goal status: In progress  2.   Improve FOTO to 63 indicating pt able to perform daily activities without being limited by L shoulder pain Baseline: 43; 05/01/23: 59;  Goal status: In progress  3.  Improve L shoulder AROM flexion to >150 deg to be able to perform overhead activities without being limited by L shoulder pain Baseline: AROM 105, 05/01/23 150 deg; 06/12/23: 175 deg Goal status:met  4.  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

## 2023-06-28 ENCOUNTER — Ambulatory Visit: Payer: Medicaid Other

## 2023-07-05 ENCOUNTER — Ambulatory Visit: Payer: Medicaid Other | Attending: Orthopedic Surgery

## 2023-07-05 DIAGNOSIS — M7542 Impingement syndrome of left shoulder: Secondary | ICD-10-CM | POA: Insufficient documentation

## 2023-07-05 DIAGNOSIS — M25512 Pain in left shoulder: Secondary | ICD-10-CM | POA: Diagnosis present

## 2023-07-05 DIAGNOSIS — M7522 Bicipital tendinitis, left shoulder: Secondary | ICD-10-CM | POA: Insufficient documentation

## 2023-07-05 NOTE — Therapy (Signed)
 OUTPATIENT PHYSICAL THERAPY SHOULDER TREATMENT/PROGRESS NOTE/RE-CERTIFICATION NOTE through 07/10/23   Patient Name: Ian [name] MRN: [id number] DOB:[birth year], 43 y.o., male Today's Date: 07/05/2023  END OF SESSION:  PT End of Session - 07/05/23 1158     Visit Number 14    Number of Visits 19    Date for PT Re-Evaluation 07/10/23    Authorization Type PN/recert done on 06/12/23; additional 1-2x/week x 64 weeks (through 07/10/23)    Authorization Time Period requested 6 visits    PT Start Time 1200    PT Stop Time 1245    PT Time Calculation (min) 45 min    Activity Tolerance Patient tolerated treatment well    Behavior During Therapy [facility] for tasks assessed/performed              Past Medical History:  Diagnosis Date   Anxiety    Depression    Hypercholesteremia    Hypertension    Neuropathy    Panic disorder 03/24/2015   No past surgical history on file. 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   Depression, major, recurrent, in complete remission (HCC) 02/09/2015   Depression, major, recurrent, moderate (HCC) 02/09/2015    PCP: [facility]   REFERRING PROVIDER: Lanney Gins, PA-C  REFERRING DIAG: [phone] Pain in L shoulder  THERAPY DIAG:  Left shoulder pain, unspecified chronicity  Biceps tendonitis on left  Rationale for Evaluation and Treatment: Rehabilitation  ONSET DATE: Early summer 2024  SUBJECTIVE:                                                                                                                                                                                      SUBJECTIVE STATEMENT: Pt  reports his L shoulder started bothering him this summer.  He started noticing a little catch in his shoulder.  He worked on building a fence and he was trying to use a chainsaw and his shoulder was sore after that.  There was no traumatic mechanics, no "pop".He went to ER and got some antiinflammatory treatment prednisone- it was helpful; he then noticed sx returned after the prednisone, saw his PCP and was referred to PT.  The place he was referred did not take his insurance so he went back to ER in October bc of the pain.  They did a toradol shot and it was helpful.  He then went for a consult at [facility] in October.  Had x-rays (-) for fx.  He had a steroid shot at orthopedics- got some relief from that.  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 Yes   PRECAUTIONS: None  RED FLAGS: None   WEIGHT BEARING RESTRICTIONS: No  FALLS:  Has patient fallen in last 6 months? No  LIVING ENVIRONMENT: Lives with: lives with their family Lives in: House/apartment Stairs: yes, no issues Has following equipment at home: None  OCCUPATION: Not working  Currently helping with caregiving for his parents- has to physically lift and assist his Dad with transfers and mobility- he is the sore caregiver  He enjoys doing physical work at home   PLOF: Independent  PATIENT GOALS:to be able to perform his daily activities without being limited by L shoulder pain  NEXT MD  VISIT:   OBJECTIVE:  Note: Objective measures were completed at Evaluation unless otherwise noted.  DIAGNOSTIC FINDINGS:  X-rays of L shoulder- he states he was told he has some arthritis, maybe RTC tendonitis    PATIENT SURVEYS:  FOTO 43/63  COGNITION: Overall cognitive status: Within functional limits for tasks assessed     SENSATION: deferred  POSTURE: Forward head, rounded forward shoulders  UPPER EXTREMITY ROM:   Active ROM Right eval Left eval  Shoulder flexion 170 105 * PROM 145  Shoulder extension    Shoulder abduction    Shoulder adduction    Shoulder internal rotation HBB L  thumb to L2 HBB thumb to inf scap  Shoulder external rotation 85 45* PROM 65  Elbow flexion normal normal  Elbow extension normal normal  Wrist flexion    Wrist extension    Wrist ulnar deviation    Wrist radial deviation    Wrist pronation    Wrist supination    (Blank rows = not tested) *= pain UPPER EXTREMITY MMT:  MMT Right eval Left eval  Shoulder flexion 5/5 4+/5 *  Shoulder extension    Shoulder abduction  4+/5 *  Shoulder adduction    Shoulder internal rotation  4+/5 *  Shoulder external rotation  4+/5   Middle trapezius    Lower trapezius    Elbow flexion  4+/5 *  Elbow extension  4+/5  Wrist flexion    Wrist extension    Wrist ulnar deviation    Wrist radial deviation    Wrist pronation    Wrist supination    Grip strength (lbs)    (Blank rows = not tested)*= pain  SHOULDER SPECIAL TESTS: + painful arc L; + lateral jobe for pain and mild weakness L; (-) TTP proximal LH biceps tendon  JOINT MOBILITY TESTING:  Hypomobility L GH jt inf and AP glides  PALPATION:  Impaired scapulothoracic mm motor control- further assessment needed at next visit (+) MTP L infraspinatus, UT, pec maj  Cervical Spine screen: did not reproduce L shoulder pain with cervical spine AROM and  OP and (-) Spurling's R and L; deferred neural tension and full neuro screen    TODAY'S  TREATMENT:    DATE: 06/04/23 Subjective: Pt reports he had some schedule conflicts the past few weeks; he has continued to work on his HEP; some days feel easier than others with raising his arm up; "everything else is feeling better"    Objective: Progress Note from 05/01/23                                                                                                                 Pain: 1-2/10 at rest upon arrival FOTO: 59/63 (was 43 at initial evaluation)  L shoulder AROM:  L flexion 150, pain with lowering (was 105* at initial eval) L abd 160   HBB to T12 ER 55 deg (was 45* at initial eval) *= pain   L shoulder strength: Flexion 4+/5 Abd 4+/5 ER 4+/5 IR 4+/5 * pt notes sensation of "instability" *= pain  Treatment today:  L Flexion 175 (pain 150 and above) L abd 180 (pain 170 and above) HBB L T9 ER 67 deg with elbow at neutral and also shoulder at 90 deg abd  MMT: IR (at elbow neutral) 4+/5, (at 90 deg abd) 4-/5 ER (at 90 deg abd) 4-/5 Flex 4+/5 Abd-4+/5  Therapeutic Exercises:  UBE: 8 mins 4/4 F/B Scapular retraction with ER: green TB x 20- not today Shoulder 10x wand AAROM- not today Shoulder ER 10x wand xAAROM- not today MRE: isometrics IR/ER at  0 deg abd, and then 45 deg ab 5 second holds x 15 ea, and at 90 deg abd 5 sec holds x15 ea Rhythmic stab at 90 deg-120 deg flexion supine: 30 seconds x 3 Plank on elevated table (counter height): 30 second intervals x 3 Standing rows: 55# 2x12  Seated lat pull down: 65# 2x15 Standing triceps pull down: 45# 2x15 Chest press: 2x15- not today Standing shoulder extension: BTB 2x15 Standing rows: BTB 2x15 Step out RTC nautilus: 25# 2x12 ea direction IR/ER Serratus/scapular mm activation on foam roller at wall with red band: wall slide red band 8 reps, 3 sets- not today Body blade: elbow at side, f/b 30 second intervals x 3, IR/ER 30 second intervals x3- not today  Manual Therapy: 3 min, MWM for shoulder flexion  technique 3 rounds x 10 during session followed by pt active flexion x 5 each round- not today  Body mechanics training for caregiver duties: sit to stand transfers, focused on scapular mm activation, neutral spine, standing on L side vs R side, also discussed use of a gait belt to assist   Manual Therapy:  15 mins- not today L shldr jt mobs- GH A/P and inferior glides Gr III/IV 30-40 sec bouts PROM in all planes MWM for flexion in sitting- clavicle/scapular assist x 10- not today Scapulothoracic mobs- all directions x 1 min, gr IV PROM flexion, abd, ER, IR, assessment of GH jt mobility  Not today MRE scapular depression and retraction S/L ER: 3 lbs 2x15 (  to fatigue) Prone row: 5 lb 2x15 (to fatigue) Prone Y: 0# 2x12 (to fatigue Serratus ant punches #3 2x12- not today Seated scapular retraction x15 with PT tactile cues- not today Scap retraction with GTB 5 second holds x 15, 2 sets   Seated thoracic extension over chair 2x10- reviewed for HEP Bicep curls GTB x 60 secs- not today W to Y on wall x 60 secs- not today HEP Lower and Mid traps activation x 60 secs each. - not today HEP MWM shoulder flexion with band post/inf glide on clavicle x10 (used blue TB in door)- showed pt how to perform this for HEP  Pt education for shoulder mechanics during shoulder height and overhead reach activities; cue to first achieve upright posture and then "keep thumb up" with reaching  PATIENT EDUCATION: Education details: PT POC/goals/HEP; Civil engineer, contracting for caregiver role Person educated: Patient Education method: Explanation Education comprehension: verbalized understanding  HOME EXERCISE PROGRAM: Access Code: [email or web address] URL: [email or web address] Date: 05/01/2023 Prepared by: Max Fickle  Exercises - Supine Shoulder Flexion Extension AAROM with Dowel  - 1 x daily - 7 x weekly - 3 sets - 10 reps - Scapular Retraction with Resistance  - 1 x daily - 7 x weekly - 3 sets  - 10 reps - Standing Shoulder Shrug with Resistance  - 1 x daily - 7 x weekly - 3 sets - 10 reps - Shoulder extension with resistance - Neutral  - 1 x daily - 7 x weekly - 3 sets - 10 reps - Standing Single Arm Elbow Flexion with Resistance  - 1 x daily - 7 x weekly - 3 sets - 10 reps - Standing Shoulder Horizontal Abduction with Resistance  - 1 x daily - 7 x weekly - 3 sets - 10 reps - Low Trap Setting at Wall  - 1 x daily - 7 x weekly - 3 sets - 10 reps - Seated Thoracic Lumbar Extension  - 3 x daily - 7 x weekly - 10 reps - Serratus Activation at Wall with Foam Roll and Resistance Band  - 1 x daily - 4 x weekly - 3 sets - 8 reps - Plank with Hands on Table  - 1 x daily - 3 x weekly - 3 sets - 30 hold  ASSESSMENT:  CLINICAL IMPRESSION: Pt is making progress towards outpatient physical therapy goals for his L shoulder.  Focused on strength emphasis for today's session in preparation for long term transition to independent sx management through HEP and gym exercise program.  Pt able to complete strengthening exercises without c/o increased shoulder pain.  Recommend continuing PT to further improve strength to facilitate return to PLOF including being able to carry, reach, lift, and perform caregiver duties without being limited by L shoulder pain.  Likely has not met maximum benefit from skilled PT; recommend continued tx at this time.   OBJECTIVE IMPAIRMENTS: decreased activity tolerance, decreased ROM, decreased strength, hypomobility, improper body mechanics, postural dysfunction, and pain.   ACTIVITY LIMITATIONS: carrying, lifting, reach over head, and caring for others  PARTICIPATION LIMITATIONS: cleaning, interpersonal relationship, community activity, yard work, and caregiver duties for family  PERSONAL FACTORS: Past/current experiences and Time since onset of injury/illness/exacerbation are also affecting patient's functional outcome.   REHAB POTENTIAL: Good  CLINICAL DECISION  MAKING: Stable/uncomplicated  EVALUATION COMPLEXITY: Low   GOALS: Goals reviewed with patient? Yes  SHORT TERM GOALS: Target date: 04/23/23  Pt will be able to perform HEP for shoulder ROM/strengthening 4x/week to  facilitate optimal long term outcome with PT Baseline: initiated today; 06/12/23: pt reports working on HEP daily Goal status: MET   LONG TERM GOALS: Target date: 07/10/23  Improve shoulder strength (abd, IR) 1/2 MMT grade to promote improved ability to lift and perform his daily activities as a caregiver without being limited by L shoulder pain Baseline: 4+/5 05/01/23: 4+/5; 06/12/23: 4+/5 Goal status: In progress  2.   Improve FOTO to 63 indicating pt able to perform daily activities without being limited by L shoulder pain Baseline: 43; 05/01/23: 59;  Goal status: In progress  3.  Improve L shoulder AROM flexion to >150 deg to be able to perform overhead activities without being limited by L shoulder pain Baseline: AROM 105, 05/01/23 150 deg; 06/12/23: 175 deg Goal status:met  4.  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

## 2023-07-12 ENCOUNTER — Ambulatory Visit: Payer: Medicaid Other

## 2023-07-19 ENCOUNTER — Ambulatory Visit: Payer: Medicaid Other

## 2023-07-19 DIAGNOSIS — M7542 Impingement syndrome of left shoulder: Secondary | ICD-10-CM

## 2023-07-19 DIAGNOSIS — M7522 Bicipital tendinitis, left shoulder: Secondary | ICD-10-CM

## 2023-07-19 DIAGNOSIS — M25512 Pain in left shoulder: Secondary | ICD-10-CM | POA: Diagnosis not present

## 2023-07-19 NOTE — Therapy (Signed)
 OUTPATIENT PHYSICAL THERAPY SHOULDER TREATMENT/PROGRESS NOTE/DC NOTE   Patient Name: Ian [name] MRN: [id number] DOB:[birth year], 43 y.o., male Today's Date: 07/19/2023  END OF SESSION:  PT End of Session - 07/19/23 1215     Visit Number 15    Number of Visits 19    Date for PT Re-Evaluation 07/10/23    Authorization Type PN/recert done on 06/12/23; additional 1-2x/week x 64 weeks (through 07/10/23)    Authorization Time Period requested 6 visits    PT Start Time 1200    PT Stop Time 1245    PT Time Calculation (min) 45 min    Activity Tolerance Patient tolerated treatment well    Behavior During Therapy [facility] for tasks assessed/performed              Past Medical History:  Diagnosis Date   Anxiety    Depression    Hypercholesteremia    Hypertension    Neuropathy    Panic disorder 03/24/2015   No past surgical history on file. 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   Depression, major, recurrent, in complete remission (HCC) 02/09/2015   Depression, major, recurrent, moderate (HCC) 02/09/2015    PCP: [facility]   REFERRING PROVIDER: Lanney Gins, PA-C  REFERRING DIAG: [phone] Pain in L shoulder  THERAPY DIAG:  Left shoulder pain, unspecified chronicity  Biceps tendonitis on left  Impingement syndrome of left shoulder  Rationale for Evaluation and Treatment: Rehabilitation  ONSET DATE: Early summer 2024  SUBJECTIVE:                                                                                                                                                                                      SUBJECTIVE  STATEMENT: Pt reports his L shoulder started bothering him this summer.  He started noticing a little catch in his shoulder.  He worked on building a fence and he was trying to use a chainsaw and his shoulder was sore after that.  There was no traumatic mechanics, no "pop".He went to ER and got some antiinflammatory treatment prednisone- it was helpful; he then noticed sx returned after the prednisone, saw his PCP and was referred to PT.  The place he was referred did not take his insurance so he went back to ER in October bc of the pain.  They did a toradol shot and  it was helpful.  He then went for a consult at [facility] in October.  Had x-rays (-) for fx.  He had a steroid shot at orthopedics- got some relief from that.  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 Yes   PRECAUTIONS: None  RED FLAGS: None   WEIGHT BEARING RESTRICTIONS: No  FALLS:  Has patient fallen in last 6 months? No  LIVING ENVIRONMENT: Lives with: lives with their family Lives in: House/apartment Stairs: yes, no issues Has following equipment at home: None  OCCUPATION: Not working  Currently helping with caregiving for his parents- has to physically lift and assist his Dad with transfers and mobility- he is the sore caregiver  He enjoys doing physical work at home   PLOF: Independent  PATIENT GOALS:to be able to perform his daily activities without being limited by L shoulder  pain  NEXT MD VISIT:   OBJECTIVE:  Note: Objective measures were completed at Evaluation unless otherwise noted.  DIAGNOSTIC FINDINGS:  X-rays of L shoulder- he states he was told he has some arthritis, maybe RTC tendonitis    PATIENT SURVEYS:  FOTO 43/63  COGNITION: Overall cognitive status: Within functional limits for tasks assessed     SENSATION: deferred  POSTURE: Forward head, rounded forward shoulders  UPPER EXTREMITY ROM:   Active ROM Right eval Left eval  Shoulder flexion 170 105 * PROM 145  Shoulder extension    Shoulder abduction    Shoulder adduction    Shoulder internal rotation HBB L  thumb to L2 HBB thumb to inf scap  Shoulder external rotation 85 45* PROM 65  Elbow flexion normal normal  Elbow extension normal normal  Wrist flexion    Wrist extension    Wrist ulnar deviation    Wrist radial deviation    Wrist pronation    Wrist supination    (Blank rows = not tested) *= pain UPPER EXTREMITY MMT:  MMT Right eval Left eval  Shoulder flexion 5/5 4+/5 *  Shoulder extension    Shoulder abduction  4+/5 *  Shoulder adduction    Shoulder internal rotation  4+/5 *  Shoulder external rotation  4+/5   Middle trapezius    Lower trapezius    Elbow flexion  4+/5 *  Elbow extension  4+/5  Wrist flexion    Wrist extension    Wrist ulnar deviation    Wrist radial deviation    Wrist pronation    Wrist supination    Grip strength (lbs)    (Blank rows = not tested)*= pain  SHOULDER SPECIAL TESTS: + painful arc L; + lateral jobe for pain and mild weakness L; (-) TTP proximal LH biceps tendon  JOINT MOBILITY TESTING:  Hypomobility L GH jt inf and AP glides  PALPATION:  Impaired scapulothoracic mm motor control- further assessment needed at next visit (+) MTP L infraspinatus, UT, pec maj  Cervical Spine screen: did not reproduce L shoulder pain with  cervical spine AROM and OP and (-) Spurling's R and L; deferred neural tension and full neuro  screen    TODAY'S TREATMENT:    DATE: 07/19/23 Subjective: Pt reports overall feeling like his shoulder is doing better; he plans to continue working on strengthening his shoulder.  Plans to continue independently after today.  Still having some feelings of "instability", but overall moving better and feeling a bit stronger.  Has not joined gym at this point, possibly in the future.  He plans to continue with strengthening at home and will f/u with MD if sx limit function moving forward.   Pain: 0/10 to start   Objective:                                                                                                                Pain: 1-2/10 at rest upon arrival  Treatment today:  L Flexion 175 (pain 150 and above) L abd 180 (pain 170 and above) HBB L T9 ER 67 deg with elbow at neutral and also shoulder at 90 deg abd  MMT: IR (at elbow neutral) 4+/5, (at 90 deg abd) 4-/5 ER (at 90 deg abd) 4-/5 Flex 4+/5 Abd-4+/5  Therapeutic Exercises:  UBE: 8 mins 4/4 F/B Scapular retraction with ER: blue TB x 20 MRE: isometrics IR/ER at  0 deg abd, and then 45 deg ab 5 second holds x 15 ea, and at 90 deg abd 5 sec holds x15 ea Rhythmic stab at 90 deg-120 deg flexion supine: 30 seconds x 3 Plank on elevated table (counter height): 30 second intervals x 3 Standing rows: 55# 2x15 Seated lat pull down: 65# 2x15 Standing triceps pull down: 45# 2x15 Chest press: 2x15- not today Standing shoulder extension: BTB 2x15 Standing rows: BTB 2x15 Step out RTC nautilus: 25# 2x12 ea direction IR/ER Serratus/scapular mm activation on foam roller at wall with red band: wall slide red band 8 reps, 3 sets- not today Body blade: elbow at side, f/b 30 second intervals x 3, IR/ER 30 second intervals x3- not today  Manual Therapy: 3 min, MWM for shoulder flexion technique 3 rounds x 10 during session followed by pt active flexion x 5 each round- not today  Body mechanics training for caregiver duties: sit to  stand transfers, focused on scapular mm activation, neutral spine, standing on L side vs R side, also discussed use of a gait belt to assist    Pt education for continuing HEP independently after today; exercise technique/tactile/verbal cues and instruction  PATIENT EDUCATION: Education details: PT POC/goals/HEP; Civil engineer, contracting for caregiver role Person educated: Patient Education method: Explanation Education comprehension: verbalized understanding  HOME EXERCISE PROGRAM: Access Code: [email or web address] URL: [email or web address] Date: 05/01/2023 Prepared by: Max Fickle  Exercises - Supine Shoulder Flexion Extension AAROM with Dowel  - 1 x daily - 7 x weekly - 3 sets - 10 reps - Scapular Retraction with Resistance  - 1 x daily - 7 x weekly - 3 sets - 10 reps - Standing Shoulder Shrug with Resistance  -  1 x daily - 7 x weekly - 3 sets - 10 reps - Shoulder extension with resistance - Neutral  - 1 x daily - 7 x weekly - 3 sets - 10 reps - Standing Single Arm Elbow Flexion with Resistance  - 1 x daily - 7 x weekly - 3 sets - 10 reps - Standing Shoulder Horizontal Abduction with Resistance  - 1 x daily - 7 x weekly - 3 sets - 10 reps - Low Trap Setting at Wall  - 1 x daily - 7 x weekly - 3 sets - 10 reps - Seated Thoracic Lumbar Extension  - 3 x daily - 7 x weekly - 10 reps - Serratus Activation at Wall with Foam Roll and Resistance Band  - 1 x daily - 4 x weekly - 3 sets - 8 reps - Plank with Hands on Table  - 1 x daily - 3 x weekly - 3 sets - 30 hold  ASSESSMENT:  CLINICAL IMPRESSION: Pt returns to PT after 2 week hiatus due to personal schedule and inclement weather related clinic cancellations.  Overall, he has reached likely plateau towards goals with skilled PT at this point in time.  Recommend continued emphasis on home strengthening and have discussed strategies for beginning a gym strengthening program for his shoulder if he chooses to do so.  At this point he  does continue to note feelings of "instability" and lacks full strength; but is managing fairly well as he is not currently participating in high physical demand occupational work at this time.  Would likely benefit from further strengthening/interventions if he does plan to resume this type of work in the future.  For now though, he has likely obtained max benefit from skilled PT.  DC to HEP.  OBJECTIVE IMPAIRMENTS: decreased activity tolerance, decreased ROM, decreased strength, hypomobility, improper body mechanics, postural dysfunction, and pain.   ACTIVITY LIMITATIONS: carrying, lifting, reach over head, and caring for others  PARTICIPATION LIMITATIONS: cleaning, interpersonal relationship, community activity, yard work, and caregiver duties for family  PERSONAL FACTORS: Past/current experiences and Time since onset of injury/illness/exacerbation are also affecting patient's functional outcome.   REHAB POTENTIAL: Good  CLINICAL DECISION MAKING: Stable/uncomplicated  EVALUATION COMPLEXITY: Low   GOALS: Goals reviewed with patient? Yes  SHORT TERM GOALS: Target date: 04/23/23  Pt will be able to perform HEP for shoulder ROM/strengthening 4x/week to facilitate optimal long term outcome with PT Baseline: initiated today; 06/12/23: pt reports working on HEP daily Goal status: MET   LONG TERM GOALS: Target date: 07/10/23  Improve shoulder strength (abd, IR) 1/2 MMT grade to promote improved ability to lift and perform his daily activities as a caregiver without being limited by L shoulder pain Baseline: 4+/5 05/01/23: 4+/5; 06/12/23: 4+/5 Goal status: plateau, 07/19/23 same  2.   Improve FOTO to 63 indicating pt able to perform daily activities without being limited by L shoulder pain Baseline: 43; 05/01/23: 59;  Goal status: In progress  3.  Improve L shoulder AROM flexion to >150 deg to be able to perform overhead activities without being limited by L shoulder pain Baseline: AROM 105,  05/01/23 150 deg; 06/12/23: 175 deg Goal status:met  4.  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 without being limited by L shoulder pain   Baseline: limited to 10 min intervals; 06/12/23: 15 min  Goal status: plateau; 07/19/23: same   PLAN:  PT FREQUENCY: 1-2x/week  PT DURATION: 8 weeks  PLANNED INTERVENTIONS: 97110-Therapeutic exercises, 97530- Therapeutic activity, [id number]- Neuromuscular re-education, 97535- Self Care, and [id number]- Manual therapy  PLAN FOR NEXT SESSION: D/C as pt will continue with independent HEP/strengthening program for sx management after today; discussed f/u with MD for further evaluation/intervention if not able to achieve functional goals long term when or if he returns to more manual occupational work  Max Fickle, PT, DPT, OCS  12:16 PM,07/19/23
